# Patient Record
Sex: Male | Born: 1950
Health system: Southern US, Community
[De-identification: ages and names within clinical notes are randomized; demographics above are authoritative.]

## PROBLEM LIST (undated history)

## (undated) DIAGNOSIS — N200 Calculus of kidney: Secondary | ICD-10-CM

## (undated) DIAGNOSIS — K5790 Diverticulosis of intestine, part unspecified, without perforation or abscess without bleeding: Secondary | ICD-10-CM

## (undated) DIAGNOSIS — I499 Cardiac arrhythmia, unspecified: Secondary | ICD-10-CM

## (undated) DIAGNOSIS — Z87442 Personal history of urinary calculi: Secondary | ICD-10-CM

## (undated) DIAGNOSIS — E785 Hyperlipidemia, unspecified: Secondary | ICD-10-CM

## (undated) DIAGNOSIS — I10 Essential (primary) hypertension: Secondary | ICD-10-CM

## (undated) DIAGNOSIS — Z9889 Other specified postprocedural states: Secondary | ICD-10-CM

## (undated) DIAGNOSIS — I509 Heart failure, unspecified: Secondary | ICD-10-CM

## (undated) HISTORY — DX: Diverticulosis of intestine, part unspecified, without perforation or abscess without bleeding: K57.90

## (undated) HISTORY — PX: CARDIAC DEFIBRILLATOR PLACEMENT: SHX171

## (undated) HISTORY — PX: LITHOTRIPSY: SUR834

## (undated) HISTORY — DX: Heart failure, unspecified: I50.9

## (undated) HISTORY — DX: Hyperlipidemia, unspecified: E78.5

## (undated) HISTORY — DX: Calculus of kidney: N20.0

## (undated) HISTORY — PX: OTHER SURGICAL HISTORY: SHX169

## (undated) HISTORY — DX: Essential (primary) hypertension: I10

---

## 1997-11-14 ENCOUNTER — Emergency Department (HOSPITAL_COMMUNITY): Admission: EM | Admit: 1997-11-14 | Discharge: 1997-11-14 | Payer: Self-pay | Admitting: Emergency Medicine

## 2004-09-05 ENCOUNTER — Ambulatory Visit: Payer: Self-pay | Admitting: Internal Medicine

## 2004-10-12 ENCOUNTER — Ambulatory Visit: Payer: Self-pay | Admitting: Internal Medicine

## 2009-05-01 ENCOUNTER — Encounter: Admission: RE | Admit: 2009-05-01 | Discharge: 2009-05-01 | Payer: Self-pay | Admitting: Otolaryngology

## 2011-09-27 ENCOUNTER — Encounter: Payer: Self-pay | Admitting: Internal Medicine

## 2012-07-09 DIAGNOSIS — Z720 Tobacco use: Secondary | ICD-10-CM | POA: Insufficient documentation

## 2012-09-09 ENCOUNTER — Encounter: Payer: Self-pay | Admitting: Internal Medicine

## 2012-12-14 ENCOUNTER — Other Ambulatory Visit: Payer: Self-pay | Admitting: Internal Medicine

## 2012-12-14 ENCOUNTER — Other Ambulatory Visit: Payer: Self-pay | Admitting: Family Medicine

## 2012-12-14 DIAGNOSIS — N1330 Unspecified hydronephrosis: Secondary | ICD-10-CM

## 2012-12-14 DIAGNOSIS — I714 Abdominal aortic aneurysm, without rupture: Secondary | ICD-10-CM

## 2012-12-28 ENCOUNTER — Other Ambulatory Visit: Payer: Self-pay

## 2012-12-29 ENCOUNTER — Other Ambulatory Visit: Payer: Self-pay

## 2016-03-24 ENCOUNTER — Encounter (HOSPITAL_COMMUNITY): Payer: Self-pay

## 2016-03-24 ENCOUNTER — Emergency Department (HOSPITAL_COMMUNITY): Payer: BLUE CROSS/BLUE SHIELD

## 2016-03-24 ENCOUNTER — Emergency Department (HOSPITAL_COMMUNITY)
Admission: EM | Admit: 2016-03-24 | Discharge: 2016-03-24 | Disposition: A | Discharge status: Home / Self Care | Payer: BLUE CROSS/BLUE SHIELD | Attending: Emergency Medicine | Admitting: Emergency Medicine

## 2016-03-24 DIAGNOSIS — I714 Abdominal aortic aneurysm, without rupture, unspecified: Secondary | ICD-10-CM

## 2016-03-24 DIAGNOSIS — N200 Calculus of kidney: Secondary | ICD-10-CM

## 2016-03-24 DIAGNOSIS — K8020 Calculus of gallbladder without cholecystitis without obstruction: Secondary | ICD-10-CM | POA: Diagnosis not present

## 2016-03-24 DIAGNOSIS — F17210 Nicotine dependence, cigarettes, uncomplicated: Secondary | ICD-10-CM | POA: Insufficient documentation

## 2016-03-24 DIAGNOSIS — R1032 Left lower quadrant pain: Secondary | ICD-10-CM

## 2016-03-24 DIAGNOSIS — Z7982 Long term (current) use of aspirin: Secondary | ICD-10-CM | POA: Diagnosis not present

## 2016-03-24 LAB — COMPREHENSIVE METABOLIC PANEL
ALK PHOS: 70 U/L (ref 38–126)
ALT: 14 U/L — ABNORMAL LOW (ref 17–63)
ANION GAP: 9 (ref 5–15)
AST: 13 U/L — ABNORMAL LOW (ref 15–41)
Albumin: 3.5 g/dL (ref 3.5–5.0)
BILIRUBIN TOTAL: 0.4 mg/dL (ref 0.3–1.2)
BUN: 21 mg/dL — ABNORMAL HIGH (ref 6–20)
CALCIUM: 8.5 mg/dL — AB (ref 8.9–10.3)
CO2: 22 mmol/L (ref 22–32)
Chloride: 108 mmol/L (ref 101–111)
Creatinine, Ser: 1.28 mg/dL — ABNORMAL HIGH (ref 0.61–1.24)
GFR, EST NON AFRICAN AMERICAN: 58 mL/min — AB (ref >60)
Glucose, Bld: 100 mg/dL — ABNORMAL HIGH (ref 65–99)
POTASSIUM: 3.7 mmol/L (ref 3.5–5.1)
Sodium: 139 mmol/L (ref 135–145)
TOTAL PROTEIN: 6.9 g/dL (ref 6.5–8.1)

## 2016-03-24 LAB — URINALYSIS, ROUTINE W REFLEX MICROSCOPIC
BILIRUBIN URINE: NEGATIVE
Glucose, UA: NEGATIVE mg/dL
Ketones, ur: NEGATIVE mg/dL
NITRITE: NEGATIVE
Protein, ur: 30 mg/dL — AB
SPECIFIC GRAVITY, URINE: 1.022 (ref 1.005–1.030)
pH: 6.5 (ref 5.0–8.0)

## 2016-03-24 LAB — CBC
HCT: 46.2 % (ref 39.0–52.0)
HEMOGLOBIN: 15.9 g/dL (ref 13.0–17.0)
MCH: 30.6 pg (ref 26.0–34.0)
MCHC: 34.4 g/dL (ref 30.0–36.0)
MCV: 89 fL (ref 78.0–100.0)
Platelets: 295 10*3/uL (ref 150–400)
RBC: 5.19 MIL/uL (ref 4.22–5.81)
RDW: 13.9 % (ref 11.5–15.5)
WBC: 9.3 10*3/uL (ref 4.0–10.5)

## 2016-03-24 LAB — URINE MICROSCOPIC-ADD ON

## 2016-03-24 LAB — LIPASE, BLOOD: Lipase: 37 U/L (ref 11–51)

## 2016-03-24 MED ORDER — HYDROCODONE-ACETAMINOPHEN 5-325 MG PO TABS: 1.0000 | ORAL_TABLET | ORAL | 0 refills | Status: DC | PRN

## 2016-03-24 NOTE — ED Notes (Signed)
Pt is eating in lobby

## 2016-03-24 NOTE — ED Triage Notes (Signed)
Patient complains of left sided abdominal pain since Saturday, nausea with same. States that he has kidney stones and thinks the pain is related to same. Also has diverticulosis so unsure, no dysuria. Alert and oriented.

## 2016-03-24 NOTE — Discharge Instructions (Signed)
Get plenty of rest and drink a lot of fluids.  To avoid constipation; take Colace twice a day while you are using narcotic pain reliever.

## 2016-03-25 ENCOUNTER — Encounter: Payer: Self-pay | Admitting: Surgery

## 2016-03-25 ENCOUNTER — Ambulatory Visit (INDEPENDENT_AMBULATORY_CARE_PROVIDER_SITE_OTHER): Payer: BLUE CROSS/BLUE SHIELD | Admitting: Surgery

## 2016-03-25 ENCOUNTER — Ambulatory Visit (HOSPITAL_COMMUNITY)
Admission: RE | Admit: 2016-03-25 | Discharge: 2016-03-25 | Disposition: A | Discharge status: Home / Self Care | Payer: BLUE CROSS/BLUE SHIELD | Source: Ambulatory Visit | Attending: Surgery | Admitting: Surgery

## 2016-03-25 ENCOUNTER — Telehealth: Payer: Self-pay

## 2016-03-25 VITALS — BP 153/109 | HR 74 | Temp 99.1°F | Resp 16 | Ht 74.0 in | Wt 227.0 lb

## 2016-03-25 DIAGNOSIS — I714 Abdominal aortic aneurysm, without rupture, unspecified: Secondary | ICD-10-CM

## 2016-03-25 DIAGNOSIS — K5730 Diverticulosis of large intestine without perforation or abscess without bleeding: Secondary | ICD-10-CM

## 2016-03-25 DIAGNOSIS — N200 Calculus of kidney: Secondary | ICD-10-CM

## 2016-03-25 DIAGNOSIS — R1032 Left lower quadrant pain: Secondary | ICD-10-CM

## 2016-03-25 DIAGNOSIS — I700 Atherosclerosis of aorta: Secondary | ICD-10-CM | POA: Insufficient documentation

## 2016-03-25 MED ORDER — IOPAMIDOL (ISOVUE-370) INJECTION 76%
100.0000 mL | Freq: Once | INTRAVENOUS | Status: AC | PRN
  Administered 2016-03-25: 100 mL via INTRAVENOUS

## 2016-03-25 MED ORDER — ATORVASTATIN CALCIUM 10 MG PO TABS: 10.0000 mg | ORAL_TABLET | Freq: Every day | ORAL | 5 refills | Status: DC

## 2016-03-25 NOTE — Telephone Encounter (Signed)
rec'd call from pt.  Reported he went to the ER yesterday for abdominal pain and was diagnosed with an enlarged AAA.  Per the pt., the AAA was 3.2 cm, and is now 5.3 cm.  Stated he has had left lower quadrant abdominal pain for 3-4 weeks, and has been taking Tylenol and sleeping on a heating pad.  Stated "I'm tired of being in pain.  Discussed with Dr. Trula Slade.  Recommended to schedule a CTA chest/ abd./ pelvis today and work in to the office schedule.  Pt. Advised of recommendations.  Advised not to eat any solid foods at this time, until after the scan.  Stated he last ate at 7:30 AM  Verb. Understanding.

## 2016-03-25 NOTE — Telephone Encounter (Signed)
Sched CTAs at [employer] at 2:30. Sched [facility] at 4:00. Spoke to pt to inform him of appts.

## 2016-03-25 NOTE — Progress Notes (Signed)
[facility] [place]  Patient name: Anthony Pierce MRN: [id number] DOB: 1950-11-15 Sex: male  REFERRING PHYSICIAN: Dr. Eulis Foster  REASON FOR CONSULT: AAA  HPI: Anthony Pierce is a 65 y.o. male, who is referred today for evaluation of an abdominal aortic aneurysm.  The patient states that he has been having abdominal pain for proximal 7-8 months.  It has gotten worse.  He recently underwent a CT scan to evaluate for a kidney stone and it was found that his aneurysm has increased significantly in size.  Approximately 3 years ago it measured 3.2 cm.  He was seen by vascular surgery and once the [facility] but did not seek follow-up.  Most recent CT scan shows an increase in size up to 5.3 cm.  Patient denies any nausea or vomiting.  The patient has a history of diverticulosis as well as kidney stones.  He is a current smoker but would like to quit.  He denies any symptoms of chest pain or exertional dyspnea.  He denies claudication symptoms.  He has never had a stroke or neurologic symptoms.  Past Medical History:  Diagnosis Date  . Diverticulosis   . Hemorrhoids   . Kidney stones     No family history on file.  SOCIAL HISTORY: Social History   Social History  . Marital status: Single    Spouse name: N/A  . Number of children: N/A  . Years of education: N/A   Occupational History  . Not on file.   Social History Main Topics  . Smoking status: Current Every Day Smoker    Types: Cigarettes  . Smokeless tobacco: Never Used     Comment: 1 pk per day  . Alcohol use Not on file  . Drug use: Unknown  . Sexual activity: Not on file   Other Topics Concern  . Not on file   Social History Narrative  . No narrative on file    Allergies  Allergen Reactions  . Penicillins Rash    Has patient had a PCN reaction causing immediate rash, facial/tongue/throat swelling, SOB or lightheadedness with hypotension: Yes Has patient had a PCN  reaction causing severe rash involving mucus membranes or skin necrosis: No Has patient had a PCN reaction that required hospitalization No Has patient had a PCN reaction occurring within the last 10 years: No If all of the above answers are "NO", then may proceed with Cephalosporin use.     Current Outpatient Prescriptions  Medication Sig Dispense Refill  . acetaminophen (TYLENOL) 500 MG tablet Take 500-1,000 mg by mouth every 6 (six) hours as needed (for pain).    Marland Kitchen aspirin EC 81 MG tablet Take 81 mg by mouth daily.    Marland Kitchen HYDROcodone-acetaminophen (NORCO) 5-325 MG tablet Take 1 tablet by mouth every 4 (four) hours as needed. 20 tablet 0   No current facility-administered medications for this visit.     REVIEW OF SYSTEMS:  [X]  denotes positive finding, [ ]  denotes negative finding Cardiac  Comments:  Chest pain or chest pressure:    Shortness of breath upon exertion:    Short of breath when lying flat:    Irregular heart rhythm:        Vascular    Pain in calf, thigh, or hip brought on by ambulation:    Pain in feet at night that wakes you up from your sleep:     Blood clot in your veins:    Leg swelling:  Pulmonary    Oxygen at home:    Productive cough:     Wheezing:         Neurologic    Sudden weakness in arms or legs:     Sudden numbness in arms or legs:     Sudden onset of difficulty speaking or slurred speech:    Temporary loss of vision in one eye:     Problems with dizziness:         Gastrointestinal    Blood in stool:     Vomited blood:         Genitourinary    Burning when urinating:     Blood in urine:        Psychiatric    Major depression:         Hematologic    Bleeding problems:    Problems with blood clotting too easily:        Skin    Rashes or ulcers:        Constitutional    Fever or chills:      PHYSICAL EXAM: Vitals:   03/25/16 1615 03/25/16 1619  BP: (!) 160/113 (!) 153/109  Pulse: 74   Resp: 16   Temp: 99.1 F (37.3  C)   TempSrc: Oral   SpO2: 97%   Weight: 227 lb (103 kg)   Height: 6\' 2"  (1.88 m)     GENERAL: The patient is a well-nourished male, in no acute distress. The vital signs are documented above. CARDIAC: There is a regular rate and rhythm.  VASCULAR: Pulsatile abdominal mass which is minimally tender.  The tenderness is more inferior and lateral to the prominent pulse of the aneurysm.  He has palpable pedal pulses and no carotid bruits. PULMONARY: There is good air exchange bilaterally without wheezing or rales. ABDOMEN: Soft and non-tender with normal pitched bowel sounds.  MUSCULOSKELETAL: There are no major deformities or cyanosis. NEUROLOGIC: No focal weakness or paresthesias are detected. SKIN: There are no ulcers or rashes noted. PSYCHIATRIC: The patient has a normal affect.  DATA:  I have reviewed his CT scan with the following findings: 1. Ectatic abdominal aorta with fusiform aneurysm of the abdominal aorta just above the bifurcation measuring 5 cm x 5 cm. 2. Iliac arteries are non aneurysmal. 3. Minimal atherosclerotic calcification. Soft intimal plaque and thrombus throughout the abdominal aorta. 4. 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 5. LEFT nephrolithiasis 6. Extensive LEFT colon diverticulosis without diverticulitis.  ASSESSMENT AND PLAN: AAA:  I discussed with the patient that I do not feel his aneurysm is the source of his abdominal pain.  However, because of the significant increase in size over 2 years as well as the patient's anxiety regarding his aneurysm, we have elected to proceed with endovascular repair.  Prior to doing so he will need to have formal cardiac clearance.  I also want him to see urology regarding his renal stone.  He will get preoperative carotid duplex, lower extremity duplex, and ABIs.  I discussed the risks and benefits of the operation which include but are not limited to death, cardiac, and  complications, intestinal ischemia, renal insufficiency, distal lower extremity ischemia.  All of his questions were answered.  We have placed on the schedule for Wednesday, November 8  Because his vascular disease, I'm starting the patient on a statin today.  I will call in a prescription for Lipitor 10 mg.  He'll also be encouraged  to take a baby aspirin daily and discontinue smoking.   Annamarie Major, MD [facility] [phone] Pager [phone]

## 2016-03-26 ENCOUNTER — Emergency Department (HOSPITAL_COMMUNITY): Payer: BLUE CROSS/BLUE SHIELD

## 2016-03-26 ENCOUNTER — Other Ambulatory Visit: Payer: Self-pay | Admitting: *Deleted

## 2016-03-26 ENCOUNTER — Telehealth: Payer: Self-pay

## 2016-03-26 ENCOUNTER — Inpatient Hospital Stay (HOSPITAL_COMMUNITY)
Admission: EM | Admit: 2016-03-26 | Discharge: 2016-03-28 | DRG: 269 | Disposition: A | Discharge status: Home / Self Care | Payer: BLUE CROSS/BLUE SHIELD | Attending: Surgery | Admitting: Surgery

## 2016-03-26 ENCOUNTER — Encounter (HOSPITAL_COMMUNITY): Payer: Self-pay | Admitting: *Deleted

## 2016-03-26 ENCOUNTER — Other Ambulatory Visit: Payer: Self-pay

## 2016-03-26 DIAGNOSIS — N23 Unspecified renal colic: Secondary | ICD-10-CM

## 2016-03-26 DIAGNOSIS — I714 Abdominal aortic aneurysm, without rupture, unspecified: Secondary | ICD-10-CM | POA: Diagnosis present

## 2016-03-26 DIAGNOSIS — Z8679 Personal history of other diseases of the circulatory system: Secondary | ICD-10-CM

## 2016-03-26 DIAGNOSIS — N390 Urinary tract infection, site not specified: Secondary | ICD-10-CM | POA: Diagnosis present

## 2016-03-26 DIAGNOSIS — K5792 Diverticulitis of intestine, part unspecified, without perforation or abscess without bleeding: Secondary | ICD-10-CM | POA: Diagnosis present

## 2016-03-26 DIAGNOSIS — R109 Unspecified abdominal pain: Secondary | ICD-10-CM | POA: Diagnosis not present

## 2016-03-26 DIAGNOSIS — Z9889 Other specified postprocedural states: Secondary | ICD-10-CM

## 2016-03-26 DIAGNOSIS — T508X5A Adverse effect of diagnostic agents, initial encounter: Secondary | ICD-10-CM | POA: Diagnosis not present

## 2016-03-26 DIAGNOSIS — N179 Acute kidney failure, unspecified: Secondary | ICD-10-CM | POA: Diagnosis not present

## 2016-03-26 DIAGNOSIS — N200 Calculus of kidney: Secondary | ICD-10-CM | POA: Diagnosis present

## 2016-03-26 DIAGNOSIS — F17200 Nicotine dependence, unspecified, uncomplicated: Secondary | ICD-10-CM | POA: Diagnosis present

## 2016-03-26 LAB — COMPREHENSIVE METABOLIC PANEL
ALBUMIN: 3.7 g/dL (ref 3.5–5.0)
ALT: 12 U/L — ABNORMAL LOW (ref 17–63)
ANION GAP: 8 (ref 5–15)
AST: 16 U/L (ref 15–41)
Alkaline Phosphatase: 65 U/L (ref 38–126)
BUN: 17 mg/dL (ref 6–20)
CALCIUM: 8.8 mg/dL — AB (ref 8.9–10.3)
CO2: 22 mmol/L (ref 22–32)
Chloride: 107 mmol/L (ref 101–111)
Creatinine, Ser: 1.14 mg/dL (ref 0.61–1.24)
GFR calc non Af Amer: >60 mL/min (ref >60)
GLUCOSE: 152 mg/dL — AB (ref 65–99)
POTASSIUM: 4.1 mmol/L (ref 3.5–5.1)
SODIUM: 137 mmol/L (ref 135–145)
TOTAL PROTEIN: 6.8 g/dL (ref 6.5–8.1)
Total Bilirubin: 0.5 mg/dL (ref 0.3–1.2)

## 2016-03-26 LAB — CBC WITH DIFFERENTIAL/PLATELET
BASOS ABS: 0 10*3/uL (ref 0.0–0.1)
BASOS PCT: 0 %
Eosinophils Absolute: 0 10*3/uL (ref 0.0–0.7)
Eosinophils Relative: 0 %
HEMATOCRIT: 45.7 % (ref 39.0–52.0)
HEMOGLOBIN: 15.8 g/dL (ref 13.0–17.0)
LYMPHS PCT: 14 %
Lymphs Abs: 1.8 10*3/uL (ref 0.7–4.0)
MCH: 30.5 pg (ref 26.0–34.0)
MCHC: 34.6 g/dL (ref 30.0–36.0)
MCV: 88.2 fL (ref 78.0–100.0)
MONO ABS: 0.5 10*3/uL (ref 0.1–1.0)
MONOS PCT: 4 %
NEUTROS ABS: 10.1 10*3/uL — AB (ref 1.7–7.7)
NEUTROS PCT: 82 %
Platelets: 253 10*3/uL (ref 150–400)
RBC: 5.18 MIL/uL (ref 4.22–5.81)
RDW: 13.8 % (ref 11.5–15.5)
WBC: 12.4 10*3/uL — ABNORMAL HIGH (ref 4.0–10.5)

## 2016-03-26 LAB — LIPASE, BLOOD: Lipase: 70 U/L — ABNORMAL HIGH (ref 11–51)

## 2016-03-26 LAB — URINALYSIS, ROUTINE W REFLEX MICROSCOPIC
Bilirubin Urine: NEGATIVE
GLUCOSE, UA: NEGATIVE mg/dL
Ketones, ur: NEGATIVE mg/dL
Nitrite: NEGATIVE
Protein, ur: NEGATIVE mg/dL
SPECIFIC GRAVITY, URINE: 1.008 (ref 1.005–1.030)
pH: 6 (ref 5.0–8.0)

## 2016-03-26 LAB — URINE MICROSCOPIC-ADD ON

## 2016-03-26 MED ORDER — MORPHINE SULFATE (PF) 4 MG/ML IV SOLN
4.0000 mg | Freq: Once | INTRAVENOUS | Status: AC
Start: 2016-03-26 — End: 2016-03-26
  Administered 2016-03-26: 4 mg via INTRAVENOUS
  Filled 2016-03-26: qty 1

## 2016-03-26 NOTE — ED Notes (Signed)
Patient ambulated to BR with even, steady gait for urine sample.

## 2016-03-26 NOTE — Consult Note (Signed)
Consult Note  Patient name: Anthony Pierce MRN: [id number] DOB: 10/23/1950 Sex: male  Consulting Physician:  ER  Reason for Consult:  Chief Complaint  Patient presents with  . Abdominal Pain    HISTORY OF PRESENT ILLNESS: 65 year old male who I saw in the office yesterday for a AAA.Marland Kitchen  He called the office today with worsening abdominal pain and was told to goto the ER.  He took a pain pill and the pain improved somewhat. He saw urology today who doesn't think his symptoms are related to his renal stone.  He does have a history of diverticulitis.  He recently underwent a CT scan to evaluate for a kidney stone and it was found that his aneurysm has increased significantly in size.  Approximately 3 years ago it measured 3.2 cm.  He was seen by vascular surgery and once the [facility] but did not seek follow-up.  Most recent CT scan shows an increase in size up to 5.3 cm.  I started him on a statin yesterday.  He continues to smoke  Past Medical History:  Diagnosis Date  . Diverticulosis   . Hemorrhoids   . Kidney stones     History reviewed. No pertinent surgical history.  Social History   Social History  . Marital status: Single    Spouse name: N/A  . Number of children: N/A  . Years of education: N/A   Occupational History  . Not on file.   Social History Main Topics  . Smoking status: Current Every Day Smoker    Types: Cigarettes  . Smokeless tobacco: Never Used     Comment: 1 pk per day  . Alcohol use Not on file  . Drug use: Unknown  . Sexual activity: Not on file   Other Topics Concern  . Not on file   Social History Narrative  . No narrative on file    History reviewed. No pertinent family history.  Allergies as of 03/26/2016 - Review Complete 03/26/2016  Allergen Reaction Noted  . Penicillins Rash 03/24/2016    No current facility-administered medications on file prior to encounter.    Current Outpatient Prescriptions on File Prior to Encounter    Medication Sig Dispense Refill  . acetaminophen (TYLENOL) 500 MG tablet Take 500-1,000 mg by mouth every 6 (six) hours as needed (for pain).    Marland Kitchen aspirin EC 81 MG tablet Take 81 mg by mouth daily.    Marland Kitchen atorvastatin (LIPITOR) 10 MG tablet Take 1 tablet (10 mg total) by mouth daily. 30 tablet 5  . HYDROcodone-acetaminophen (NORCO) 5-325 MG tablet Take 1 tablet by mouth every 4 (four) hours as needed. 20 tablet 0     REVIEW OF SYSTEMS: Cardiovascular: No chest pain, chest pressure, palpitations, orthopnea, or dyspnea on exertion. No claudication or rest pain,  No history of DVT or phlebitis. Pulmonary: No productive cough, asthma or wheezing. Neurologic: No weakness, paresthesias, aphasia, or amaurosis. No dizziness. Hematologic: No bleeding problems or clotting disorders. Musculoskeletal: No joint pain or joint swelling. Gastrointestinal: No blood in stool or hematemesis.  History of diverticulitis Genitourinary: history of renal stones since age 65 Psychiatric:: No history of major depression. Integumentary: No rashes or ulcers. Constitutional: No fever or chills.  PHYSICAL EXAMINATION: General: The patient appears their stated age.  Vital signs are BP (!) 161/107   Pulse 81   Temp 98.1 F (36.7 C) (Oral)   Resp 15   SpO2 97%  Pulmonary: Respirations are non-labored HEENT:  No gross abnormalities Abdomen: tender in left lower quadrant.  Palpation of most of his aorta does is not painful, except in the left lower quadrant Musculoskeletal: There are no major deformities.   Neurologic: No focal weakness or paresthesias are detected, Skin: There are no ulcer or rashes noted. Psychiatric: The patient has normal affect. Cardiovascular: There is a regular rate and rhythm without significant murmur appreciated.  Palpable pedal pulses  Diagnostic Studies: A non-contrast CT scan was done which I discussed with radiology.  There has been no change in his aoric size, and there is no  peri-aortic stranding.  He does have a large proximal renal stone with mild ureteral dialtion    Assessment:  AAA Plan: I do not believe his abdominal pain is related to his AAA as he is not tender over his aneurysm except in his left lower quadrant.  His CT scan does not show diverticulitis.  I am concerned that his pain may be related to his stone, but urology earlier today did not feel that was the case.  He will probably need to have his aneurysm repaired  During this hospital stay if nothing else because of the size, and to take it out of the equation for the cause of his abdominal pain.  He will need to have a UA to make sure he does not have any evidence of infection.  I discussed the details of the repair with him yesterday in the office.  I would recommend admission by medicine for abx and monitoring.  Keep him NPO after midnight in case repair of his AAA is scheduled for tomorrow     V. Leia Alf, M.D. [facility] [place] Office: [phone] Pager:  [phone]

## 2016-03-26 NOTE — Telephone Encounter (Signed)
Phone call from pt.  Reported onset of left sided abdominal pain from stomach region down to genital region.  Reported pain at 10/10.  C/o nausea and breaking out in cold sweat.  Denied back pain.  Reported he took a pain pill about 30 minutes ago, and pain level has decreased to 8-9/10.   Pt. Stated he spoke with a Urologist and was told he did not think the pain was related to kidney stone.  Advised pt. With known AAA, he should go to the ER.  Advised that a family member should drive him.  Pt. Reported he will call his daughter to drive him.  Advised that this nurse will make Dr. Trula Slade aware of pt. Going to the ER.

## 2016-03-26 NOTE — ED Triage Notes (Signed)
Pt c/o sudden sharp left side abdominal pain this afternoon starting around 1600. Pt was recent dx with kidney stones and AAA. Pt took a Norco about 30 min prior to ED arrival. Pt did have some relief. Pt denies dysuria or painful urination.

## 2016-03-26 NOTE — ED Provider Notes (Signed)
[facility] DEPT Provider Note   CSN: [id number] Arrival date & time: 03/26/16  1748     History   Chief Complaint Chief Complaint  Patient presents with  . Abdominal Pain    HPI Anthony Pierce is a 65 y.o. male.Complains of left-sided abdominal pain radiating to left testicle onset 7 or 8 months ago, becoming worse 2 days ago. Pain is worse with changing positions improved with remaining still.Marland Kitchen He was evaluated in the emergency department at [facility] 2 days ago, determined to have bilateral nephrolithiasis and 5.3 cm infrarenal aortic aneurysm  As,, as well as cholelithiasis. Discharged with prescription for Norco. He took one earlier today with partial relief. No other associated symptoms. No nausea or vomiting no fever no other associated symptoms at seen by Dr. Trula Slade yesterday and again today. Concern for abdominal aortic aneurysm. Dr. Angelica Pou request repeat CT scan and lab work area and HPI  Past Medical History:  Diagnosis Date  . Diverticulosis   . Hemorrhoids   . Kidney stones   Aortic aneurysm   There are no active problems to display for this patient.   History reviewed. No pertinent surgical history.     Home Medications    Prior to Admission medications   Medication Sig Start Date End Date Taking? Authorizing Provider  acetaminophen (TYLENOL) 500 MG tablet Take 500-1,000 mg by mouth every 6 (six) hours as needed (for pain).    Historical Provider, MD  aspirin EC 81 MG tablet Take 81 mg by mouth daily.    Historical Provider, MD  atorvastatin (LIPITOR) 10 MG tablet Take 1 tablet (10 mg total) by mouth daily. 03/25/16   Serafina Mitchell, MD  HYDROcodone-acetaminophen (NORCO) 5-325 MG tablet Take 1 tablet by mouth every 4 (four) hours as needed. 03/24/16   Daleen Bo, MD    Family History History reviewed. No pertinent family history.  Social History Social History  Substance Use Topics  . Smoking status: Current Every Day Smoker   Types: Cigarettes  . Smokeless tobacco: Never Used     Comment: 1 pk per day  . Alcohol use Not on file     Allergies   Penicillins   Review of Systems Review of Systems  Constitutional: Negative.   HENT: Negative.   Respiratory: Negative.   Gastrointestinal: Positive for abdominal pain.  Genitourinary: Positive for testicular pain.  Musculoskeletal: Negative.   Skin: Negative.   Allergic/Immunologic: Negative.   Neurological: Negative.   Psychiatric/Behavioral: Negative.   All other systems reviewed and are negative.    Physical Exam Updated Vital Signs BP (!) 161/107   Pulse 81   Temp 98.1 F (36.7 C) (Oral)   Resp 15   SpO2 97%   Physical Exam  Constitutional: He appears well-developed and well-nourished.  HENT:  Head: Normocephalic and atraumatic.  Eyes: Conjunctivae are normal. Pupils are equal, round, and reactive to light.  Neck: Neck supple. No tracheal deviation present. No thyromegaly present.  Cardiovascular: Normal rate and regular rhythm.   No murmur heard. Pulmonary/Chest: Effort normal and breath sounds normal.  Abdominal: Soft. Bowel sounds are normal. He exhibits no distension and no mass. There is tenderness. There is no rebound. No hernia.  Mildly tender left lower quadrant  Genitourinary: Penis normal.  Genitourinary Comments: Scrotum normal  Musculoskeletal: Normal range of motion. He exhibits no edema or tenderness.  Neurological: He is alert. Coordination normal.  Skin: Skin is warm and dry. No rash noted.  Psychiatric: He has a  normal mood and affect.  Nursing note and vitals reviewed.    ED Treatments / Results  Labs (all labs ordered are listed, but only abnormal results are displayed) Labs Reviewed  COMPREHENSIVE METABOLIC PANEL  CBC WITH DIFFERENTIAL/PLATELET  LIPASE, BLOOD    EKG  EKG Interpretation  Date/Time:  Tuesday March 26 2016 23:37:48 EDT Ventricular Rate:  66 PR Interval:    QRS Duration: 95 QT  Interval:  393 QTC Calculation: 412 R Axis:   52 Text Interpretation:  Sinus rhythm Repol abnrm suggests ischemia, anterolateral No old tracing to compare Confirmed by Winfred Leeds  MD, Brieanne Mignone [phone]) on 03/26/2016 11:41:33 PM      Results for orders placed or performed during the hospital encounter of 03/26/16  Comprehensive metabolic panel  Result Value Ref Range   Sodium 137 135 - 145 mmol/L   Potassium 4.1 3.5 - 5.1 mmol/L   Chloride 107 101 - 111 mmol/L   CO2 22 22 - 32 mmol/L   Glucose, Bld 152 (H) 65 - 99 mg/dL   BUN 17 6 - 20 mg/dL   Creatinine, Ser 1.14 0.61 - 1.24 mg/dL   Calcium 8.8 (L) 8.9 - 10.3 mg/dL   Total Protein 6.8 6.5 - 8.1 g/dL   Albumin 3.7 3.5 - 5.0 g/dL   AST 16 15 - 41 U/L   ALT 12 (L) 17 - 63 U/L   Alkaline Phosphatase 65 38 - 126 U/L   Total Bilirubin 0.5 0.3 - 1.2 mg/dL   GFR calc non Af Amer >60 >60 mL/min   GFR calc Af Amer >60 >60 mL/min   Anion gap 8 5 - 15  CBC with Differential/Platelet  Result Value Ref Range   WBC 12.4 (H) 4.0 - 10.5 K/uL   RBC 5.18 4.22 - 5.81 MIL/uL   Hemoglobin 15.8 13.0 - 17.0 g/dL   HCT 45.7 39.0 - 52.0 %   MCV 88.2 78.0 - 100.0 fL   MCH 30.5 26.0 - 34.0 pg   MCHC 34.6 30.0 - 36.0 g/dL   RDW 13.8 11.5 - 15.5 %   Platelets 253 150 - 400 K/uL   Neutrophils Relative % 82 %   Neutro Abs 10.1 (H) 1.7 - 7.7 K/uL   Lymphocytes Relative 14 %   Lymphs Abs 1.8 0.7 - 4.0 K/uL   Monocytes Relative 4 %   Monocytes Absolute 0.5 0.1 - 1.0 K/uL   Eosinophils Relative 0 %   Eosinophils Absolute 0.0 0.0 - 0.7 K/uL   Basophils Relative 0 %   Basophils Absolute 0.0 0.0 - 0.1 K/uL  Lipase, blood  Result Value Ref Range   Lipase 70 (H) 11 - 51 U/L  Urinalysis, Routine w reflex microscopic (not at [facility])  Result Value Ref Range   Color, Urine YELLOW YELLOW   APPearance CLEAR CLEAR   Specific Gravity, Urine 1.008 1.005 - 1.030   pH 6.0 5.0 - 8.0   Glucose, UA NEGATIVE NEGATIVE mg/dL   Hgb urine dipstick MODERATE (A) NEGATIVE    Bilirubin Urine NEGATIVE NEGATIVE   Ketones, ur NEGATIVE NEGATIVE mg/dL   Protein, ur NEGATIVE NEGATIVE mg/dL   Nitrite NEGATIVE NEGATIVE   Leukocytes, UA SMALL (A) NEGATIVE  Urine microscopic-add on  Result Value Ref Range   Squamous Epithelial / LPF 0-5 (A) NONE SEEN   WBC, UA 6-30 0 - 5 WBC/hpf   RBC / HPF 6-30 0 - 5 RBC/hpf   Bacteria, UA RARE (A) NONE SEEN   Ct Abdomen Pelvis Wo  Contrast  Result Date: 03/26/2016 CLINICAL DATA:  65 year old male with left-sided abdominal and pelvic pain. EXAM: CT ABDOMEN AND PELVIS WITHOUT CONTRAST TECHNIQUE: Multidetector CT imaging of the abdomen and pelvis was performed following the standard protocol without IV contrast. COMPARISON:  CT dated 03/25/2016 and 03/24/2016 FINDINGS: Evaluation of this exam is limited in the absence of intravenous contrast. Lower chest: The visualized lung bases are clear. No intra-abdominal free air or free fluid. Hepatobiliary: The liver is unremarkable. The gallbladder is filled with stones. There is no pericholecystic fluid. No biliary ductal dilatation. No calcified stone noted within the CBD. Ultrasound may provide better evaluation of the gallbladder clinically indicated. Pancreas: Unremarkable. No pancreatic ductal dilatation or surrounding inflammatory changes. Spleen: Normal in size without focal abnormality. Adrenals/Urinary Tract: The adrenal glands appear unremarkable. There is a 12 x 14 mm partially obstructing stone in the left renal pelvis with mild left hydronephrosis. There is mild haziness of the left renal hilar fat. Correlation with urinalysis recommended to evaluate for superimposed UTI. Multiple other nonobstructing left renal stones measuring up to 10 mm in the interpolar aspect of the left kidney noted. There are two nonobstructing stones in the right kidney measuring up to 9 mm. There is no hydronephrosis on the right. Two adjacent hypodense lesions in the inferior pole of the right kidney are not well  characterized on this noncontrast study but appears similar to prior CT and most likely represent cysts. The visualized ureters appear unremarkable. The urinary bladder is predominantly collapsed. There is apparent diffuse thickening of the bladder wall which may be partly related to underdistention. Cystitis is not excluded. Correlation with urinalysis recommended. Stomach/Bowel: There is extensive sigmoid diverticulosis with muscular hypertrophy. Diffuse diverticulosis of the descending colon. No active inflammatory changes. There is no evidence of bowel obstruction for active inflammation. Normal appendix. Vascular/Lymphatic: The aorta is tortuous. Sign type of There is a fusiform infrarenal abdominal aortic aneurysm measuring 5.3 cm in greatest axial diameter. This aneurysm measures similar in size to the study of 03/25/2016 and 03/24/2016 by my measurement. The aneurysm measures approximately 7-8 cm in craniocaudal length and extends to the level of the bifurcation. There is mural thrombus/hematoma within the wall of the aneurysm. There is no periaortic stranding or inflammatory changes. Evaluation of the aorta and vasculature is however limited on this noncontrast study. No portal venous gas identified. There is no adenopathy. Reproductive: The prostate and seminal vesicles are grossly unremarkable. Other: Small fat containing umbilical hernia. Musculoskeletal: There bilateral L5 pars defects with grade I L5-S1 anterolisthesis. L5-S1 disc desiccation with vacuum phenomena. No acute fracture. IMPRESSION: Obstructing calculus in the left renal pelvis with mild left hydronephrosis. Correlation with urinalysis recommended to exclude superimposed UTI. Multiple no other nonobstructing bilateral renal calculi noted. There is no hydronephrosis on the right. Fusiform infrarenal abdominal aortic aneurysm measuring 5.3 cm in greatest transverse axial dimension similar to the prior CT. There is mural thrombus or hematoma  along the wall of the aneurysm. No periaortic stranding or evidence of inflammatory changes identified by CT. Evaluation however is limited in the absence of intravenous contrast. Cholelithiasis. Extensive distal colonic diverticulosis without active inflammatory changes. No evidence of bowel obstruction. Normal appendix. These results were called by telephone at the time of interpretation on 03/26/2016 at 10:06 pm to Dr. Trula Slade, who verbally acknowledged these results. Electronically Signed   By: Anner Crete M.D.   On: 03/26/2016 22:35   Ct Angio Chest Aorta W &/or Wo Contrast  Result Date: 03/25/2016  CLINICAL DATA:  Abdominal aortic aneurysm. LEFT lower quadrant pain. EXAM: CT ANGIOGRAPHY CHEST, ABDOMEN AND PELVIS TECHNIQUE: Multidetector CT imaging through the chest, abdomen and pelvis was performed using the standard protocol during bolus administration of intravenous contrast. Multiplanar reconstructed images and MIPs were obtained and reviewed to evaluate the vascular anatomy. CONTRAST:  100 mL Isovue 370 COMPARISON:  CT 03/24/2016 FINDINGS: CTA CHEST FINDINGS Cardiovascular: Noncontrast imaging was demonstrate no intramural hematoma. Contrast-enhanced atrial phase imaging demonstrates mild dilatation ascending aorta to 42 mm. Great vessels are normal. Minimal atherosclerotic calcification aortic arch no atherosclerotic calcification aortic arch. No pulmonary embolism. No pericardial fluid. Mediastinum/Nodes: No axillary supraclavicular adenopathy. No mediastinal hilar adenopathy Lungs/Pleura: No suspicious pulmonary nodularity. No pulmonary edema or pleural fluid. Musculoskeletal: No aggressive osseous lesion Review of the MIP images confirms the above findings. CTA ABDOMEN AND PELVIS FINDINGS VASCULAR Aorta: The abdominal aorta is ectatic. There is fusiform dilatation of the infrarenal abdominal aorta just above the bifurcation measuring 51 x 51 mm in axial dimension and 51 mm in sagittal plane.  No evidence of aortic dissection. The majority of the aneurysm sac is thrombosed. More superiorly, there is soft intimal plaque formation along the aorta. The celiac trunk and SMA are widely patent. Inferior mesenteric artery is patent Celiac: Normal SMA: Normal Renals: There are 2 renal arteries on the RIGHT there is ostial narrowing of the more superior dominant renal artery (image 83, series 9). Two renal arteries on the LEFT. There is likewise mild fossa narrowing of ostia the LEFT renal artery. This ostial renal artery narrowing is favored related to plaque in the aorta. Bilateral renal cortical thinning. Kidneys enhance symmetrically IMA: Patent Inflow: The iliac arteries are non aneurysmal. The common iliac arteries measure 1 cm in diameter each. Veins: Normal Review of the MIP images confirms the above findings. NON-VASCULAR Hepatobiliary: No focal hepatic lesion.  Multiple gallstones Pancreas: No inflammation or duct dilatation Spleen: Normal spleen Adrenals/Urinary Tract: Adrenal glands are normal. There is bilateral renal cortical thinning. There is a coarse calcification in the LEFT renal pelvis measuring 1.4 cm. Nonenhancing cyst of the RIGHT kidney Stomach/Bowel: Stomach, small bowel, appendix, and cecum are normal. Several diverticula of the descending colon and multiple diverticula sigmoid rectum normal Lymphatic: Retroperitoneal pelvic lymphadenopathy Reproductive: Prostate normal Other: No inguinal hernia.  No ventral hernia. Musculoskeletal: No aggressive osseous lesion. Review of the MIP images confirms the above findings. IMPRESSION: Chest Impression: 1. No evidence of acute aneurysm or dissection of thoracic aorta. 2. Mild dilatation of the ascending aorta. Abdomen / Pelvis Impression: 1. Ectatic abdominal aorta with fusiform aneurysm of the abdominal aorta just above the bifurcation measuring 5 cm x 5 cm. 2. Iliac arteries are non aneurysmal. 3. Minimal atherosclerotic calcification. Soft  intimal plaque and thrombus throughout the abdominal aorta. 4. 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 5. LEFT nephrolithiasis 6. Extensive LEFT colon diverticulosis without diverticulitis. Electronically Signed   By: Suzy Bouchard M.D.   On: 03/25/2016 15:50   Ct Renal Stone Study  Result Date: 03/24/2016 CLINICAL DATA:  Left lower quadrant pain. History of kidney stones. Painful urination. EXAM: CT ABDOMEN AND PELVIS WITHOUT CONTRAST TECHNIQUE: Multidetector CT imaging of the abdomen and pelvis was performed following the standard protocol without IV contrast. COMPARISON:  None. FINDINGS: Lower chest: No acute abnormality. Hepatobiliary: No focal liver abnormality is seen. Multiple large stones fill the gallbladder without evidence of gallbladder wall thickening or pericholecystic inflammatory change. No  biliary dilatation is seen. Pancreas: Unremarkable. Spleen: Unremarkable. Adrenals/Urinary Tract: Unremarkable adrenal glands. Stones in the interpolar region and lower pole of the right kidney measure up to 8 mm in size. There are a greater number of stones in the left kidney, including a 1.5 cm stone in the renal pelvis. There is mild fat stranding about the left renal pelvis and proximal ureter without significant hydronephrosis. No ureteral calculi or ureteral dilatation is seen. The bladder is largely decompressed. Low-density lesions in the right interpolar kidney and left upper pole measure 2.0 cm and 1.3 cm, respectively, most likely cysts. Stomach/Bowel: The stomach is within normal limits. There is no evidence of bowel obstruction. Left-sided colonic diverticulosis is noted without evidence of diverticulitis. The appendix is unremarkable. Vascular/Lymphatic: Infrarenal abdominal aortic aneurysm measures 5.3 x 5.2 cm. There is mild aortoiliac atherosclerosis. No enlarged lymph nodes are identified. Reproductive: Slight prostatic enlargement. Other: No  intraperitoneal free fluid. No abdominal wall mass or hernia. Musculoskeletal: Bilateral L5 pars defects with grade 1 anterolisthesis measuring 6 mm. Trace retrolisthesis of L4 on L5. Severe disc space narrowing at L5-S1 with vacuum disc. IMPRESSION: 1. Bilateral nephrolithiasis including a 1.5 cm stone in the left renal pelvis. No significant hydronephrosis. 2. 5.3 cm infrarenal abdominal aortic aneurysm. Recommend followup by abdomen and pelvis CTA in 3-6 months, and vascular surgery referral/consultation if not already obtained. This recommendation follows ACR consensus guidelines: White Paper of the ACR Incidental Findings Committee II on Vascular Findings. J Am Coll Radiol 2013; 10:789-794. 3. Cholelithiasis. Electronically Signed   By: Logan Bores M.D.   On: 03/24/2016 21:57   Ct Angio Abdomen Pelvis  W &/or Wo Contrast  Result Date: 03/25/2016 CLINICAL DATA:  Abdominal aortic aneurysm. LEFT lower quadrant pain. EXAM: CT ANGIOGRAPHY CHEST, ABDOMEN AND PELVIS TECHNIQUE: Multidetector CT imaging through the chest, abdomen and pelvis was performed using the standard protocol during bolus administration of intravenous contrast. Multiplanar reconstructed images and MIPs were obtained and reviewed to evaluate the vascular anatomy. CONTRAST:  100 mL Isovue 370 COMPARISON:  CT 03/24/2016 FINDINGS: CTA CHEST FINDINGS Cardiovascular: Noncontrast imaging was demonstrate no intramural hematoma. Contrast-enhanced atrial phase imaging demonstrates mild dilatation ascending aorta to 42 mm. Great vessels are normal. Minimal atherosclerotic calcification aortic arch no atherosclerotic calcification aortic arch. No pulmonary embolism. No pericardial fluid. Mediastinum/Nodes: No axillary supraclavicular adenopathy. No mediastinal hilar adenopathy Lungs/Pleura: No suspicious pulmonary nodularity. No pulmonary edema or pleural fluid. Musculoskeletal: No aggressive osseous lesion Review of the MIP images confirms the above  findings. CTA ABDOMEN AND PELVIS FINDINGS VASCULAR Aorta: The abdominal aorta is ectatic. There is fusiform dilatation of the infrarenal abdominal aorta just above the bifurcation measuring 51 x 51 mm in axial dimension and 51 mm in sagittal plane. No evidence of aortic dissection. The majority of the aneurysm sac is thrombosed. More superiorly, there is soft intimal plaque formation along the aorta. The celiac trunk and SMA are widely patent. Inferior mesenteric artery is patent Celiac: Normal SMA: Normal Renals: There are 2 renal arteries on the RIGHT there is ostial narrowing of the more superior dominant renal artery (image 83, series 9). Two renal arteries on the LEFT. There is likewise mild fossa narrowing of ostia the LEFT renal artery. This ostial renal artery narrowing is favored related to plaque in the aorta. Bilateral renal cortical thinning. Kidneys enhance symmetrically IMA: Patent Inflow: The iliac arteries are non aneurysmal. The common iliac arteries measure 1 cm in diameter each. Veins: Normal Review of the MIP images  confirms the above findings. NON-VASCULAR Hepatobiliary: No focal hepatic lesion.  Multiple gallstones Pancreas: No inflammation or duct dilatation Spleen: Normal spleen Adrenals/Urinary Tract: Adrenal glands are normal. There is bilateral renal cortical thinning. There is a coarse calcification in the LEFT renal pelvis measuring 1.4 cm. Nonenhancing cyst of the RIGHT kidney Stomach/Bowel: Stomach, small bowel, appendix, and cecum are normal. Several diverticula of the descending colon and multiple diverticula sigmoid rectum normal Lymphatic: Retroperitoneal pelvic lymphadenopathy Reproductive: Prostate normal Other: No inguinal hernia.  No ventral hernia. Musculoskeletal: No aggressive osseous lesion. Review of the MIP images confirms the above findings. IMPRESSION: Chest Impression: 1. No evidence of acute aneurysm or dissection of thoracic aorta. 2. Mild dilatation of the ascending  aorta. Abdomen / Pelvis Impression: 1. Ectatic abdominal aorta with fusiform aneurysm of the abdominal aorta just above the bifurcation measuring 5 cm x 5 cm. 2. Iliac arteries are non aneurysmal. 3. Minimal atherosclerotic calcification. Soft intimal plaque and thrombus throughout the abdominal aorta. 4. 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 5. LEFT nephrolithiasis 6. Extensive LEFT colon diverticulosis without diverticulitis. Electronically Signed   By: Suzy Bouchard M.D.   On: 03/25/2016 15:50    Radiology Ct Angio Chest Aorta W &/or Wo Contrast  Result Date: 03/25/2016 CLINICAL DATA:  Abdominal aortic aneurysm. LEFT lower quadrant pain. EXAM: CT ANGIOGRAPHY CHEST, ABDOMEN AND PELVIS TECHNIQUE: Multidetector CT imaging through the chest, abdomen and pelvis was performed using the standard protocol during bolus administration of intravenous contrast. Multiplanar reconstructed images and MIPs were obtained and reviewed to evaluate the vascular anatomy. CONTRAST:  100 mL Isovue 370 COMPARISON:  CT 03/24/2016 FINDINGS: CTA CHEST FINDINGS Cardiovascular: Noncontrast imaging was demonstrate no intramural hematoma. Contrast-enhanced atrial phase imaging demonstrates mild dilatation ascending aorta to 42 mm. Great vessels are normal. Minimal atherosclerotic calcification aortic arch no atherosclerotic calcification aortic arch. No pulmonary embolism. No pericardial fluid. Mediastinum/Nodes: No axillary supraclavicular adenopathy. No mediastinal hilar adenopathy Lungs/Pleura: No suspicious pulmonary nodularity. No pulmonary edema or pleural fluid. Musculoskeletal: No aggressive osseous lesion Review of the MIP images confirms the above findings. CTA ABDOMEN AND PELVIS FINDINGS VASCULAR Aorta: The abdominal aorta is ectatic. There is fusiform dilatation of the infrarenal abdominal aorta just above the bifurcation measuring 51 x 51 mm in axial dimension and 51 mm in  sagittal plane. No evidence of aortic dissection. The majority of the aneurysm sac is thrombosed. More superiorly, there is soft intimal plaque formation along the aorta. The celiac trunk and SMA are widely patent. Inferior mesenteric artery is patent Celiac: Normal SMA: Normal Renals: There are 2 renal arteries on the RIGHT there is ostial narrowing of the more superior dominant renal artery (image 83, series 9). Two renal arteries on the LEFT. There is likewise mild fossa narrowing of ostia the LEFT renal artery. This ostial renal artery narrowing is favored related to plaque in the aorta. Bilateral renal cortical thinning. Kidneys enhance symmetrically IMA: Patent Inflow: The iliac arteries are non aneurysmal. The common iliac arteries measure 1 cm in diameter each. Veins: Normal Review of the MIP images confirms the above findings. NON-VASCULAR Hepatobiliary: No focal hepatic lesion.  Multiple gallstones Pancreas: No inflammation or duct dilatation Spleen: Normal spleen Adrenals/Urinary Tract: Adrenal glands are normal. There is bilateral renal cortical thinning. There is a coarse calcification in the LEFT renal pelvis measuring 1.4 cm. Nonenhancing cyst of the RIGHT kidney Stomach/Bowel: Stomach, small bowel, appendix, and cecum are normal. Several diverticula of  the descending colon and multiple diverticula sigmoid rectum normal Lymphatic: Retroperitoneal pelvic lymphadenopathy Reproductive: Prostate normal Other: No inguinal hernia.  No ventral hernia. Musculoskeletal: No aggressive osseous lesion. Review of the MIP images confirms the above findings. IMPRESSION: Chest Impression: 1. No evidence of acute aneurysm or dissection of thoracic aorta. 2. Mild dilatation of the ascending aorta. Abdomen / Pelvis Impression: 1. Ectatic abdominal aorta with fusiform aneurysm of the abdominal aorta just above the bifurcation measuring 5 cm x 5 cm. 2. Iliac arteries are non aneurysmal. 3. Minimal atherosclerotic  calcification. Soft intimal plaque and thrombus throughout the abdominal aorta. 4. 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 5. LEFT nephrolithiasis 6. Extensive LEFT colon diverticulosis without diverticulitis. Electronically Signed   By: Suzy Bouchard M.D.   On: 03/25/2016 15:50   Ct Renal Stone Study  Result Date: 03/24/2016 CLINICAL DATA:  Left lower quadrant pain. History of kidney stones. Painful urination. EXAM: CT ABDOMEN AND PELVIS WITHOUT CONTRAST TECHNIQUE: Multidetector CT imaging of the abdomen and pelvis was performed following the standard protocol without IV contrast. COMPARISON:  None. FINDINGS: Lower chest: No acute abnormality. Hepatobiliary: No focal liver abnormality is seen. Multiple large stones fill the gallbladder without evidence of gallbladder wall thickening or pericholecystic inflammatory change. No biliary dilatation is seen. Pancreas: Unremarkable. Spleen: Unremarkable. Adrenals/Urinary Tract: Unremarkable adrenal glands. Stones in the interpolar region and lower pole of the right kidney measure up to 8 mm in size. There are a greater number of stones in the left kidney, including a 1.5 cm stone in the renal pelvis. There is mild fat stranding about the left renal pelvis and proximal ureter without significant hydronephrosis. No ureteral calculi or ureteral dilatation is seen. The bladder is largely decompressed. Low-density lesions in the right interpolar kidney and left upper pole measure 2.0 cm and 1.3 cm, respectively, most likely cysts. Stomach/Bowel: The stomach is within normal limits. There is no evidence of bowel obstruction. Left-sided colonic diverticulosis is noted without evidence of diverticulitis. The appendix is unremarkable. Vascular/Lymphatic: Infrarenal abdominal aortic aneurysm measures 5.3 x 5.2 cm. There is mild aortoiliac atherosclerosis. No enlarged lymph nodes are identified. Reproductive: Slight prostatic  enlargement. Other: No intraperitoneal free fluid. No abdominal wall mass or hernia. Musculoskeletal: Bilateral L5 pars defects with grade 1 anterolisthesis measuring 6 mm. Trace retrolisthesis of L4 on L5. Severe disc space narrowing at L5-S1 with vacuum disc. IMPRESSION: 1. Bilateral nephrolithiasis including a 1.5 cm stone in the left renal pelvis. No significant hydronephrosis. 2. 5.3 cm infrarenal abdominal aortic aneurysm. Recommend followup by abdomen and pelvis CTA in 3-6 months, and vascular surgery referral/consultation if not already obtained. This recommendation follows ACR consensus guidelines: White Paper of the ACR Incidental Findings Committee II on Vascular Findings. J Am Coll Radiol 2013; 10:789-794. 3. Cholelithiasis. Electronically Signed   By: Logan Bores M.D.   On: 03/24/2016 21:57   Ct Angio Abdomen Pelvis  W &/or Wo Contrast  Result Date: 03/25/2016 CLINICAL DATA:  Abdominal aortic aneurysm. LEFT lower quadrant pain. EXAM: CT ANGIOGRAPHY CHEST, ABDOMEN AND PELVIS TECHNIQUE: Multidetector CT imaging through the chest, abdomen and pelvis was performed using the standard protocol during bolus administration of intravenous contrast. Multiplanar reconstructed images and MIPs were obtained and reviewed to evaluate the vascular anatomy. CONTRAST:  100 mL Isovue 370 COMPARISON:  CT 03/24/2016 FINDINGS: CTA CHEST FINDINGS Cardiovascular: Noncontrast imaging was demonstrate no intramural hematoma. Contrast-enhanced atrial phase imaging demonstrates mild dilatation ascending aorta to  42 mm. Great vessels are normal. Minimal atherosclerotic calcification aortic arch no atherosclerotic calcification aortic arch. No pulmonary embolism. No pericardial fluid. Mediastinum/Nodes: No axillary supraclavicular adenopathy. No mediastinal hilar adenopathy Lungs/Pleura: No suspicious pulmonary nodularity. No pulmonary edema or pleural fluid. Musculoskeletal: No aggressive osseous lesion Review of the MIP  images confirms the above findings. CTA ABDOMEN AND PELVIS FINDINGS VASCULAR Aorta: The abdominal aorta is ectatic. There is fusiform dilatation of the infrarenal abdominal aorta just above the bifurcation measuring 51 x 51 mm in axial dimension and 51 mm in sagittal plane. No evidence of aortic dissection. The majority of the aneurysm sac is thrombosed. More superiorly, there is soft intimal plaque formation along the aorta. The celiac trunk and SMA are widely patent. Inferior mesenteric artery is patent Celiac: Normal SMA: Normal Renals: There are 2 renal arteries on the RIGHT there is ostial narrowing of the more superior dominant renal artery (image 83, series 9). Two renal arteries on the LEFT. There is likewise mild fossa narrowing of ostia the LEFT renal artery. This ostial renal artery narrowing is favored related to plaque in the aorta. Bilateral renal cortical thinning. Kidneys enhance symmetrically IMA: Patent Inflow: The iliac arteries are non aneurysmal. The common iliac arteries measure 1 cm in diameter each. Veins: Normal Review of the MIP images confirms the above findings. NON-VASCULAR Hepatobiliary: No focal hepatic lesion.  Multiple gallstones Pancreas: No inflammation or duct dilatation Spleen: Normal spleen Adrenals/Urinary Tract: Adrenal glands are normal. There is bilateral renal cortical thinning. There is a coarse calcification in the LEFT renal pelvis measuring 1.4 cm. Nonenhancing cyst of the RIGHT kidney Stomach/Bowel: Stomach, small bowel, appendix, and cecum are normal. Several diverticula of the descending colon and multiple diverticula sigmoid rectum normal Lymphatic: Retroperitoneal pelvic lymphadenopathy Reproductive: Prostate normal Other: No inguinal hernia.  No ventral hernia. Musculoskeletal: No aggressive osseous lesion. Review of the MIP images confirms the above findings. IMPRESSION: Chest Impression: 1. No evidence of acute aneurysm or dissection of thoracic aorta. 2. Mild  dilatation of the ascending aorta. Abdomen / Pelvis Impression: 1. Ectatic abdominal aorta with fusiform aneurysm of the abdominal aorta just above the bifurcation measuring 5 cm x 5 cm. 2. Iliac arteries are non aneurysmal. 3. Minimal atherosclerotic calcification. Soft intimal plaque and thrombus throughout the abdominal aorta. 4. 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 5. LEFT nephrolithiasis 6. Extensive LEFT colon diverticulosis without diverticulitis. Electronically Signed   By: Suzy Bouchard M.D.   On: 03/25/2016 15:50    Procedures Procedures (including critical care time)  Medications Ordered in ED Medications  morphine 4 MG/ML injection 4 mg (not administered)     Initial Impression / Assessment and Plan / ED Course  I have reviewed the triage vital signs and the nursing notes.  Pertinent labs & imaging results that were available during my care of the patient were reviewed by me and considered in my medical decision making (see chart for details).  Clinical Course    11:35 PM pain much improved and patient resting comfortably after treatment with intravenous morphine. Dr Trula Slade consulted via telephone and will make arranged for admission. He plans on surgery for tomorrow for repair of aortic aneurysm, though pain felt secondary to 2 hydronephrosis and ureteral colic Chest x-ray and further diagnostic testing to be checked by Dr.Brabham Final Clinical Impressions(s) / ED Diagnoses   #1 ureteral colic #2 abdominal aortic aneurysm #3 hyperglycemia Final diagnoses:  None  New Prescriptions New Prescriptions   No medications on file     Orlie Dakin, MD 03/26/16 2344

## 2016-03-27 ENCOUNTER — Ambulatory Visit: Payer: BLUE CROSS/BLUE SHIELD | Admitting: Cardiology

## 2016-03-27 ENCOUNTER — Encounter (HOSPITAL_COMMUNITY)
Admission: EM | Disposition: A | Discharge status: Home / Self Care | Payer: Self-pay | Source: Home / Self Care | Attending: Surgery

## 2016-03-27 ENCOUNTER — Inpatient Hospital Stay (HOSPITAL_COMMUNITY): Payer: BLUE CROSS/BLUE SHIELD

## 2016-03-27 ENCOUNTER — Inpatient Hospital Stay (HOSPITAL_COMMUNITY): Payer: BLUE CROSS/BLUE SHIELD | Admitting: Certified Registered Nurse Anesthetist

## 2016-03-27 ENCOUNTER — Encounter (HOSPITAL_COMMUNITY): Payer: BLUE CROSS/BLUE SHIELD

## 2016-03-27 ENCOUNTER — Other Ambulatory Visit: Payer: Self-pay | Admitting: *Deleted

## 2016-03-27 ENCOUNTER — Encounter (HOSPITAL_COMMUNITY): Payer: Self-pay

## 2016-03-27 DIAGNOSIS — Z01810 Encounter for preprocedural cardiovascular examination: Secondary | ICD-10-CM

## 2016-03-27 DIAGNOSIS — T508X5A Adverse effect of diagnostic agents, initial encounter: Secondary | ICD-10-CM | POA: Diagnosis not present

## 2016-03-27 DIAGNOSIS — I714 Abdominal aortic aneurysm, without rupture, unspecified: Secondary | ICD-10-CM | POA: Diagnosis present

## 2016-03-27 DIAGNOSIS — F17200 Nicotine dependence, unspecified, uncomplicated: Secondary | ICD-10-CM | POA: Diagnosis present

## 2016-03-27 DIAGNOSIS — N390 Urinary tract infection, site not specified: Secondary | ICD-10-CM | POA: Diagnosis present

## 2016-03-27 DIAGNOSIS — K5792 Diverticulitis of intestine, part unspecified, without perforation or abscess without bleeding: Secondary | ICD-10-CM | POA: Diagnosis present

## 2016-03-27 DIAGNOSIS — N179 Acute kidney failure, unspecified: Secondary | ICD-10-CM | POA: Diagnosis not present

## 2016-03-27 DIAGNOSIS — N200 Calculus of kidney: Secondary | ICD-10-CM | POA: Diagnosis present

## 2016-03-27 DIAGNOSIS — R109 Unspecified abdominal pain: Secondary | ICD-10-CM | POA: Diagnosis present

## 2016-03-27 HISTORY — PX: ABDOMINAL AORTIC ENDOVASCULAR STENT GRAFT: SHX5707

## 2016-03-27 LAB — APTT
APTT: 33 s (ref 24–36)
aPTT: 33 seconds (ref 24–36)

## 2016-03-27 LAB — COMPREHENSIVE METABOLIC PANEL
ALBUMIN: 3.5 g/dL (ref 3.5–5.0)
ALK PHOS: 62 U/L (ref 38–126)
ALT: 12 U/L — AB (ref 17–63)
ALT: 12 U/L — AB (ref 17–63)
AST: 16 U/L (ref 15–41)
AST: 15 U/L (ref 15–41)
Albumin: 3.7 g/dL (ref 3.5–5.0)
Alkaline Phosphatase: 59 U/L (ref 38–126)
Anion gap: 7 (ref 5–15)
Anion gap: 7 (ref 5–15)
BUN: 17 mg/dL (ref 6–20)
BUN: 18 mg/dL (ref 6–20)
CALCIUM: 8.8 mg/dL — AB (ref 8.9–10.3)
CHLORIDE: 108 mmol/L (ref 101–111)
CHLORIDE: 109 mmol/L (ref 101–111)
CO2: 26 mmol/L (ref 22–32)
CO2: 24 mmol/L (ref 22–32)
CREATININE: 1.11 mg/dL (ref 0.61–1.24)
CREATININE: 1.08 mg/dL (ref 0.61–1.24)
Calcium: 8.6 mg/dL — ABNORMAL LOW (ref 8.9–10.3)
GFR calc Af Amer: >60 mL/min (ref >60)
GFR calc Af Amer: >60 mL/min (ref >60)
GLUCOSE: 93 mg/dL (ref 65–99)
Glucose, Bld: 153 mg/dL — ABNORMAL HIGH (ref 65–99)
POTASSIUM: 4.2 mmol/L (ref 3.5–5.1)
Potassium: 3.3 mmol/L — ABNORMAL LOW (ref 3.5–5.1)
SODIUM: 141 mmol/L (ref 135–145)
SODIUM: 140 mmol/L (ref 135–145)
Total Bilirubin: 0.5 mg/dL (ref 0.3–1.2)
Total Bilirubin: 0.4 mg/dL (ref 0.3–1.2)
Total Protein: 6.8 g/dL (ref 6.5–8.1)
Total Protein: 6.5 g/dL (ref 6.5–8.1)

## 2016-03-27 LAB — BASIC METABOLIC PANEL
ANION GAP: 7 (ref 5–15)
BUN: 17 mg/dL (ref 6–20)
CO2: 22 mmol/L (ref 22–32)
Calcium: 8.3 mg/dL — ABNORMAL LOW (ref 8.9–10.3)
Chloride: 108 mmol/L (ref 101–111)
Creatinine, Ser: 1.36 mg/dL — ABNORMAL HIGH (ref 0.61–1.24)
GFR calc Af Amer: >60 mL/min (ref >60)
GFR, EST NON AFRICAN AMERICAN: 54 mL/min — AB (ref >60)
GLUCOSE: 124 mg/dL — AB (ref 65–99)
POTASSIUM: 4 mmol/L (ref 3.5–5.1)
Sodium: 137 mmol/L (ref 135–145)

## 2016-03-27 LAB — URINALYSIS, ROUTINE W REFLEX MICROSCOPIC
Bilirubin Urine: NEGATIVE
Glucose, UA: NEGATIVE mg/dL
Ketones, ur: NEGATIVE mg/dL
Nitrite: NEGATIVE
PH: 6 (ref 5.0–8.0)
Protein, ur: 30 mg/dL — AB
Specific Gravity, Urine: 1.022 (ref 1.005–1.030)

## 2016-03-27 LAB — CBC
HCT: 46.3 % (ref 39.0–52.0)
HCT: 44.9 % (ref 39.0–52.0)
HEMATOCRIT: 42.2 % (ref 39.0–52.0)
HEMOGLOBIN: 16.2 g/dL (ref 13.0–17.0)
Hemoglobin: 15.3 g/dL (ref 13.0–17.0)
Hemoglobin: 14.5 g/dL (ref 13.0–17.0)
MCH: 31 pg (ref 26.0–34.0)
MCH: 30.3 pg (ref 26.0–34.0)
MCH: 30.6 pg (ref 26.0–34.0)
MCHC: 35 g/dL (ref 30.0–36.0)
MCHC: 34.1 g/dL (ref 30.0–36.0)
MCHC: 34.4 g/dL (ref 30.0–36.0)
MCV: 88.5 fL (ref 78.0–100.0)
MCV: 88.9 fL (ref 78.0–100.0)
MCV: 89 fL (ref 78.0–100.0)
PLATELETS: 267 10*3/uL (ref 150–400)
PLATELETS: 255 10*3/uL (ref 150–400)
PLATELETS: 240 10*3/uL (ref 150–400)
RBC: 5.23 MIL/uL (ref 4.22–5.81)
RBC: 5.05 MIL/uL (ref 4.22–5.81)
RBC: 4.74 MIL/uL (ref 4.22–5.81)
RDW: 14 % (ref 11.5–15.5)
RDW: 14.2 % (ref 11.5–15.5)
RDW: 14.3 % (ref 11.5–15.5)
WBC: 10.6 10*3/uL — AB (ref 4.0–10.5)
WBC: 9.2 10*3/uL (ref 4.0–10.5)
WBC: 13.9 10*3/uL — AB (ref 4.0–10.5)

## 2016-03-27 LAB — PROTIME-INR
INR: 1.03
INR: 1.02
INR: 1.11
PROTHROMBIN TIME: 13.4 s (ref 11.4–15.2)
Prothrombin Time: 13.6 seconds (ref 11.4–15.2)
Prothrombin Time: 14.3 seconds (ref 11.4–15.2)

## 2016-03-27 LAB — BLOOD GAS, ARTERIAL
ACID-BASE EXCESS: 2 mmol/L (ref 0.0–2.0)
Bicarbonate: 25.7 mmol/L (ref 20.0–28.0)
DRAWN BY: 406621
O2 CONTENT: 21 L/min
O2 SAT: 94.3 %
PCO2 ART: 37.9 mmHg (ref 32.0–48.0)
Patient temperature: 98.6
pH, Arterial: 7.447 (ref 7.350–7.450)
pO2, Arterial: 69.1 mmHg — ABNORMAL LOW (ref 83.0–108.0)

## 2016-03-27 LAB — URINE MICROSCOPIC-ADD ON

## 2016-03-27 LAB — TYPE AND SCREEN
ABO/RH(D): A POS
Antibody Screen: NEGATIVE

## 2016-03-27 LAB — ABO/RH: ABO/RH(D): A POS

## 2016-03-27 LAB — SURGICAL PCR SCREEN
MRSA, PCR: NEGATIVE
STAPHYLOCOCCUS AUREUS: NEGATIVE

## 2016-03-27 LAB — MAGNESIUM: MAGNESIUM: 1.8 mg/dL (ref 1.7–2.4)

## 2016-03-27 SURGERY — INSERTION, ENDOVASCULAR STENT GRAFT, AORTA, ABDOMINAL
Anesthesia: General | Site: Groin | Laterality: Bilateral

## 2016-03-27 MED ORDER — ENOXAPARIN SODIUM 40 MG/0.4ML ~~LOC~~ SOLN: 40.0000 mg | SUBCUTANEOUS | Status: DC

## 2016-03-27 MED ORDER — PANTOPRAZOLE SODIUM 40 MG PO TBEC
40.0000 mg | DELAYED_RELEASE_TABLET | Freq: Every day | ORAL | Status: DC
  Administered 2016-03-27 – 2016-03-28 (×2): 40 mg via ORAL
  Filled 2016-03-27 (×2): qty 1

## 2016-03-27 MED ORDER — CHLORHEXIDINE GLUCONATE CLOTH 2 % EX PADS: 6.0000 | MEDICATED_PAD | Freq: Once | CUTANEOUS | Status: DC

## 2016-03-27 MED ORDER — GUAIFENESIN-DM 100-10 MG/5ML PO SYRP: 15.0000 mL | ORAL_SOLUTION | ORAL | Status: DC | PRN

## 2016-03-27 MED ORDER — FENTANYL CITRATE (PF) 100 MCG/2ML IJ SOLN
INTRAMUSCULAR | Status: AC
  Filled 2016-03-27: qty 2

## 2016-03-27 MED ORDER — MAGNESIUM SULFATE 2 GM/50ML IV SOLN: 2.0000 g | Freq: Every day | INTRAVENOUS | Status: DC | PRN

## 2016-03-27 MED ORDER — MIDAZOLAM HCL 5 MG/5ML IJ SOLN
INTRAMUSCULAR | Status: DC | PRN
  Administered 2016-03-27: 2 mg via INTRAVENOUS

## 2016-03-27 MED ORDER — PROPOFOL 10 MG/ML IV BOLUS
INTRAVENOUS | Status: DC | PRN
  Administered 2016-03-27: 120 mg via INTRAVENOUS

## 2016-03-27 MED ORDER — ACETAMINOPHEN 500 MG PO TABS: 500.0000 mg | ORAL_TABLET | Freq: Four times a day (QID) | ORAL | Status: DC | PRN

## 2016-03-27 MED ORDER — ONDANSETRON HCL 4 MG/2ML IJ SOLN
INTRAMUSCULAR | Status: DC | PRN
  Administered 2016-03-27: 4 mg via INTRAVENOUS

## 2016-03-27 MED ORDER — LACTATED RINGERS IV SOLN
INTRAVENOUS | Status: DC | PRN
  Administered 2016-03-27: 10:00:00 via INTRAVENOUS

## 2016-03-27 MED ORDER — HEPARIN SODIUM (PORCINE) 1000 UNIT/ML IJ SOLN
INTRAMUSCULAR | Status: DC | PRN
  Administered 2016-03-27: 10000 [IU] via INTRAVENOUS

## 2016-03-27 MED ORDER — METOPROLOL TARTRATE 5 MG/5ML IV SOLN: 2.0000 mg | INTRAVENOUS | Status: DC | PRN

## 2016-03-27 MED ORDER — SODIUM CHLORIDE 0.9 % IV SOLN
500.0000 mL | Freq: Once | INTRAVENOUS | Status: DC | PRN
Start: 2016-03-27 — End: 2016-03-28

## 2016-03-27 MED ORDER — FENTANYL CITRATE (PF) 100 MCG/2ML IJ SOLN
INTRAMUSCULAR | Status: DC | PRN
  Administered 2016-03-27 (×5): 50 ug via INTRAVENOUS

## 2016-03-27 MED ORDER — LABETALOL HCL 5 MG/ML IV SOLN
INTRAVENOUS | Status: DC | PRN
  Administered 2016-03-27 (×2): 10 mg via INTRAVENOUS

## 2016-03-27 MED ORDER — VANCOMYCIN HCL IN DEXTROSE 1-5 GM/200ML-% IV SOLN
1000.0000 mg | INTRAVENOUS | Status: DC
  Filled 2016-03-27: qty 200

## 2016-03-27 MED ORDER — HYDRALAZINE HCL 20 MG/ML IJ SOLN: 5.0000 mg | Freq: Once | INTRAMUSCULAR | Status: DC

## 2016-03-27 MED ORDER — ONDANSETRON HCL 4 MG/2ML IJ SOLN
4.0000 mg | Freq: Four times a day (QID) | INTRAMUSCULAR | Status: DC | PRN
  Administered 2016-03-28: 4 mg via INTRAVENOUS
  Filled 2016-03-27: qty 2

## 2016-03-27 MED ORDER — SODIUM CHLORIDE 0.9 % IV SOLN: INTRAVENOUS | Status: DC

## 2016-03-27 MED ORDER — HYDROCODONE-ACETAMINOPHEN 7.5-325 MG PO TABS
1.0000 | ORAL_TABLET | Freq: Once | ORAL | Status: AC | PRN
  Administered 2016-03-27: 1 via ORAL

## 2016-03-27 MED ORDER — ONDANSETRON HCL 4 MG/2ML IJ SOLN: 4.0000 mg | Freq: Once | INTRAMUSCULAR | Status: DC | PRN

## 2016-03-27 MED ORDER — LABETALOL HCL 5 MG/ML IV SOLN
10.0000 mg | INTRAVENOUS | Status: DC | PRN
  Administered 2016-03-27: 10 mg via INTRAVENOUS
  Filled 2016-03-27: qty 4

## 2016-03-27 MED ORDER — HYDRALAZINE HCL 20 MG/ML IJ SOLN
5.0000 mg | INTRAMUSCULAR | Status: DC | PRN
  Administered 2016-03-27: 5 mg via INTRAVENOUS
  Filled 2016-03-27: qty 1

## 2016-03-27 MED ORDER — POTASSIUM CHLORIDE CRYS ER 20 MEQ PO TBCR
20.0000 meq | EXTENDED_RELEASE_TABLET | Freq: Once | ORAL | Status: AC
  Administered 2016-03-27: 40 meq via ORAL
  Filled 2016-03-27: qty 2

## 2016-03-27 MED ORDER — 0.9 % SODIUM CHLORIDE (POUR BTL) OPTIME
TOPICAL | Status: DC | PRN
  Administered 2016-03-27: 1000 mL

## 2016-03-27 MED ORDER — DOCUSATE SODIUM 100 MG PO CAPS
100.0000 mg | ORAL_CAPSULE | Freq: Every day | ORAL | Status: DC
  Administered 2016-03-28: 100 mg via ORAL
  Filled 2016-03-27: qty 1

## 2016-03-27 MED ORDER — PHENYLEPHRINE HCL 10 MG/ML IJ SOLN
INTRAMUSCULAR | Status: DC | PRN
  Administered 2016-03-27: 80 ug via INTRAVENOUS

## 2016-03-27 MED ORDER — ALUM & MAG HYDROXIDE-SIMETH 200-200-20 MG/5ML PO SUSP
15.0000 mL | ORAL | Status: DC | PRN
Start: 2016-03-27 — End: 2016-03-28

## 2016-03-27 MED ORDER — VANCOMYCIN HCL IN DEXTROSE 1-5 GM/200ML-% IV SOLN
1000.0000 mg | Freq: Two times a day (BID) | INTRAVENOUS | Status: AC
  Administered 2016-03-27 – 2016-03-28 (×2): 1000 mg via INTRAVENOUS
  Filled 2016-03-27 (×2): qty 200

## 2016-03-27 MED ORDER — CIPROFLOXACIN HCL 250 MG PO TABS
250.0000 mg | ORAL_TABLET | Freq: Two times a day (BID) | ORAL | Status: DC
  Administered 2016-03-27 – 2016-03-28 (×2): 250 mg via ORAL
  Filled 2016-03-27 (×2): qty 1

## 2016-03-27 MED ORDER — FENTANYL CITRATE (PF) 250 MCG/5ML IJ SOLN
INTRAMUSCULAR | Status: AC
  Filled 2016-03-27: qty 10

## 2016-03-27 MED ORDER — ACETAMINOPHEN 325 MG PO TABS: 325.0000 mg | ORAL_TABLET | ORAL | Status: DC | PRN

## 2016-03-27 MED ORDER — MORPHINE SULFATE (PF) 2 MG/ML IV SOLN
2.0000 mg | INTRAVENOUS | Status: DC | PRN
  Administered 2016-03-27: 4 mg via INTRAVENOUS
  Administered 2016-03-27: 2 mg via INTRAVENOUS
  Filled 2016-03-27: qty 1
  Filled 2016-03-27: qty 2

## 2016-03-27 MED ORDER — IODIXANOL 320 MG/ML IV SOLN
INTRAVENOUS | Status: DC | PRN
  Administered 2016-03-27: 65 mL via INTRAVENOUS

## 2016-03-27 MED ORDER — HYDRALAZINE HCL 20 MG/ML IJ SOLN
INTRAMUSCULAR | Status: AC
Start: 2016-03-27 — End: 2016-03-28
  Filled 2016-03-27: qty 1

## 2016-03-27 MED ORDER — ROCURONIUM BROMIDE 100 MG/10ML IV SOLN
INTRAVENOUS | Status: DC | PRN
  Administered 2016-03-27: 110 mg via INTRAVENOUS

## 2016-03-27 MED ORDER — MIDAZOLAM HCL 2 MG/2ML IJ SOLN
INTRAMUSCULAR | Status: AC
  Filled 2016-03-27: qty 2

## 2016-03-27 MED ORDER — ASPIRIN EC 81 MG PO TBEC
81.0000 mg | DELAYED_RELEASE_TABLET | Freq: Every day | ORAL | Status: DC
  Administered 2016-03-27 – 2016-03-28 (×2): 81 mg via ORAL
  Filled 2016-03-27 (×2): qty 1

## 2016-03-27 MED ORDER — ATORVASTATIN CALCIUM 10 MG PO TABS
10.0000 mg | ORAL_TABLET | Freq: Every day | ORAL | Status: DC
  Administered 2016-03-28: 10 mg via ORAL
  Filled 2016-03-27 (×2): qty 1

## 2016-03-27 MED ORDER — LACTATED RINGERS IV SOLN
Freq: Once | INTRAVENOUS | Status: AC
  Administered 2016-03-27: 10:00:00 via INTRAVENOUS

## 2016-03-27 MED ORDER — SODIUM CHLORIDE 0.9 % IV SOLN
INTRAVENOUS | Status: DC | PRN
  Administered 2016-03-27: 500 mL

## 2016-03-27 MED ORDER — PHENYLEPHRINE HCL 10 MG/ML IJ SOLN
INTRAMUSCULAR | Status: DC | PRN
  Administered 2016-03-27: 20 ug/min via INTRAVENOUS

## 2016-03-27 MED ORDER — HYDROCODONE-ACETAMINOPHEN 7.5-325 MG PO TABS
ORAL_TABLET | ORAL | Status: AC
  Filled 2016-03-27: qty 1

## 2016-03-27 MED ORDER — ACETAMINOPHEN 325 MG RE SUPP
325.0000 mg | RECTAL | Status: DC | PRN
  Filled 2016-03-27: qty 2

## 2016-03-27 MED ORDER — OXYCODONE HCL 5 MG PO TABS
5.0000 mg | ORAL_TABLET | ORAL | Status: DC | PRN
  Administered 2016-03-28 (×2): 10 mg via ORAL
  Filled 2016-03-27 (×2): qty 2

## 2016-03-27 MED ORDER — POTASSIUM CHLORIDE CRYS ER 20 MEQ PO TBCR: 20.0000 meq | EXTENDED_RELEASE_TABLET | Freq: Every day | ORAL | Status: DC | PRN

## 2016-03-27 MED ORDER — PROTAMINE SULFATE 10 MG/ML IV SOLN
INTRAVENOUS | Status: DC | PRN
  Administered 2016-03-27: 10 mg via INTRAVENOUS
  Administered 2016-03-27: 40 mg via INTRAVENOUS

## 2016-03-27 MED ORDER — VANCOMYCIN HCL IN DEXTROSE 1-5 GM/200ML-% IV SOLN
1000.0000 mg | INTRAVENOUS | Status: AC
  Administered 2016-03-27: 1000 mg via INTRAVENOUS
  Filled 2016-03-27 (×2): qty 200

## 2016-03-27 MED ORDER — ESMOLOL HCL 100 MG/10ML IV SOLN
INTRAVENOUS | Status: DC | PRN
  Administered 2016-03-27 (×2): 30 mg via INTRAVENOUS

## 2016-03-27 MED ORDER — PHENOL 1.4 % MT LIQD: 1.0000 | OROMUCOSAL | Status: DC | PRN

## 2016-03-27 MED ORDER — HYDROCODONE-ACETAMINOPHEN 5-325 MG PO TABS
ORAL_TABLET | ORAL | Status: DC
Start: 2016-03-27 — End: 2016-03-27
  Filled 2016-03-27: qty 1

## 2016-03-27 MED ORDER — LIDOCAINE HCL (CARDIAC) 20 MG/ML IV SOLN
INTRAVENOUS | Status: DC | PRN
  Administered 2016-03-27: 100 mg via INTRAVENOUS

## 2016-03-27 MED ORDER — FENTANYL CITRATE (PF) 100 MCG/2ML IJ SOLN
25.0000 ug | INTRAMUSCULAR | Status: DC | PRN
  Administered 2016-03-27 (×4): 25 ug via INTRAVENOUS

## 2016-03-27 SURGICAL SUPPLY — 59 items
BAG SNAP BAND KOVER 36X36 (MISCELLANEOUS) ×2 IMPLANT
BLADE SURG CLIPPER 3M 9600 (MISCELLANEOUS) ×2 IMPLANT
CANISTER SUCTION 2500CC (MISCELLANEOUS) ×2 IMPLANT
CATH ANGIO 5F BER2 65CM (CATHETERS) IMPLANT
CATH BEACON 5.038 65CM KMP-01 (CATHETERS) IMPLANT
CATH OMNI FLUSH .035X70CM (CATHETERS) IMPLANT
COVER PROBE W GEL 5X96 (DRAPES) ×2 IMPLANT
DERMABOND ADVANCED (GAUZE/BANDAGES/DRESSINGS) ×1
DERMABOND ADVANCED .7 DNX12 (GAUZE/BANDAGES/DRESSINGS) ×1 IMPLANT
DEVICE CLOSURE PERCLS PRGLD 6F (VASCULAR PRODUCTS) IMPLANT
DRAPE ZERO GRAVITY STERILE (DRAPES) ×2 IMPLANT
DRSG TEGADERM 2-3/8X2-3/4 SM (GAUZE/BANDAGES/DRESSINGS) ×4 IMPLANT
DRSG TEGADERM 4X4.75 (GAUZE/BANDAGES/DRESSINGS) ×2 IMPLANT
ELECT REM PT RETURN 9FT ADLT (ELECTROSURGICAL) ×4
ELECTRODE REM PT RTRN 9FT ADLT (ELECTROSURGICAL) ×2 IMPLANT
EXCLUDER TNK LEG 31MX14X13 (Endovascular Graft) ×1 IMPLANT
EXCLUDER TRUNK LEG 31MX14X13 (Endovascular Graft) ×2 IMPLANT
GAUZE SPONGE 2X2 8PLY STRL LF (GAUZE/BANDAGES/DRESSINGS) ×1 IMPLANT
GLOVE BIOGEL PI IND STRL 7.5 (GLOVE) ×1 IMPLANT
GLOVE BIOGEL PI INDICATOR 7.5 (GLOVE) ×1
GLOVE SURG SS PI 7.5 STRL IVOR (GLOVE) ×2 IMPLANT
GOWN STRL REUS W/ TWL LRG LVL3 (GOWN DISPOSABLE) ×2 IMPLANT
GOWN STRL REUS W/ TWL XL LVL3 (GOWN DISPOSABLE) ×1 IMPLANT
GOWN STRL REUS W/TWL LRG LVL3 (GOWN DISPOSABLE) ×2
GOWN STRL REUS W/TWL XL LVL3 (GOWN DISPOSABLE) ×1
GRAFT BALLN CATH 65CM (STENTS) IMPLANT
HEMOSTAT SNOW SURGICEL 2X4 (HEMOSTASIS) IMPLANT
KIT BASIN OR (CUSTOM PROCEDURE TRAY) ×2 IMPLANT
KIT ROOM TURNOVER OR (KITS) ×2 IMPLANT
LEG CONTRALATERAL 16X14.5X10 (Vascular Products) ×2 IMPLANT
LEG CONTRALATERAL 16X16X13.5 (Endovascular Graft) ×1 IMPLANT
LEG CONTRALATERAL 36X4.5 (Permanent Stent) ×2 IMPLANT
NEEDLE PERC 18GX7CM (NEEDLE) ×2 IMPLANT
NS IRRIG 1000ML POUR BTL (IV SOLUTION) ×2 IMPLANT
PACK ENDOVASCULAR (PACKS) ×2 IMPLANT
PAD ARMBOARD 7.5X6 YLW CONV (MISCELLANEOUS) ×4 IMPLANT
PENCIL BUTTON HOLSTER BLD 10FT (ELECTRODE) ×2 IMPLANT
PERCLOSE PROGLIDE 6F (VASCULAR PRODUCTS)
SHEATH AVANTI 11CM 8FR (MISCELLANEOUS) IMPLANT
SHEATH BRITE TIP 8FR 23CM (MISCELLANEOUS) IMPLANT
SHEATH INTRODUCER 18X30 (VASCULAR PRODUCTS) ×1
SHEATH PERFORMER 18FRX30 (VASCULAR PRODUCTS) ×1 IMPLANT
SHIELD RADPAD SCOOP 12X17 (MISCELLANEOUS) ×4 IMPLANT
SPONGE GAUZE 2X2 STER 10/PKG (GAUZE/BANDAGES/DRESSINGS) ×1
STENT GRAFT BALLN CATH 65CM (STENTS)
STENT GRAFT CONTRALAT 16X13.5 (Endovascular Graft) ×1 IMPLANT
STOPCOCK MORSE 400PSI 3WAY (MISCELLANEOUS) ×2 IMPLANT
SUT ETHILON 3 0 PS 1 (SUTURE) IMPLANT
SUT PROLENE 5 0 C 1 24 (SUTURE) IMPLANT
SUT VIC AB 2-0 CT1 27 (SUTURE)
SUT VIC AB 2-0 CT1 TAPERPNT 27 (SUTURE) IMPLANT
SUT VIC AB 3-0 SH 27 (SUTURE)
SUT VIC AB 3-0 SH 27X BRD (SUTURE) IMPLANT
SUT VICRYL 4-0 PS2 18IN ABS (SUTURE) ×4 IMPLANT
SYR 30ML LL (SYRINGE) ×2 IMPLANT
TRAY FOLEY W/METER SILVER 16FR (SET/KITS/TRAYS/PACK) ×2 IMPLANT
TUBING HIGH PRESSURE 120CM (CONNECTOR) ×2 IMPLANT
WIRE AMPLATZ SS-J .035X180CM (WIRE) IMPLANT
WIRE BENTSON .035X145CM (WIRE) ×2 IMPLANT

## 2016-03-27 NOTE — Anesthesia Preprocedure Evaluation (Signed)
Anesthesia Evaluation  Patient identified by MRN, date of birth, ID band Patient awake    Reviewed: Allergy & Precautions, H&P , NPO status , Patient's Chart, lab work & pertinent test results  History of Anesthesia Complications Negative for: history of anesthetic complications  Airway Mallampati: II  TM Distance: >3 FB Neck ROM: full    Dental no notable dental hx.    Pulmonary Current Smoker,    Pulmonary exam normal breath sounds clear to auscultation       Cardiovascular + Peripheral Vascular Disease  Normal cardiovascular exam Rhythm:regular Rate:Normal     Neuro/Psych negative neurological ROS     GI/Hepatic negative GI ROS, Neg liver ROS,   Endo/Other  negative endocrine ROS  Renal/GU Renal disease     Musculoskeletal   Abdominal   Peds  Hematology negative hematology ROS (+)   Anesthesia Other Findings   Reproductive/Obstetrics negative OB ROS                             Anesthesia Physical Anesthesia Plan  ASA: III  Anesthesia Plan: General   Post-op Pain Management:    Induction: Intravenous  Airway Management Planned: Oral ETT  Additional Equipment: Arterial line  Intra-op Plan:   Post-operative Plan: Extubation in OR  Informed Consent: I have reviewed the patients History and Physical, chart, labs and discussed the procedure including the risks, benefits and alternatives for the proposed anesthesia with the patient or authorized representative who has indicated his/her understanding and acceptance.   Dental Advisory Given  Plan Discussed with: Anesthesiologist, CRNA and Surgeon  Anesthesia Plan Comments:         Anesthesia Quick Evaluation

## 2016-03-27 NOTE — Progress Notes (Signed)
New Admission Note:   Arrival Method: From ED via stretcher Mental Orientation: AOX4 Telemetry: 2W08 Assessment: Completed Skin: Intact IV: R Forearm (Saline Locked) Pain: Pt complains of abdomen pain 6/10 Tubes: None Safety Measures: Safety Fall Prevention Plan has been discussed on Admission:  2 [place] Orientation: Patient has been orientated to the room, unit and staff.  Family: non-present   Orders to be reviewed and implemented. Will continue to monitor the patient. Call light has been placed within reach.    Isac Caddy, RN

## 2016-03-27 NOTE — Anesthesia Procedure Notes (Signed)
Procedure Name: Intubation Date/Time: 03/27/2016 10:57 AM Performed by: Salli Quarry Tabbitha Janvrin Pre-anesthesia Checklist: Patient identified, Emergency Drugs available, Suction available and Patient being monitored Patient Re-evaluated:Patient Re-evaluated prior to inductionOxygen Delivery Method: Circle System Utilized Preoxygenation: Pre-oxygenation with 100% oxygen Intubation Type: IV induction Ventilation: Mask ventilation without difficulty Laryngoscope Size: Mac and 4 Grade View: Grade I Tube type: Oral Tube size: 7.5 mm Number of attempts: 1 Airway Equipment and Method: Stylet Placement Confirmation: ETT inserted through vocal cords under direct vision,  positive ETCO2 and breath sounds checked- equal and bilateral Secured at: 24 cm Tube secured with: Tape Dental Injury: Teeth and Oropharynx as per pre-operative assessment

## 2016-03-27 NOTE — ED Notes (Signed)
Attempted report x1. 

## 2016-03-27 NOTE — Progress Notes (Signed)
  Day of Surgery Note    Subjective:  C/o nausea and the catheter is bothering hiim  Vitals:   03/27/16 1341 03/27/16 1356  BP: (!) 172/97 (!) 163/94  Pulse: 62 63  Resp: 13 14  Temp:      Incisions:   Bilateral groins are soft without hematoma Extremities:  Easily palpable DP/PT bilaterally Cardiac:  regular Lungs:  Non labored Abdomen:  Soft, NT/ND  Assessment/Plan:  This is a 65 y.o. male who is s/p EVAR  -pt doing well in recovery -BP high with cuff pressure 160's-170's and A-line 200's--cuff is essentially the same on both arms. -to 3 south when bed available    Leontine Locket, PA-C 03/27/2016 2:46 PM [phone]

## 2016-03-27 NOTE — ED Provider Notes (Signed)
[facility] DEPT Provider Note   CSN: [id number] Arrival date & time: 03/24/16  1617     History   Chief Complaint Chief Complaint  Patient presents with  . LLQ pain    HPI GIROLAMO VEKSLER is a 65 y.o. male.  He presents for evaluation of left lower quadrant, abdominal pain, which reminded him of a kidney stone which she has previously had. He states that he has a known large stone on that side. He denies fever, chills, nausea, vomiting, cough, shortness of breath or chest pain. He denies dysuria, urinary frequency, or hematuria. Symptoms started several days ago. The pain waxes and wanes. There are no other known modifying factors  HPI  Past Medical History:  Diagnosis Date  . Diverticulosis   . Hemorrhoids   . Kidney stones     Patient Active Problem List   Diagnosis Date Noted  . AAA (abdominal aortic aneurysm) ([place]) 03/27/2016    History reviewed. No pertinent surgical history.     Home Medications    Prior to Admission medications   Medication Sig Start Date End Date Taking? Authorizing Provider  acetaminophen (TYLENOL) 500 MG tablet Take 500-1,000 mg by mouth every 6 (six) hours as needed (for pain).   Yes Historical Provider, MD  aspirin EC 81 MG tablet Take 81 mg by mouth daily.   Yes Historical Provider, MD  atorvastatin (LIPITOR) 10 MG tablet Take 1 tablet (10 mg total) by mouth daily. 03/25/16   Serafina Mitchell, MD  HYDROcodone-acetaminophen (NORCO) 5-325 MG tablet Take 1 tablet by mouth every 4 (four) hours as needed. 03/24/16   Daleen Bo, MD    Family History No family history on file.  Social History Social History  Substance Use Topics  . Smoking status: Current Every Day Smoker    Types: Cigarettes  . Smokeless tobacco: Never Used     Comment: 1 pk per day  . Alcohol use Not on file     Allergies   Penicillins   Review of Systems Review of Systems  All other systems reviewed and are negative.    Physical Exam Updated  Vital Signs BP (!) 189/114 (BP Location: Right Arm)   Pulse 64   Temp 97.9 F (36.6 C) (Oral)   Resp 18   SpO2 97%   Physical Exam  Constitutional: He is oriented to person, place, and time. He appears well-developed and well-nourished.  HENT:  Head: Normocephalic and atraumatic.  Right Ear: External ear normal.  Left Ear: External ear normal.  Eyes: Conjunctivae and EOM are normal. Pupils are equal, round, and reactive to light.  Neck: Normal range of motion and phonation normal. Neck supple.  Cardiovascular: Normal rate, regular rhythm and normal heart sounds.   Pulmonary/Chest: Effort normal and breath sounds normal. He exhibits no bony tenderness.  Abdominal: Soft. He exhibits no distension and no mass. There is tenderness (Left lower quadrant, mild). There is no rebound and no guarding.  Musculoskeletal: Normal range of motion.  Neurological: He is alert and oriented to person, place, and time. No cranial nerve deficit or sensory deficit. He exhibits normal muscle tone. Coordination normal.  Skin: Skin is warm, dry and intact.  Psychiatric: He has a normal mood and affect. His behavior is normal. Judgment and thought content normal.  Nursing note and vitals reviewed.    ED Treatments / Results  Labs (all labs ordered are listed, but only abnormal results are displayed) Labs Reviewed  COMPREHENSIVE METABOLIC PANEL - Abnormal; Notable  for the following:       Result Value   Glucose, Bld 100 (*)    BUN 21 (*)    Creatinine, Ser 1.28 (*)    Calcium 8.5 (*)    AST 13 (*)    ALT 14 (*)    GFR calc non Af Amer 58 (*)    All other components within normal limits  URINALYSIS, ROUTINE W REFLEX MICROSCOPIC (NOT AT [facility]) - Abnormal; Notable for the following:    Hgb urine dipstick LARGE (*)    Protein, ur 30 (*)    Leukocytes, UA SMALL (*)    All other components within normal limits  URINE MICROSCOPIC-ADD ON - Abnormal; Notable for the following:    Squamous Epithelial / LPF  0-5 (*)    Bacteria, UA RARE (*)    All other components within normal limits  LIPASE, BLOOD  CBC    EKG  EKG Interpretation None       Radiology Ct Abdomen Pelvis Wo Contrast  Result Date: 03/26/2016 CLINICAL DATA:  65 year old male with left-sided abdominal and pelvic pain. EXAM: CT ABDOMEN AND PELVIS WITHOUT CONTRAST TECHNIQUE: Multidetector CT imaging of the abdomen and pelvis was performed following the standard protocol without IV contrast. COMPARISON:  CT dated 03/25/2016 and 03/24/2016 FINDINGS: Evaluation of this exam is limited in the absence of intravenous contrast. Lower chest: The visualized lung bases are clear. No intra-abdominal free air or free fluid. Hepatobiliary: The liver is unremarkable. The gallbladder is filled with stones. There is no pericholecystic fluid. No biliary ductal dilatation. No calcified stone noted within the CBD. Ultrasound may provide better evaluation of the gallbladder clinically indicated. Pancreas: Unremarkable. No pancreatic ductal dilatation or surrounding inflammatory changes. Spleen: Normal in size without focal abnormality. Adrenals/Urinary Tract: The adrenal glands appear unremarkable. There is a 12 x 14 mm partially obstructing stone in the left renal pelvis with mild left hydronephrosis. There is mild haziness of the left renal hilar fat. Correlation with urinalysis recommended to evaluate for superimposed UTI. Multiple other nonobstructing left renal stones measuring up to 10 mm in the interpolar aspect of the left kidney noted. There are two nonobstructing stones in the right kidney measuring up to 9 mm. There is no hydronephrosis on the right. Two adjacent hypodense lesions in the inferior pole of the right kidney are not well characterized on this noncontrast study but appears similar to prior CT and most likely represent cysts. The visualized ureters appear unremarkable. The urinary bladder is predominantly collapsed. There is apparent  diffuse thickening of the bladder wall which may be partly related to underdistention. Cystitis is not excluded. Correlation with urinalysis recommended. Stomach/Bowel: There is extensive sigmoid diverticulosis with muscular hypertrophy. Diffuse diverticulosis of the descending colon. No active inflammatory changes. There is no evidence of bowel obstruction for active inflammation. Normal appendix. Vascular/Lymphatic: The aorta is tortuous. Sign type of There is a fusiform infrarenal abdominal aortic aneurysm measuring 5.3 cm in greatest axial diameter. This aneurysm measures similar in size to the study of 03/25/2016 and 03/24/2016 by my measurement. The aneurysm measures approximately 7-8 cm in craniocaudal length and extends to the level of the bifurcation. There is mural thrombus/hematoma within the wall of the aneurysm. There is no periaortic stranding or inflammatory changes. Evaluation of the aorta and vasculature is however limited on this noncontrast study. No portal venous gas identified. There is no adenopathy. Reproductive: The prostate and seminal vesicles are grossly unremarkable. Other: Small fat containing umbilical hernia. Musculoskeletal: There  bilateral L5 pars defects with grade I L5-S1 anterolisthesis. L5-S1 disc desiccation with vacuum phenomena. No acute fracture. IMPRESSION: Obstructing calculus in the left renal pelvis with mild left hydronephrosis. Correlation with urinalysis recommended to exclude superimposed UTI. Multiple no other nonobstructing bilateral renal calculi noted. There is no hydronephrosis on the right. Fusiform infrarenal abdominal aortic aneurysm measuring 5.3 cm in greatest transverse axial dimension similar to the prior CT. There is mural thrombus or hematoma along the wall of the aneurysm. No periaortic stranding or evidence of inflammatory changes identified by CT. Evaluation however is limited in the absence of intravenous contrast. Cholelithiasis. Extensive distal  colonic diverticulosis without active inflammatory changes. No evidence of bowel obstruction. Normal appendix. These results were called by telephone at the time of interpretation on 03/26/2016 at 10:06 pm to Dr. Trula Slade, who verbally acknowledged these results. Electronically Signed   By: Anner Crete M.D.   On: 03/26/2016 22:35   Dg Chest 2 View  Result Date: 03/27/2016 CLINICAL DATA:  Preop, triple a surgery in a.m. Left lower anterior abdominal pain EXAM: CHEST  2 VIEW COMPARISON:  CT 03/25/2016 FINDINGS: Mild linear atelectasis at the left base. No acute infiltrate or effusion. Heart size upper limits of normal. Tortuous aorta. No pneumothorax. IMPRESSION: 1. Minimal left basilar atelectasis. 2. No radiographic evidence for acute cardiopulmonary abnormality. Electronically Signed   By: Donavan Foil M.D.   On: 03/27/2016 00:19   Ct Angio Chest Aorta W &/or Wo Contrast  Result Date: 03/25/2016 CLINICAL DATA:  Abdominal aortic aneurysm. LEFT lower quadrant pain. EXAM: CT ANGIOGRAPHY CHEST, ABDOMEN AND PELVIS TECHNIQUE: Multidetector CT imaging through the chest, abdomen and pelvis was performed using the standard protocol during bolus administration of intravenous contrast. Multiplanar reconstructed images and MIPs were obtained and reviewed to evaluate the vascular anatomy. CONTRAST:  100 mL Isovue 370 COMPARISON:  CT 03/24/2016 FINDINGS: CTA CHEST FINDINGS Cardiovascular: Noncontrast imaging was demonstrate no intramural hematoma. Contrast-enhanced atrial phase imaging demonstrates mild dilatation ascending aorta to 42 mm. Great vessels are normal. Minimal atherosclerotic calcification aortic arch no atherosclerotic calcification aortic arch. No pulmonary embolism. No pericardial fluid. Mediastinum/Nodes: No axillary supraclavicular adenopathy. No mediastinal hilar adenopathy Lungs/Pleura: No suspicious pulmonary nodularity. No pulmonary edema or pleural fluid. Musculoskeletal: No aggressive  osseous lesion Review of the MIP images confirms the above findings. CTA ABDOMEN AND PELVIS FINDINGS VASCULAR Aorta: The abdominal aorta is ectatic. There is fusiform dilatation of the infrarenal abdominal aorta just above the bifurcation measuring 51 x 51 mm in axial dimension and 51 mm in sagittal plane. No evidence of aortic dissection. The majority of the aneurysm sac is thrombosed. More superiorly, there is soft intimal plaque formation along the aorta. The celiac trunk and SMA are widely patent. Inferior mesenteric artery is patent Celiac: Normal SMA: Normal Renals: There are 2 renal arteries on the RIGHT there is ostial narrowing of the more superior dominant renal artery (image 83, series 9). Two renal arteries on the LEFT. There is likewise mild fossa narrowing of ostia the LEFT renal artery. This ostial renal artery narrowing is favored related to plaque in the aorta. Bilateral renal cortical thinning. Kidneys enhance symmetrically IMA: Patent Inflow: The iliac arteries are non aneurysmal. The common iliac arteries measure 1 cm in diameter each. Veins: Normal Review of the MIP images confirms the above findings. NON-VASCULAR Hepatobiliary: No focal hepatic lesion.  Multiple gallstones Pancreas: No inflammation or duct dilatation Spleen: Normal spleen Adrenals/Urinary Tract: Adrenal glands are normal. There is bilateral renal cortical  thinning. There is a coarse calcification in the LEFT renal pelvis measuring 1.4 cm. Nonenhancing cyst of the RIGHT kidney Stomach/Bowel: Stomach, small bowel, appendix, and cecum are normal. Several diverticula of the descending colon and multiple diverticula sigmoid rectum normal Lymphatic: Retroperitoneal pelvic lymphadenopathy Reproductive: Prostate normal Other: No inguinal hernia.  No ventral hernia. Musculoskeletal: No aggressive osseous lesion. Review of the MIP images confirms the above findings. IMPRESSION: Chest Impression: 1. No evidence of acute aneurysm or  dissection of thoracic aorta. 2. Mild dilatation of the ascending aorta. Abdomen / Pelvis Impression: 1. Ectatic abdominal aorta with fusiform aneurysm of the abdominal aorta just above the bifurcation measuring 5 cm x 5 cm. 2. Iliac arteries are non aneurysmal. 3. Minimal atherosclerotic calcification. Soft intimal plaque and thrombus throughout the abdominal aorta. 4. 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 5. LEFT nephrolithiasis 6. Extensive LEFT colon diverticulosis without diverticulitis. Electronically Signed   By: Suzy Bouchard M.D.   On: 03/25/2016 15:50   Ct Angio Abdomen Pelvis  W &/or Wo Contrast  Result Date: 03/25/2016 CLINICAL DATA:  Abdominal aortic aneurysm. LEFT lower quadrant pain. EXAM: CT ANGIOGRAPHY CHEST, ABDOMEN AND PELVIS TECHNIQUE: Multidetector CT imaging through the chest, abdomen and pelvis was performed using the standard protocol during bolus administration of intravenous contrast. Multiplanar reconstructed images and MIPs were obtained and reviewed to evaluate the vascular anatomy. CONTRAST:  100 mL Isovue 370 COMPARISON:  CT 03/24/2016 FINDINGS: CTA CHEST FINDINGS Cardiovascular: Noncontrast imaging was demonstrate no intramural hematoma. Contrast-enhanced atrial phase imaging demonstrates mild dilatation ascending aorta to 42 mm. Great vessels are normal. Minimal atherosclerotic calcification aortic arch no atherosclerotic calcification aortic arch. No pulmonary embolism. No pericardial fluid. Mediastinum/Nodes: No axillary supraclavicular adenopathy. No mediastinal hilar adenopathy Lungs/Pleura: No suspicious pulmonary nodularity. No pulmonary edema or pleural fluid. Musculoskeletal: No aggressive osseous lesion Review of the MIP images confirms the above findings. CTA ABDOMEN AND PELVIS FINDINGS VASCULAR Aorta: The abdominal aorta is ectatic. There is fusiform dilatation of the infrarenal abdominal aorta just above the bifurcation  measuring 51 x 51 mm in axial dimension and 51 mm in sagittal plane. No evidence of aortic dissection. The majority of the aneurysm sac is thrombosed. More superiorly, there is soft intimal plaque formation along the aorta. The celiac trunk and SMA are widely patent. Inferior mesenteric artery is patent Celiac: Normal SMA: Normal Renals: There are 2 renal arteries on the RIGHT there is ostial narrowing of the more superior dominant renal artery (image 83, series 9). Two renal arteries on the LEFT. There is likewise mild fossa narrowing of ostia the LEFT renal artery. This ostial renal artery narrowing is favored related to plaque in the aorta. Bilateral renal cortical thinning. Kidneys enhance symmetrically IMA: Patent Inflow: The iliac arteries are non aneurysmal. The common iliac arteries measure 1 cm in diameter each. Veins: Normal Review of the MIP images confirms the above findings. NON-VASCULAR Hepatobiliary: No focal hepatic lesion.  Multiple gallstones Pancreas: No inflammation or duct dilatation Spleen: Normal spleen Adrenals/Urinary Tract: Adrenal glands are normal. There is bilateral renal cortical thinning. There is a coarse calcification in the LEFT renal pelvis measuring 1.4 cm. Nonenhancing cyst of the RIGHT kidney Stomach/Bowel: Stomach, small bowel, appendix, and cecum are normal. Several diverticula of the descending colon and multiple diverticula sigmoid rectum normal Lymphatic: Retroperitoneal pelvic lymphadenopathy Reproductive: Prostate normal Other: No inguinal hernia.  No ventral hernia. Musculoskeletal: No aggressive osseous lesion. Review of the MIP images  confirms the above findings. IMPRESSION: Chest Impression: 1. No evidence of acute aneurysm or dissection of thoracic aorta. 2. Mild dilatation of the ascending aorta. Abdomen / Pelvis Impression: 1. Ectatic abdominal aorta with fusiform aneurysm of the abdominal aorta just above the bifurcation measuring 5 cm x 5 cm. 2. Iliac arteries  are non aneurysmal. 3. Minimal atherosclerotic calcification. Soft intimal plaque and thrombus throughout the abdominal aorta. 4. 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 5. LEFT nephrolithiasis 6. Extensive LEFT colon diverticulosis without diverticulitis. Electronically Signed   By: Suzy Bouchard M.D.   On: 03/25/2016 15:50    Procedures Procedures (including critical care time)  Medications Ordered in ED Medications - No data to display   Initial Impression / Assessment and Plan / ED Course  I have reviewed the triage vital signs and the nursing notes.  Pertinent labs & imaging results that were available during my care of the patient were reviewed by me and considered in my medical decision making (see chart for details).  Clinical Course    Medications - No data to display  No data found.   At D/C Reevaluation with update and discussion. After initial assessment and treatment, an updated evaluation reveals he remains comfortable. No additional complaints. Findings discussed with patient and all questions answered. Terence Bart L    Final Clinical Impressions(s) / ED Diagnoses   Final diagnoses:  Left lower quadrant pain  Abdominal aortic aneurysm (AAA) without rupture ([facility])  Renal stone   Nonspecific left lower quadrant abdominal pain. He has a stone in his left kidney, without obstruction. No evidence for UTI, serious bacterial infection or metabolic instability. Apparent incidental abdominal aortic aneurysm, not currently bleeding or with indication of rapid enlargement. This appears to be truly incidental and can be evaluated as an outpatient.  Nursing Notes Reviewed/ Care Coordinated Applicable Imaging Reviewed Interpretation of Laboratory Data incorporated into ED treatment  The patient appears reasonably screened and/or stabilized for discharge and I doubt any other medical condition or other [facility] requiring further screening,  evaluation, or treatment in the ED at this time prior to discharge.  Plan: Home Medications- continue; Home Treatments- rest; return here if the recommended treatment, does not improve the symptoms; Recommended follow up- Urology and Vascular Surgery f/u in 1-2 weeks   New Prescriptions Discharge Medication List as of 03/24/2016 10:35 PM    START taking these medications   Details  HYDROcodone-acetaminophen (NORCO) 5-325 MG tablet Take 1 tablet by mouth every 4 (four) hours as needed., Starting Sun 03/24/2016, Print         Daleen Bo, MD 03/27/16 [phone]

## 2016-03-27 NOTE — Transfer of Care (Signed)
Immediate Anesthesia Transfer of Care Note  Patient: Anthony Pierce  Procedure(s) Performed: Procedure(s): ABDOMINAL AORTIC ENDOVASCULAR STENT GRAFT (Bilateral)  Patient Location: PACU  Anesthesia Type:General  Level of Consciousness: awake, alert , oriented and patient cooperative  Airway & Oxygen Therapy: Patient Spontanous Breathing and Patient connected to nasal cannula oxygen  Post-op Assessment: Report given to RN and Post -op Vital signs reviewed and stable  Post vital signs: Reviewed and stable  Last Vitals:  Vitals:   03/27/16 0226 03/27/16 0633  BP: (!) 149/94 (!) 142/89  Pulse: 76 66  Resp:    Temp: 36.7 C 36.5 C    Last Pain:  Vitals:   03/27/16 0633  TempSrc: Oral  PainSc:          Complications: No apparent anesthesia complications

## 2016-03-27 NOTE — Anesthesia Postprocedure Evaluation (Signed)
Anesthesia Post Note  Patient: Anthony Pierce  Procedure(s) Performed: Procedure(s) (LRB): ABDOMINAL AORTIC ENDOVASCULAR STENT GRAFT (Bilateral)  Patient location during evaluation: PACU Anesthesia Type: General Level of consciousness: awake and alert Pain management: pain level controlled Vital Signs Assessment: post-procedure vital signs reviewed and stable Respiratory status: spontaneous breathing, nonlabored ventilation, respiratory function stable and patient connected to nasal cannula oxygen Cardiovascular status: blood pressure returned to baseline and stable Postop Assessment: no signs of nausea or vomiting Anesthetic complications: no    Last Vitals:  Vitals:   03/27/16 1341 03/27/16 1356  BP: (!) 172/97 (!) 163/94  Pulse: 62 63  Resp: 13 14  Temp:      Last Pain:  Vitals:   03/27/16 1530  TempSrc:   PainSc: 7                  Zenaida Deed

## 2016-03-27 NOTE — Op Note (Signed)
Patient name: Anthony Pierce MRN: [id number] DOB: 1951-03-17 Sex: male  03/26/2016 - 03/27/2016 Pre-operative Diagnosis: AAA Post-operative diagnosis:  Same Surgeon:  Annamarie Major Assistants:  Silva Bandy Procedure:   #1: Endovascular repair of abdominal aortic aneurysm   #2: Proximal extension 1   #3: Bilateral ultrasound-guided common femoral artery access   #4: Catheter in aorta 2   #5: Abdominal aortogram Anesthesia:  Gen. Blood Loss:  See anesthesia record Specimens:  None  Findings:  Angulation at the infrarenal neck require placement of a proximal cuff. Devices used: Main body was a Gore left 31 x 14 x 13.  Ipsilateral extension was a Gore 14 x 10.  Contralateral extension was a right 16 x 13.5.  Proximal cuff was a Gore 36 x 4  Indications:  The patient was seen yesterday in the office by myself and found to have a 5.3 cm infrarenal aneurysm.  This was discovered when he had a CT scan in the ER over the weekend for abdominal pain.  He is also found to have a large renal stone.  He states he's been having pain for approximately 8 months.  Yesterday he developed worsening pain and came to the ER again.  A repeat CT scan showed no interval change.  Because of the patient's persistent abdominal pain and large aneurysm, we have decided to proceed with aneurysm repair  Procedure:  The patient was identified in the holding area and taken to [facility]  The patient was then placed supine on the table. general anesthesia was administered.  The patient was prepped and draped in the usual sterile fashion.  A time out was called and antibiotics were administered.  Ultrasound was used to evaluate bilateral common femoral arteries which are widely patent without significant calcification.  A #11 blade was used to make a skin nick.  Bilateral common femoral arteries were then cannulated under ultrasound guidance.  An 035 wires were advanced without resistance.  Provide devices were deployed at  the 11:00 and 1:00 position for pre-closure.  8 French sheaths were placed bilaterally.  The patient was fully heparinized.  Amplatz superstiff wires were placed on both sides.  An 67 French sheath was advanced up the left side.  I did have difficulty getting wire access into the suprarenal aorta.  Once this was achieved a Omni flush cath was advanced up from the right.  The main body device was prepared on the back table.  This was a Gore 31 x 14 x 13 device.  A abdominal aortogram was performed locating the renal arteries.  The main body device was then deployed down the contralateral gate.  I did constrain the device to get into a better position.  However, because of the angulation of the renal neck I left the device on the low side with plans to come back in place a proximal cuff.  Next a buddy wire technique was used to cannulate the contralateral gate.  Over the Amplatz superstiff wire a 12 French sheath was advanced into the aorta.  A second cannulation site from the sheath was obtained within the gate was cannulated with a Berenstein 2 catheter and a Careers adviser.  The Berenstein catheter was exchanged out for Omni Flush catheter which was able to freely rotated within the main body, confirming successful cannulation.  Amplatz superstiff wire was in place.  The image detector was rotated to a left anterior oblique ejection and a retrograde injection was performed through the  right groin sheath locating the right hypogastric artery.  The 12 French sheath was then advanced into the contralateral gate.  The contralateral extension was prepared on the back table.  This was a Gore 16 x 13.5 device.  It was advanced through the sheath and then deployed landing just proximal to the right hypogastric artery.  Next the remaining portion of the ipsilateral limb was deployed.  A retrograde injection was performed through the sheath in the left groin and a right anterior oblique projection locating the left hypogastric  artery.  The left extension was then inserted this was a Gore 14 x 10 device.  It was landed at the level of the left hypogastric artery.  Next a Q-50 balloon was used to mold the proximal and distal attachment sites as well as device overlap.  I then through the right groin and inserted a Omni flush catheter.  I selected a 36 x 4 proximal cuff and placed this to the left groin.  An additional abdominal aortogram was performed.  The cuff was positioned at the level of the left renal artery and then deployed.  The Q-50 balloon was then used to mold this area.  A completion arteriogram was then performed.  This showed continued patency of bilateral renal arteries, bilateral hypogastric and external iliac arteries.  There is no evidence of endoleak and there was successful exclusion of the aneurysm.  At this point the decision made to terminate the procedure.  Catheters were removed.  The pro-glide devices were secured over Bentson wires for closure of the arteriotomy.  Hemostasis was satisfactory.  50 mg of protamine was given.  Cautery was used to reapproximate the skin edges and Dermabond was applied.  The patient had palpable pedal pulses years he is successfully extubated and taken to the recovery room in stable condition.   Disposition:  To PACU in stable condition.   Theotis Burrow, M.D. [facility] Office: [phone] Pager:  [phone]

## 2016-03-28 ENCOUNTER — Encounter (HOSPITAL_COMMUNITY): Payer: BLUE CROSS/BLUE SHIELD

## 2016-03-28 ENCOUNTER — Encounter (HOSPITAL_COMMUNITY): Payer: Self-pay | Admitting: Surgery

## 2016-03-28 ENCOUNTER — Other Ambulatory Visit: Payer: Self-pay | Admitting: *Deleted

## 2016-03-28 ENCOUNTER — Telehealth: Payer: Self-pay | Admitting: Surgery

## 2016-03-28 ENCOUNTER — Inpatient Hospital Stay (HOSPITAL_COMMUNITY): Admission: RE | Admit: 2016-03-28 | Payer: BLUE CROSS/BLUE SHIELD | Source: Ambulatory Visit

## 2016-03-28 DIAGNOSIS — I714 Abdominal aortic aneurysm, without rupture, unspecified: Secondary | ICD-10-CM

## 2016-03-28 DIAGNOSIS — Z95828 Presence of other vascular implants and grafts: Secondary | ICD-10-CM

## 2016-03-28 LAB — BASIC METABOLIC PANEL
ANION GAP: 5 (ref 5–15)
BUN: 17 mg/dL (ref 6–20)
CALCIUM: 8.2 mg/dL — AB (ref 8.9–10.3)
CO2: 24 mmol/L (ref 22–32)
Chloride: 108 mmol/L (ref 101–111)
Creatinine, Ser: 1.4 mg/dL — ABNORMAL HIGH (ref 0.61–1.24)
GFR calc Af Amer: >60 mL/min (ref >60)
GFR calc non Af Amer: 52 mL/min — ABNORMAL LOW (ref >60)
GLUCOSE: 121 mg/dL — AB (ref 65–99)
POTASSIUM: 4.3 mmol/L (ref 3.5–5.1)
Sodium: 137 mmol/L (ref 135–145)

## 2016-03-28 LAB — CBC
HEMATOCRIT: 44.6 % (ref 39.0–52.0)
Hemoglobin: 14.7 g/dL (ref 13.0–17.0)
MCH: 29.7 pg (ref 26.0–34.0)
MCHC: 33 g/dL (ref 30.0–36.0)
MCV: 90.1 fL (ref 78.0–100.0)
Platelets: 243 10*3/uL (ref 150–400)
RBC: 4.95 MIL/uL (ref 4.22–5.81)
RDW: 14.3 % (ref 11.5–15.5)
WBC: 14.1 10*3/uL — AB (ref 4.0–10.5)

## 2016-03-28 LAB — URINE CULTURE: Culture: NO GROWTH

## 2016-03-28 MED ORDER — SODIUM CHLORIDE 0.9 % IV SOLN
INTRAVENOUS | Status: DC
  Administered 2016-03-28: 08:00:00 via INTRAVENOUS

## 2016-03-28 MED ORDER — CIPROFLOXACIN HCL 250 MG PO TABS: 250.0000 mg | ORAL_TABLET | Freq: Two times a day (BID) | ORAL | 0 refills | Status: DC

## 2016-03-28 MED ORDER — HYDROCODONE-ACETAMINOPHEN 5-325 MG PO TABS: 1.0000 | ORAL_TABLET | ORAL | 0 refills | Status: DC | PRN

## 2016-03-28 MED ORDER — ATORVASTATIN CALCIUM 10 MG PO TABS: 10.0000 mg | ORAL_TABLET | Freq: Every day | ORAL | 5 refills | Status: DC

## 2016-03-28 NOTE — Progress Notes (Signed)
  [facility] Progress Note  Subjective  - POD #1  Had some left sided abdominal pain radiating down to scrotum last night. Same as pre-op pain. Also had nausea. Feeling better this morning.   Objective Vitals:   03/28/16 0700 03/28/16 0753  BP: 133/70 130/84  Pulse: 68 77  Resp: 18 14  Temp:  99.1 F (37.3 C)    Intake/Output Summary (Last 24 hours) at 03/28/16 0815 Last data filed at 03/28/16 0400  Gross per 24 hour  Intake             1640 ml  Output              615 ml  Net             1025 ml   Abdomen soft and non tender. Groins without hematoma. Palpable DP pulses bilaterally  Assessment/Planning: 65 y.o. male is s/p: endovascular stent graft repair AAA 1 Day Post-Op   Still with some left sided abdominal pain radiating down to genitals this am. Pain is likely from kidney stone. Creatinine elevated at 1.4 this am. Baseline 1.0-1.1. Likely from contrast intraop. Will gently hydrate this am. On cipro for possible UTI. Culture pending.  Ambulate and get OOB this am.  Need to see if he can tolerate breakfast.  Will evaluate again this afternoon for potential d/c versus transfer to floor.   Alvia Grove 03/28/2016 8:15 AM --  Laboratory CBC    Component Value Date/Time   WBC 13.9 (H) 03/27/2016 2110   HGB 14.5 03/27/2016 2110   HCT 42.2 03/27/2016 2110   PLT 240 03/27/2016 2110    BMET    Component Value Date/Time   NA 137 03/28/2016 0545   K 4.3 03/28/2016 0545   CL 108 03/28/2016 0545   CO2 24 03/28/2016 0545   GLUCOSE 121 (H) 03/28/2016 0545   BUN 17 03/28/2016 0545   CREATININE 1.40 (H) 03/28/2016 0545   CALCIUM 8.2 (L) 03/28/2016 0545   GFRNONAA 52 (L) 03/28/2016 0545   GFRAA >60 03/28/2016 0545    COAG Lab Results  Component Value Date   INR 1.11 03/27/2016   INR 1.02 03/27/2016   INR 1.03 03/27/2016   No results found for: PTT  Antibiotics Anti-infectives    Start     Dose/Rate Route Frequency Ordered Stop     03/27/16 2030  vancomycin (VANCOCIN) IVPB 1000 mg/200 mL premix     1,000 mg 200 mL/hr over 60 Minutes Intravenous Every 12 hours 03/27/16 2026 03/28/16 2029   03/27/16 2030  ciprofloxacin (CIPRO) tablet 250 mg     250 mg Oral 2 times daily 03/27/16 2025     03/27/16 1300  vancomycin (VANCOCIN) IVPB 1000 mg/200 mL premix     1,000 mg 200 mL/hr over 60 Minutes Intravenous To ShortStay Surgical 03/27/16 0221 03/27/16 1150   03/27/16 1230  vancomycin (VANCOCIN) IVPB 1000 mg/200 mL premix  Status:  Discontinued     1,000 mg 200 mL/hr over 60 Minutes Intravenous To ShortStay Surgical 03/27/16 0740 03/27/16 0750       Virgina Jock, PA-C [facility] Office: [phone] Pager: [phone] 03/28/2016 8:15 AM    Agree with the above Ok for discharge   [employer]

## 2016-03-28 NOTE — Telephone Encounter (Signed)
-----   Message from Mena Goes, RN sent at 03/28/2016 10:51 AM EDT ----- Regarding: 4 weeks w/ CTA   ----- Message ----- From: Alvia Grove, PA-C Sent: 03/28/2016  10:33 AM To: Vvs Charge Pool  S/p [facility] 03/27/16  F/u with Dr. Trula Slade in 4 weeks with CTA  Thanks Maudie Mercury

## 2016-03-28 NOTE — Telephone Encounter (Signed)
Sched CTA 04/29/16 at 11:30 at [place]. Sched MD 04/29/16 at 1:00. Lm on hm# to inform pt of appts.

## 2016-03-29 ENCOUNTER — Encounter (HOSPITAL_COMMUNITY): Payer: BLUE CROSS/BLUE SHIELD

## 2016-04-02 ENCOUNTER — Ambulatory Visit: Payer: BLUE CROSS/BLUE SHIELD | Admitting: Cardiology

## 2016-04-02 NOTE — Progress Notes (Deleted)
Electrophysiology Office Note   Date:  04/02/2016   ID:  Anthony Pierce, DOB 1951/04/17, MRN [id number]  PCP:  Velna Hatchet, MD  Cardiologist:  *** Primary Electrophysiologist: *** Braylin Xu Meredith Leeds, MD    No chief complaint on file.    History of Present Illness: Anthony Pierce is a 65 y.o. male who presents today for electrophysiology evaluation.   History of AAA s/p repair 10/25/17maintenance Coumadin.    Today, he denies*** symptoms of palpitations, chest pain, shortness of breath, orthopnea, PND, lower extremity edema, claudication, dizziness, presyncope, syncope, bleeding, or neurologic sequela. The patient is tolerating medications without difficulties and is otherwise without complaint today.    Past Medical History:  Diagnosis Date  . Diverticulosis   . Hemorrhoids   . Kidney stones    Past Surgical History:  Procedure Laterality Date  . ABDOMINAL AORTIC ENDOVASCULAR STENT GRAFT Bilateral 03/27/2016   Procedure: ABDOMINAL AORTIC ENDOVASCULAR STENT GRAFT;  Surgeon: Serafina Mitchell, MD;  Location: [facility];  Service: Vascular;  Laterality: Bilateral;     Current Outpatient Prescriptions  Medication Sig Dispense Refill  . acetaminophen (TYLENOL) 500 MG tablet Take 500-1,000 mg by mouth every 6 (six) hours as needed (for pain).    Marland Kitchen aspirin EC 81 MG tablet Take 81 mg by mouth daily.    Marland Kitchen atorvastatin (LIPITOR) 10 MG tablet Take 1 tablet (10 mg total) by mouth daily. 30 tablet 5  . ciprofloxacin (CIPRO) 250 MG tablet Take 1 tablet (250 mg total) by mouth 2 (two) times daily. 14 tablet 0  . HYDROcodone-acetaminophen (NORCO) 5-325 MG tablet Take 1 tablet by mouth every 4 (four) hours as needed. 30 tablet 0   No current facility-administered medications for this visit.     Allergies:   Penicillins   Social History:  The patient  reports that he has been smoking Cigarettes.  He has never used smokeless tobacco.   Family History:  The patient's ***family history is  not on file.    ROS:  Please see the history of present illness.   Otherwise, review of systems is positive for ***.   All other systems are reviewed and negative.    PHYSICAL EXAM: VS:  There were no vitals taken for this visit. , BMI There is no height or weight on file to calculate BMI. GEN: Well nourished, well developed, in no acute distress  HEENT: normal  Neck: no JVD, carotid bruits, or masses Cardiac: ***RRR; no murmurs, rubs, or gallops,no edema  Respiratory:  clear to auscultation bilaterally, normal work of breathing GI: soft, nontender, nondistended, + BS MS: no deformity or atrophy  Skin: warm and dry Neuro:  Strength and sensation are intact Psych: euthymic mood, full affect  EKG:  EKG {ACTION; IS/IS [id number] ordered today. Personal review of the ekg ordered shows ***  Recent Labs: 03/27/2016: ALT 12; Magnesium 1.8 03/28/2016: BUN 17; Creatinine, Ser 1.40; Hemoglobin 14.7; Platelets 243; Potassium 4.3; Sodium 137    Lipid Panel  No results found for: CHOL, TRIG, HDL, CHOLHDL, VLDL, LDLCALC, LDLDIRECT   Wt Readings from Last 3 Encounters:  03/27/16 220 lb 12.8 oz (100.2 kg)  03/25/16 227 lb (103 kg)      Other studies Reviewed: Additional studies/ records that were reviewed today include: ***  Review of the above records today demonstrates: ***   ASSESSMENT AND PLAN:  1.  ***    Current medicines are reviewed at length with the patient today.   The patient {ACTIONS;  HAS/DOES NOT HAVE:19233} concerns regarding his medicines.  The following changes were made today:  {NONE DEFAULTED:18576::"none"}  Labs/ tests ordered today include: *** No orders of the defined types were placed in this encounter.    Disposition:   FU with *** {gen number [id number] {Days to years:10300}  Signed, Autry Prust Meredith Leeds, MD  04/02/2016 1:34 PM     [address] [place] [address] [phone]  (office) [phone] (fax)

## 2016-04-04 ENCOUNTER — Encounter (HOSPITAL_COMMUNITY): Payer: Self-pay | Admitting: Surgery

## 2016-04-04 NOTE — Discharge Summary (Signed)
[facility] Discharge Summary  Anthony Pierce 11/10/1950 65 y.o. male  [id number]  Admission Date: 03/26/2016  Discharge Date: 03/28/2016  Physician: Annamarie Major, MD  Admission Diagnosis: Ureteral colic 123XX123  HPI:   This is a 65 y.o. male who Dr. Trula Slade last saw in the office on 03/25/16 for a AAA.Marland Kitchen  He called the office the following day with worsening abdominal pain and was told to goto the ER.  He took a pain pill and the pain improved somewhat. He saw urology today who doesn't think his symptoms are related to his renal stone.  He does have a history of diverticulitis.  He recently underwent a CT scan to evaluate for a kidney stone and it was found that his aneurysm has increased significantly in size. Approximately 3 years ago it measured 3.2 cm. He was seen by vascular surgery and once the [facility] but did not seek follow-up. Most recent CT scan shows an increase in size up to 5.3 cm.  He was started on a statin.  He continues to smoke   Hospital Course:  The patient was admitted to the hospital and taken to the operating room on 03/27/2016 and underwent:    #1: Endovascular repair of abdominal aortic aneurysm #2: Proximal extension 1 #3: Bilateral ultrasound-guided common femoral artery access #4: Catheter in aorta 2 #5: Abdominal aortogram  The patient tolerated the procedure well and was transported to the PACU in stable condition.   Post-operatively, the patient continued to have left sided abdominal pain radiating down to his scrotum. This was the same pain as pre-op. He was also nauseous. On POD 1, the patient felt better but continued to have the same left sided abdominal pain. This was felt to be secondary to nephrolithiasis.  His abdomen was soft and non tender to palpation. His groins were without hematoma. He had palpable DP pulses bilaterally. He had acute kidney injury with mild increase in his creatinine from 1.1 to 1.4. This  was secondary to contrast nephropathy from CT scan dye and introp contrast. He was gently hydrated. He was not on any nephrotoxic meds. His was continued on ciprofloxacin for possible UTI. He was ambulating and tolerating breakfast without difficulty. He was discharged home later on POD 1 in good condition.     CBC    Component Value Date/Time   WBC 14.1 (H) 03/28/2016 0545   RBC 4.95 03/28/2016 0545   HGB 14.7 03/28/2016 0545   HCT 44.6 03/28/2016 0545   PLT 243 03/28/2016 0545   MCV 90.1 03/28/2016 0545   MCH 29.7 03/28/2016 0545   MCHC 33.0 03/28/2016 0545   RDW 14.3 03/28/2016 0545   LYMPHSABS 1.8 03/26/2016 2102   MONOABS 0.5 03/26/2016 2102   EOSABS 0.0 03/26/2016 2102   BASOSABS 0.0 03/26/2016 2102    BMET    Component Value Date/Time   NA 137 03/28/2016 0545   K 4.3 03/28/2016 0545   CL 108 03/28/2016 0545   CO2 24 03/28/2016 0545   GLUCOSE 121 (H) 03/28/2016 0545   BUN 17 03/28/2016 0545   CREATININE 1.40 (H) 03/28/2016 0545   CALCIUM 8.2 (L) 03/28/2016 0545   GFRNONAA 52 (L) 03/28/2016 0545   GFRAA >60 03/28/2016 0545     Discharge Instructions:   The patient is discharged to home with extensive instructions on wound care and progressive ambulation.  They are instructed not to drive or perform any heavy lifting until returning to see the physician in his  office.  Discharge Instructions    Call MD for:  redness, tenderness, or signs of infection (pain, swelling, bleeding, redness, odor or green/yellow discharge around incision site)    Complete by:  As directed    Call MD for:  severe or increased pain, loss or decreased feeling  in affected limb(s)    Complete by:  As directed    Call MD for:  temperature >100.5    Complete by:  As directed    Discharge wound care:    Complete by:  As directed    Take off groin dressings tomorrow. Shower daily and wash groin wounds with soap and water. You do not have to reapply a dressing.   Driving Restrictions     Complete by:  As directed    No driving for 1 week   Lifting restrictions    Complete by:  As directed    No lifting for 4 weeks   Resume previous diet    Complete by:  As directed       Discharge Diagnosis:  Ureteral colic 123XX123  Secondary Diagnosis: Patient Active Problem List   Diagnosis Date Noted  . AAA (abdominal aortic aneurysm) ([place]) 03/27/2016   Past Medical History:  Diagnosis Date  . Diverticulosis   . Hemorrhoids   . Kidney stones        Medication List    TAKE these medications   acetaminophen 500 MG tablet Commonly known as:  TYLENOL Take 500-1,000 mg by mouth every 6 (six) hours as needed (for pain).   aspirin EC 81 MG tablet Take 81 mg by mouth daily.   atorvastatin 10 MG tablet Commonly known as:  LIPITOR Take 1 tablet (10 mg total) by mouth daily.   ciprofloxacin 250 MG tablet Commonly known as:  CIPRO Take 1 tablet (250 mg total) by mouth 2 (two) times daily.   HYDROcodone-acetaminophen 5-325 MG tablet Commonly known as:  NORCO Take 1 tablet by mouth every 4 (four) hours as needed.       Percocet #30 No Refill  Disposition: Home  Patient's condition: is Good  Follow up: 1. Dr. Trula Slade in 4 weeks with CTA   Virgina Jock, PA-C [facility] [phone] 04/04/2016  10:20 AM   - For VQI Registry use --- Instructions: Press F2 to tab through selections.  Delete question if not applicable.   Post-op:  Time to Extubation: [x ] In OR, [ ]  < 12 hrs, [ ]  12-24 hrs, [ ]  >=24 hrs Vasopressors Req. Post-op: No MI: No., [ ]  Troponin only, [ ]  EKG or Clinical New Arrhythmia: No CHF: No ICU Stay: 1 days Transfusion: No    Complications: Resp failure: No., [ ]  Pneumonia, [ ]  Ventilator Chg in renal function: Yes.  , [ ]  Inc. Cr > 0.5, [ ]  Temp. Dialysis, [ ]  Permanent dialysis Leg ischemia: No., no Surgery needed, [ ]  Yes, Surgery needed, [ ]  Amputation Bowel ischemia: No., [ ]  Medical Rx, [ ]  Surgical Rx Wound  complication: No., [ ]  Superficial separation/infection, [ ]  Return to OR Return to OR: No  Return to OR for bleeding: No Stroke: No., [ ]  Minor, [ ]  Major  Discharge medications: Statin use:  Yes If No: [ ]  For Medical reasons, [ ]  Non-compliant, [ ]  Not-indicated ASA use:  Yes  If No: [ ]  For Medical reasons, [ ]  Non-compliant, [ ]  Not-indicated Plavix use:  No If No: [ ]  For Medical reasons, [ ]  Non-compliant, [  x ] Not-indicated Beta blocker use:  No If No: [ ]  For Medical reasons, [ ]  Non-compliant, [x ] Not-indicated

## 2016-04-05 ENCOUNTER — Encounter (HOSPITAL_COMMUNITY): Payer: Self-pay | Admitting: Surgery

## 2016-04-10 ENCOUNTER — Inpatient Hospital Stay: Admit: 2016-04-10 | Payer: BLUE CROSS/BLUE SHIELD | Admitting: Surgery

## 2016-04-10 SURGERY — INSERTION, ENDOVASCULAR STENT GRAFT, AORTA, ABDOMINAL
Anesthesia: General

## 2016-04-17 ENCOUNTER — Encounter: Payer: Self-pay | Admitting: Surgery

## 2016-04-19 ENCOUNTER — Telehealth: Payer: Self-pay | Admitting: *Deleted

## 2016-04-19 NOTE — Telephone Encounter (Signed)
Return ed call to patient.  Patient was requesting additional pain medication.  He states that Dr Trula Slade knows all about his kidney stones.  I explained to him that our office policy is such that he would have had been seen today to be evaluated.  He asked to speak to Dr Trula Slade and I explained that he was in the operating and also told him that his surgery was on 03/27/16 and this being too far out to prescribe pain meds.  Patient asked what he should do and I suggested ED or Urgent Care being he stated he does not have a PCP.

## 2016-04-23 ENCOUNTER — Emergency Department (HOSPITAL_COMMUNITY): Payer: BLUE CROSS/BLUE SHIELD

## 2016-04-23 ENCOUNTER — Telehealth: Payer: Self-pay

## 2016-04-23 ENCOUNTER — Encounter (HOSPITAL_COMMUNITY): Payer: Self-pay | Admitting: Emergency Medicine

## 2016-04-23 ENCOUNTER — Emergency Department (HOSPITAL_COMMUNITY)
Admission: EM | Admit: 2016-04-23 | Discharge: 2016-04-23 | Disposition: A | Discharge status: Home / Self Care | Payer: BLUE CROSS/BLUE SHIELD | Attending: Emergency Medicine | Admitting: Emergency Medicine

## 2016-04-23 DIAGNOSIS — N200 Calculus of kidney: Secondary | ICD-10-CM

## 2016-04-23 DIAGNOSIS — Z7982 Long term (current) use of aspirin: Secondary | ICD-10-CM | POA: Insufficient documentation

## 2016-04-23 DIAGNOSIS — K5731 Diverticulosis of large intestine without perforation or abscess with bleeding: Secondary | ICD-10-CM | POA: Insufficient documentation

## 2016-04-23 DIAGNOSIS — R1032 Left lower quadrant pain: Secondary | ICD-10-CM | POA: Diagnosis present

## 2016-04-23 DIAGNOSIS — K5791 Diverticulosis of intestine, part unspecified, without perforation or abscess with bleeding: Secondary | ICD-10-CM

## 2016-04-23 DIAGNOSIS — F17210 Nicotine dependence, cigarettes, uncomplicated: Secondary | ICD-10-CM | POA: Insufficient documentation

## 2016-04-23 DIAGNOSIS — K625 Hemorrhage of anus and rectum: Secondary | ICD-10-CM

## 2016-04-23 LAB — URINALYSIS, ROUTINE W REFLEX MICROSCOPIC
Bilirubin Urine: NEGATIVE
Glucose, UA: NEGATIVE mg/dL
Ketones, ur: NEGATIVE mg/dL
Nitrite: NEGATIVE
PH: 6 (ref 5.0–8.0)
Protein, ur: NEGATIVE mg/dL
SPECIFIC GRAVITY, URINE: 1.008 (ref 1.005–1.030)

## 2016-04-23 LAB — COMPREHENSIVE METABOLIC PANEL
ALK PHOS: 65 U/L (ref 38–126)
ALT: 15 U/L — AB (ref 17–63)
AST: 16 U/L (ref 15–41)
Albumin: 3.6 g/dL (ref 3.5–5.0)
Anion gap: 7 (ref 5–15)
BUN: 22 mg/dL — AB (ref 6–20)
CALCIUM: 8.6 mg/dL — AB (ref 8.9–10.3)
CO2: 24 mmol/L (ref 22–32)
CREATININE: 1.37 mg/dL — AB (ref 0.61–1.24)
Chloride: 108 mmol/L (ref 101–111)
GFR calc non Af Amer: 53 mL/min — ABNORMAL LOW (ref >60)
GLUCOSE: 105 mg/dL — AB (ref 65–99)
Potassium: 3.6 mmol/L (ref 3.5–5.1)
SODIUM: 139 mmol/L (ref 135–145)
Total Bilirubin: 0.4 mg/dL (ref 0.3–1.2)
Total Protein: 6.8 g/dL (ref 6.5–8.1)

## 2016-04-23 LAB — CBC
HCT: 39.9 % (ref 39.0–52.0)
Hemoglobin: 13.6 g/dL (ref 13.0–17.0)
MCH: 30.5 pg (ref 26.0–34.0)
MCHC: 34.1 g/dL (ref 30.0–36.0)
MCV: 89.5 fL (ref 78.0–100.0)
PLATELETS: 254 10*3/uL (ref 150–400)
RBC: 4.46 MIL/uL (ref 4.22–5.81)
RDW: 14.2 % (ref 11.5–15.5)
WBC: 7.8 10*3/uL (ref 4.0–10.5)

## 2016-04-23 LAB — TYPE AND SCREEN
ABO/RH(D): A POS
ANTIBODY SCREEN: NEGATIVE

## 2016-04-23 LAB — URINE MICROSCOPIC-ADD ON: SQUAMOUS EPITHELIAL / LPF: NONE SEEN

## 2016-04-23 LAB — LIPASE, BLOOD: Lipase: 35 U/L (ref 11–51)

## 2016-04-23 MED ORDER — HYDROCODONE-ACETAMINOPHEN 5-325 MG PO TABS: 1.0000 | ORAL_TABLET | ORAL | 0 refills | Status: DC | PRN

## 2016-04-23 MED ORDER — CIPROFLOXACIN HCL 500 MG PO TABS: 500.0000 mg | ORAL_TABLET | Freq: Two times a day (BID) | ORAL | 0 refills | Status: DC

## 2016-04-23 MED ORDER — MORPHINE SULFATE (PF) 4 MG/ML IV SOLN
4.0000 mg | Freq: Once | INTRAVENOUS | Status: AC
  Administered 2016-04-23: 4 mg via INTRAVENOUS
  Filled 2016-04-23: qty 1

## 2016-04-23 MED ORDER — METRONIDAZOLE 500 MG PO TABS: 500.0000 mg | ORAL_TABLET | Freq: Three times a day (TID) | ORAL | 0 refills | Status: DC

## 2016-04-23 MED ORDER — SODIUM CHLORIDE 0.9 % IV BOLUS (SEPSIS)
500.0000 mL | Freq: Once | INTRAVENOUS | Status: AC
  Administered 2016-04-23: 500 mL via INTRAVENOUS

## 2016-04-23 MED ORDER — IOPAMIDOL (ISOVUE-300) INJECTION 61%
INTRAVENOUS | Status: AC
  Administered 2016-04-23: 100 mL
  Filled 2016-04-23: qty 100

## 2016-04-23 NOTE — Discharge Instructions (Signed)
Follow up with your primary care doctor next week, follow up with urology as planned, return for worsening bleeding

## 2016-04-23 NOTE — ED Triage Notes (Signed)
Pt from home with c/o of worsening LLQ abdominal pain starting yesterday with rectal bleeding starting today.  Pt reports hx of diverticulosis.  NAD, A&O.

## 2016-04-23 NOTE — Telephone Encounter (Signed)
Rec'd phone call from pt.  Reported noticing BRB on toilet tissue this AM, following a BM.  Reported stool appears "dark".  Stated he has had left lower quadrant abd. pain since prior to his Stent graft rep. of AAA.  Reported the pain is constant, and at times becomes "very sharp".  Stated it increases with movement.  Denied temperature, but stated he has had some chills.  Discussed with Dr. Donnetta Hutching.  Recommended to have pt. contact his PCP or go to the ER.  Phone call returned to pt.  Unable to contact by phone; left voice message with recommendations of notifying his PCP or going to the ER.  Advised to call office if any questions.

## 2016-04-23 NOTE — ED Provider Notes (Signed)
[facility] DEPT Provider Note   CSN: [id number] Arrival date & time: 04/23/16  1342     History   Chief Complaint Chief Complaint  Patient presents with  . Abdominal Pain  . Rectal Bleeding    HPI Anthony Pierce is a 65 y.o. male.  HPI Pt had surgery last month for an AAA.  He has had persistent pain in his abdomen since the surgery.  Recently the pain has been increasing in the lower quadrant of the abdomen on the left.  The pain comes and goes.   Sometimes it is severe.  Nol fevers.   Some loose stools the last few episodes.  This morning around 9am he noticed bright red blood in the commode when he had a BM.  Since then no further episodes.  He called his surgeon and was told to come to the ED.   Past Medical History:  Diagnosis Date  . Diverticulosis   . Hemorrhoids   . Kidney stones     Patient Active Problem List   Diagnosis Date Noted  . AAA (abdominal aortic aneurysm) ([place]) 03/27/2016    Past Surgical History:  Procedure Laterality Date  . ABDOMINAL AORTIC ENDOVASCULAR STENT GRAFT Bilateral 03/27/2016   Procedure: ABDOMINAL AORTIC ENDOVASCULAR STENT GRAFT;  Surgeon: Serafina Mitchell, MD;  Location: [facility];  Service: Vascular;  Laterality: Bilateral;       Home Medications    Prior to Admission medications   Medication Sig Start Date End Date Taking? Authorizing Provider  acetaminophen (TYLENOL) 500 MG tablet Take 500-1,000 mg by mouth every 6 (six) hours as needed (for pain).   Yes Historical Provider, MD  aspirin EC 81 MG tablet Take 81 mg by mouth daily.   Yes Historical Provider, MD  atorvastatin (LIPITOR) 10 MG tablet Take 1 tablet (10 mg total) by mouth daily. 03/28/16  Yes Alvia Grove, PA-C  ciprofloxacin (CIPRO) 500 MG tablet Take 1 tablet (500 mg total) by mouth 2 (two) times daily. 04/23/16   Dorie Rank, MD  HYDROcodone-acetaminophen (NORCO/VICODIN) 5-325 MG tablet Take 1 tablet by mouth every 4 (four) hours as needed. 04/23/16   Dorie Rank,  MD  metroNIDAZOLE (FLAGYL) 500 MG tablet Take 1 tablet (500 mg total) by mouth 3 (three) times daily. 04/23/16   Dorie Rank, MD    Family History History reviewed. No pertinent family history.  Social History Social History  Substance Use Topics  . Smoking status: Current Every Day Smoker    Types: Cigarettes  . Smokeless tobacco: Never Used     Comment: 1 pk per day  . Alcohol use Not on file     Allergies   Penicillins   Review of Systems Review of Systems  Constitutional: Negative for fever.  Respiratory: Negative for shortness of breath.   Gastrointestinal: Positive for blood in stool. Negative for vomiting.  Genitourinary: Negative for dysuria.  All other systems reviewed and are negative.    Physical Exam Updated Vital Signs BP 160/97   Pulse 63   Temp 98.4 F (36.9 C) (Oral)   Resp 16   Wt 102.1 kg   SpO2 95%   BMI 27.39 kg/m   Physical Exam  Constitutional: He appears well-developed and well-nourished. No distress.  HENT:  Head: Normocephalic and atraumatic.  Right Ear: External ear normal.  Left Ear: External ear normal.  Eyes: Conjunctivae are normal. Right eye exhibits no discharge. Left eye exhibits no discharge. No scleral icterus.  Neck: Neck supple. No tracheal  deviation present.  Cardiovascular: Normal rate, regular rhythm and intact distal pulses.   Pulmonary/Chest: Effort normal and breath sounds normal. No stridor. No respiratory distress. He has no wheezes. He has no rales.  Abdominal: Soft. Bowel sounds are normal. He exhibits no distension. There is no tenderness. There is no rebound and no guarding.  Musculoskeletal: He exhibits no edema or tenderness.  Neurological: He is alert. He has normal strength. No cranial nerve deficit (no facial droop, extraocular movements intact, no slurred speech) or sensory deficit. He exhibits normal muscle tone. He displays no seizure activity. Coordination normal.  Skin: Skin is warm and dry. No rash  noted.  Psychiatric: He has a normal mood and affect.  Nursing note and vitals reviewed.    ED Treatments / Results  Labs (all labs ordered are listed, but only abnormal results are displayed) Labs Reviewed  COMPREHENSIVE METABOLIC PANEL - Abnormal; Notable for the following:       Result Value   Glucose, Bld 105 (*)    BUN 22 (*)    Creatinine, Ser 1.37 (*)    Calcium 8.6 (*)    ALT 15 (*)    GFR calc non Af Amer 53 (*)    All other components within normal limits  URINALYSIS, ROUTINE W REFLEX MICROSCOPIC (NOT AT [facility]) - Abnormal; Notable for the following:    Hgb urine dipstick MODERATE (*)    Leukocytes, UA SMALL (*)    All other components within normal limits  URINE MICROSCOPIC-ADD ON - Abnormal; Notable for the following:    Bacteria, UA RARE (*)    All other components within normal limits  LIPASE, BLOOD  CBC  POC OCCULT BLOOD, ED  TYPE AND SCREEN     Radiology Ct Abdomen Pelvis W Contrast  Result Date: 04/23/2016 CLINICAL DATA:  65 year old male with left lower quadrant pain and blood in stool. Treatment for abdominal aortic aneurysm in October. Initial encounter. EXAM: CT ABDOMEN AND PELVIS WITH CONTRAST TECHNIQUE: Multidetector CT imaging of the abdomen and pelvis was performed using the standard protocol following bolus administration of intravenous contrast. CONTRAST:  165mL ISOVUE-300 IOPAMIDOL (ISOVUE-300) INJECTION 61% COMPARISON:  CT Abdomen and Pelvis 03/26/2016 and earlier. FINDINGS: Lower chest: Mild dependent atelectasis in the lower lobes is new from the prior. Mild cardiomegaly. No pericardial or pleural effusion. No upper abdominal free air. Hepatobiliary: Extensive cholelithiasis. The gallbladder remains contracted. No pericholecystic inflammation. Negative liver. No biliary ductal enlargement. Pancreas: Negative. Spleen: Negative. Adrenals/Urinary Tract: Negative adrenal glands. Extensive Bilateral nephrolithiasis. 16 mm calculus remains in the left renal  pelvis, unchanged from 03/26/2016. Right renal lower pole simple cyst incidentally re- demonstrated. Bilateral renal enhancement and contrast excretion is symmetric and within normal limits, including into the proximal left ureter. Diminutive, unremarkable urinary bladder. Stomach/Bowel: Negative rectum. Severe sigmoid diverticulosis which continues into the descending colon nearly to the splenic flexure. These colonic segments appear stable since October, no active inflammation identified. Mild diverticulosis in the transverse colon. Negative right colon and appendix. Negative terminal ileum. No dilated small bowel. Decompressed stomach. Negative duodenum. Vascular/Lymphatic: Interval endovascular treatment of the 5.2 cm infrarenal abdominal aortic aneurysm with bifurcated endograft. The endograft is patent. The native aneurysm sac has not significantly changed. Comparison of portal venous and delayed phase density within the native sac suggests no enhancement/leak. Major pelvic arterial structures appear patent. Portal venous system is patent. No lymphadenopathy. Reproductive: Negative. Other: No pelvic free fluid. Musculoskeletal: Chronic L5 pars fractures and mild grade 1 anterolisthesis at  the lumbosacral junction again noted. Stable visualized osseous structures. IMPRESSION: 1. Interval treatment of infrarenal abdominal aortic aneurysm with bifurcated endograft. No adverse features. Stable native aneurysm sac size. Routine CTA follow-up as per Vascular Surgery. 2. Severe diverticulosis of the descending and sigmoid colon, stable in appearance since October. No diverticulitis or active inflammation identified. 3. Extensive nephrolithiasis and cholelithiasis. Large 16 mm calculus in the left renal pelvis is unchanged since 03/26/2016 and is not obstructing at the time of this scan. Electronically Signed   By: Genevie Ann M.D.   On: 04/23/2016 18:35    Procedures Procedures (including critical care  time)  Medications Ordered in ED Medications  sodium chloride 0.9 % bolus 500 mL (500 mLs Intravenous New Bag/Given 04/23/16 1643)  morphine 4 MG/ML injection 4 mg (4 mg Intravenous Given 04/23/16 1643)  iopamidol (ISOVUE-300) 61 % injection (100 mLs  Contrast Given 04/23/16 1808)     Initial Impression / Assessment and Plan / ED Course  I have reviewed the triage vital signs and the nursing notes.  Pertinent labs & imaging results that were available during my care of the patient were reviewed by me and considered in my medical decision making (see chart for details).  Clinical Course as of Apr 23 1941  Tue Apr 23, 2016  1610 Pt has plans to see urologist in December.   Plans were to follow along after his aorta surgery.  [JK]  1940 Urine culture from 10/25 showed no growth.  Previous urine samples last month showed same 6-30 wbc  [JK]  1941 Labs reviewed.  Persistent pyuria.  CT scan without acute findings.  May be related to mild diverticulitis not noted on CT scan or related to the urinary tract.  NO further rectal bleeding. Heme stable.  Dc home with abx.  Follow up with PCP and urology  [JK]    Clinical Course User Index [JK] Dorie Rank, MD    Final Clinical Impressions(s) / ED Diagnoses   Final diagnoses:  Rectal bleeding  Diverticulosis of intestine with bleeding, unspecified intestinal tract location  Renal stone    New Prescriptions New Prescriptions   CIPROFLOXACIN (CIPRO) 500 MG TABLET    Take 1 tablet (500 mg total) by mouth 2 (two) times daily.   HYDROCODONE-ACETAMINOPHEN (NORCO/VICODIN) 5-325 MG TABLET    Take 1 tablet by mouth every 4 (four) hours as needed.   METRONIDAZOLE (FLAGYL) 500 MG TABLET    Take 1 tablet (500 mg total) by mouth 3 (three) times daily.     Dorie Rank, MD 04/23/16 [id number]

## 2016-04-29 ENCOUNTER — Encounter: Payer: Self-pay | Admitting: Surgery

## 2016-04-29 ENCOUNTER — Ambulatory Visit
Admit: 2016-04-29 | Discharge: 2016-04-29 | Disposition: A | Discharge status: Home / Self Care | Payer: BLUE CROSS/BLUE SHIELD | Attending: Surgery | Admitting: Surgery

## 2016-04-29 ENCOUNTER — Ambulatory Visit (INDEPENDENT_AMBULATORY_CARE_PROVIDER_SITE_OTHER): Payer: Self-pay | Admitting: Surgery

## 2016-04-29 VITALS — BP 160/97 | HR 81 | Resp 20 | Ht 74.0 in | Wt 230.5 lb

## 2016-04-29 DIAGNOSIS — I714 Abdominal aortic aneurysm, without rupture, unspecified: Secondary | ICD-10-CM

## 2016-04-29 DIAGNOSIS — Z95828 Presence of other vascular implants and grafts: Secondary | ICD-10-CM

## 2016-04-29 MED ORDER — IOPAMIDOL (ISOVUE-300) INJECTION 61%
100.0000 mL | Freq: Once | INTRAVENOUS | Status: AC | PRN
  Administered 2016-04-29: 100 mL via INTRAVENOUS

## 2016-04-29 MED ORDER — HYDROCODONE-ACETAMINOPHEN 5-325 MG PO TABS: 1.0000 | ORAL_TABLET | ORAL | 0 refills | Status: DC | PRN

## 2016-04-29 NOTE — Addendum Note (Signed)
Addended by: Lianne Cure A on: 04/29/2016 02:53 PM   Modules accepted: Orders

## 2016-04-29 NOTE — Progress Notes (Signed)
Patient name: Anthony Pierce MRN: [id number] DOB: 01-Apr-1951 Sex: male  REASON FOR VISIT: post-op  HPI: Anthony Pierce is a 65 y.o. male who presents for postoperative follow-up status post endovascular repair of his infrarenal abdominal aortic aneurysm on 03/27/2016. Prior to his surgery, the patient was complaining of abdominal pain in his left lower quadrant that radiated to his scrotum. His CT scan did not reveal diverticulitis but did reveal a left kidney stone. He was seen by urology who felt that his abdominal pain was not related to his kidney stone. Stent graft repair of his aneurysm was done to rule out his aneurysm as being the source of his pain.  Postoperatively, he continued to have left sided and abdominal pain that radiated down to his scrotum. He felt this is the same as his preoperative pain. He was discharged home on postop day 1 with ciprofloxacin for a possible UTI.  Since his surgery, the patient states that his left lower quadrant pain that radiates to the scrotum has resolved. He now has intermittent left sided abdominal pain. He was seen in the emergency department on 04/23/2016 due to abdominal pain and bright red blood per rectum. He was diagnosed with diverticulosis with bleeding. He was given ciprofloxacin and metronidazole.  He states that his groin sites are well-healed. He denies any pain with his legs.   Current Outpatient Prescriptions  Medication Sig Dispense Refill  . acetaminophen (TYLENOL) 500 MG tablet Take 500-1,000 mg by mouth every 6 (six) hours as needed (for pain).    Marland Kitchen aspirin EC 81 MG tablet Take 81 mg by mouth daily.    Marland Kitchen atorvastatin (LIPITOR) 10 MG tablet Take 1 tablet (10 mg total) by mouth daily. 30 tablet 5  . ciprofloxacin (CIPRO) 500 MG tablet Take 1 tablet (500 mg total) by mouth 2 (two) times daily. 20 tablet 0  . HYDROcodone-acetaminophen (NORCO/VICODIN) 5-325 MG tablet Take 1 tablet by mouth every 4 (four) hours as needed. 15 tablet 0  .  metroNIDAZOLE (FLAGYL) 500 MG tablet Take 1 tablet (500 mg total) by mouth 3 (three) times daily. 21 tablet 0   No current facility-administered medications for this visit.     REVIEW OF SYSTEMS:  [X]  denotes positive finding, [ ]  denotes negative finding Cardiac  Comments:  Chest pain or chest pressure:    Shortness of breath upon exertion:    Short of breath when lying flat:    Irregular heart rhythm:    Constitutional    Fever or chills:      PHYSICAL EXAM: Vitals:   04/29/16 1320 04/29/16 1322  BP: (!) 159/99 (!) 160/97  Pulse: 81   Resp: 20   SpO2: 97%   Weight: 230 lb 8 oz (104.6 kg)   Height: 6\' 2"  (1.88 m)     GENERAL: The patient is a well-nourished male, in no acute distress. The vital signs are documented above. PULMONARY: Non-labored respiratory effort. ABDOMEN: Abdomen is soft and nontender.  DATA:   CTA abdomen pelvis 04/29/2016  Stent graft is in good position. Aneurysm sac is stable at 5.2 cm. There is no evidence of endoleak. There is bilateral nephrolithiasis.   MEDICAL ISSUES: Status post endovascular repair abdominal aortic aneurysm  The patient continues to have left sided abdominal pain post aneurysm repair. Feel that his pain is likely related related to his kidney stones. The patient has follow-up with urology next month. He was recently seen in the [facility] ER and prescribed antibiotics  for diverticulosis with intestinal bleeding and UTI. Advised the patient to continue on antibiotics given stent graft until seen by urology. He'll follow-up in 6 months with a duplex.   The patient was prescribed Norco 5/325 #15 no refills.   Virgina Jock, PA-C [facility]   I agree with the above.  The patient will follow up in 6 months with an abdominal ultrasound  Wells Josslyn Ciolek

## 2016-06-05 ENCOUNTER — Other Ambulatory Visit: Payer: Self-pay | Admitting: Urology

## 2016-07-05 NOTE — Patient Instructions (Addendum)
JANES STALLWORTH  07/05/2016   Your procedure is scheduled on: 07-11-16  Report to [facility]  Entrance take [facility]  elevators to 3rd floor to  [place] at 530AM.  Call this number if you have problems the morning of surgery [phone]   Remember: ONLY 1 PERSON MAY GO WITH YOU TO SHORT STAY TO GET  READY MORNING OF [place].  Do not eat food:After [place], CLEAR LIQUIDS ALL DAY Wednesday 07-10-16, NOTHING AFTER MIDNIGHT Wednesday NIGHT, FOLLOW ALL BOWEL PREP INSTRUCTIONS FROM DR Louis Meckel.     Take these medicines the morning of surgery with A SIP OF WATER:HYDROCONE IF NEEDED                               You may not have any metal on your body including hair pins and              piercings  Do not wear jewelry, make-up, lotions, powders or perfumes, deodorant             Do not wear nail polish.  Do not shave  48 hours prior to surgery.              Men may shave face and neck.   Do not bring valuables to the hospital. [place].  Contacts, dentures or bridgework may not be worn into surgery.  Leave suitcase in the car. After surgery it may be brought to your room.              Please read over the following fact sheets you were given: _____________________________________________________________________             [facility] - Preparing for Surgery Before surgery, you can play an important role.  Because skin is not sterile, your skin needs to be as free of germs as possible.  You can reduce the number of germs on your skin by washing with CHG (chlorahexidine gluconate) soap before surgery.  CHG is an antiseptic cleaner which kills germs and bonds with the skin to continue killing germs even after washing. Please DO NOT use if you have an allergy to CHG or antibacterial soaps.  If your skin becomes reddened/irritated stop using the CHG and inform your nurse when you arrive at Short  Stay. Do not shave (including legs and underarms) for at least 48 hours prior to the first CHG shower.  You may shave your face/neck. Please follow these instructions carefully:  1.  Shower with CHG Soap the night before surgery and the  morning of Surgery.  2.  If you choose to wash your hair, wash your hair first as usual with your  normal  shampoo.  3.  After you shampoo, rinse your hair and body thoroughly to remove the  shampoo.                           4.  Use CHG as you would any other liquid soap.  You can apply chg directly  to the skin and wash                       Gently with a  scrungie or clean washcloth.  5.  Apply the CHG Soap to your body ONLY FROM THE NECK DOWN.   Do not use on face/ open                           Wound or open sores. Avoid contact with eyes, ears mouth and genitals (private parts).                       Wash face,  Genitals (private parts) with your normal soap.             6.  Wash thoroughly, paying special attention to the area where your surgery  will be performed.  7.  Thoroughly rinse your body with warm water from the neck down.  8.  DO NOT shower/wash with your normal soap after using and rinsing off  the CHG Soap.                9.  Pat yourself dry with a clean towel.            10.  Wear clean pajamas.            11.  Place clean sheets on your bed the night of your first shower and do not  sleep with pets. Day of Surgery : Do not apply any lotions/deodorants the morning of surgery.  Please wear clean clothes to the hospital/surgery center.  FAILURE TO FOLLOW THESE INSTRUCTIONS MAY RESULT IN THE CANCELLATION OF YOUR SURGERY PATIENT SIGNATURE_________________________________  NURSE SIGNATURE__________________________________  ________________________________________________________________________

## 2016-07-05 NOTE — Progress Notes (Signed)
LOV vascular surgery Dr Trula Slade 04-29-16  epic CXR 03-26-16 epic EKG 03-27-16 epic

## 2016-07-08 ENCOUNTER — Encounter (HOSPITAL_COMMUNITY)
Admission: RE | Admit: 2016-07-08 | Discharge: 2016-07-08 | Disposition: A | Discharge status: Home / Self Care | Payer: BLUE CROSS/BLUE SHIELD | Source: Ambulatory Visit | Attending: Urology | Admitting: Urology

## 2016-07-08 ENCOUNTER — Other Ambulatory Visit: Payer: Self-pay | Admitting: Urology

## 2016-07-08 ENCOUNTER — Encounter (HOSPITAL_COMMUNITY): Payer: Self-pay

## 2016-07-08 DIAGNOSIS — R9431 Abnormal electrocardiogram [ECG] [EKG]: Secondary | ICD-10-CM

## 2016-07-08 DIAGNOSIS — Z01812 Encounter for preprocedural laboratory examination: Secondary | ICD-10-CM | POA: Insufficient documentation

## 2016-07-08 DIAGNOSIS — I739 Peripheral vascular disease, unspecified: Secondary | ICD-10-CM | POA: Diagnosis not present

## 2016-07-08 DIAGNOSIS — F17210 Nicotine dependence, cigarettes, uncomplicated: Secondary | ICD-10-CM | POA: Diagnosis not present

## 2016-07-08 DIAGNOSIS — N202 Calculus of kidney with calculus of ureter: Secondary | ICD-10-CM | POA: Diagnosis present

## 2016-07-08 DIAGNOSIS — Z7982 Long term (current) use of aspirin: Secondary | ICD-10-CM | POA: Diagnosis not present

## 2016-07-08 DIAGNOSIS — Z01810 Encounter for preprocedural cardiovascular examination: Secondary | ICD-10-CM

## 2016-07-08 DIAGNOSIS — E7800 Pure hypercholesterolemia, unspecified: Secondary | ICD-10-CM | POA: Diagnosis not present

## 2016-07-08 DIAGNOSIS — I714 Abdominal aortic aneurysm, without rupture: Secondary | ICD-10-CM | POA: Diagnosis not present

## 2016-07-08 DIAGNOSIS — Z880 Allergy status to penicillin: Secondary | ICD-10-CM | POA: Diagnosis not present

## 2016-07-08 HISTORY — DX: Personal history of urinary calculi: Z87.442

## 2016-07-08 HISTORY — DX: Other specified postprocedural states: Z98.890

## 2016-07-08 LAB — BASIC METABOLIC PANEL
ANION GAP: 6 (ref 5–15)
BUN: 23 mg/dL — ABNORMAL HIGH (ref 6–20)
CHLORIDE: 109 mmol/L (ref 101–111)
CO2: 25 mmol/L (ref 22–32)
Calcium: 8.6 mg/dL — ABNORMAL LOW (ref 8.9–10.3)
Creatinine, Ser: 1.24 mg/dL (ref 0.61–1.24)
Glucose, Bld: 84 mg/dL (ref 65–99)
POTASSIUM: 4 mmol/L (ref 3.5–5.1)
SODIUM: 140 mmol/L (ref 135–145)

## 2016-07-08 LAB — CBC
HEMATOCRIT: 41 % (ref 39.0–52.0)
HEMOGLOBIN: 14 g/dL (ref 13.0–17.0)
MCH: 29.9 pg (ref 26.0–34.0)
MCHC: 34.1 g/dL (ref 30.0–36.0)
MCV: 87.4 fL (ref 78.0–100.0)
Platelets: 274 10*3/uL (ref 150–400)
RBC: 4.69 MIL/uL (ref 4.22–5.81)
RDW: 14.1 % (ref 11.5–15.5)
WBC: 7 10*3/uL (ref 4.0–10.5)

## 2016-07-08 NOTE — Progress Notes (Signed)
EKG 03-27-16 ABNORMAL REPEATED WITH PRE OP 07-08-16

## 2016-07-09 LAB — ABO/RH: ABO/RH(D): A POS

## 2016-07-11 ENCOUNTER — Encounter (HOSPITAL_COMMUNITY): Payer: Self-pay | Admitting: *Deleted

## 2016-07-11 ENCOUNTER — Ambulatory Visit (HOSPITAL_COMMUNITY): Payer: BLUE CROSS/BLUE SHIELD | Admitting: Anesthesiology

## 2016-07-11 ENCOUNTER — Observation Stay (HOSPITAL_COMMUNITY)
Admission: RE | Admit: 2016-07-11 | Discharge: 2016-07-12 | Disposition: A | Discharge status: Home / Self Care | Payer: BLUE CROSS/BLUE SHIELD | Source: Ambulatory Visit | Attending: Urology | Admitting: Urology

## 2016-07-11 ENCOUNTER — Ambulatory Visit (HOSPITAL_COMMUNITY): Payer: BLUE CROSS/BLUE SHIELD

## 2016-07-11 ENCOUNTER — Encounter (HOSPITAL_COMMUNITY)
Admission: RE | Disposition: A | Discharge status: Home / Self Care | Payer: Self-pay | Source: Ambulatory Visit | Attending: Urology

## 2016-07-11 DIAGNOSIS — I739 Peripheral vascular disease, unspecified: Secondary | ICD-10-CM | POA: Insufficient documentation

## 2016-07-11 DIAGNOSIS — I714 Abdominal aortic aneurysm, without rupture: Secondary | ICD-10-CM | POA: Insufficient documentation

## 2016-07-11 DIAGNOSIS — E7800 Pure hypercholesterolemia, unspecified: Secondary | ICD-10-CM | POA: Insufficient documentation

## 2016-07-11 DIAGNOSIS — Z7982 Long term (current) use of aspirin: Secondary | ICD-10-CM | POA: Insufficient documentation

## 2016-07-11 DIAGNOSIS — N202 Calculus of kidney with calculus of ureter: Secondary | ICD-10-CM | POA: Diagnosis not present

## 2016-07-11 DIAGNOSIS — F17210 Nicotine dependence, cigarettes, uncomplicated: Secondary | ICD-10-CM | POA: Insufficient documentation

## 2016-07-11 DIAGNOSIS — Z880 Allergy status to penicillin: Secondary | ICD-10-CM | POA: Insufficient documentation

## 2016-07-11 DIAGNOSIS — N200 Calculus of kidney: Secondary | ICD-10-CM

## 2016-07-11 HISTORY — DX: Cardiac arrhythmia, unspecified: I49.9

## 2016-07-11 HISTORY — PX: NEPHROLITHOTOMY: SHX5134

## 2016-07-11 LAB — HEMOGLOBIN: HEMOGLOBIN: 13.3 g/dL (ref 13.0–17.0)

## 2016-07-11 LAB — TYPE AND SCREEN
ABO/RH(D): A POS
ANTIBODY SCREEN: NEGATIVE

## 2016-07-11 LAB — BASIC METABOLIC PANEL
Anion gap: 7 (ref 5–15)
BUN: 21 mg/dL — ABNORMAL HIGH (ref 6–20)
CO2: 25 mmol/L (ref 22–32)
CREATININE: 1.38 mg/dL — AB (ref 0.61–1.24)
Calcium: 8.1 mg/dL — ABNORMAL LOW (ref 8.9–10.3)
Chloride: 107 mmol/L (ref 101–111)
GFR calc non Af Amer: 53 mL/min — ABNORMAL LOW (ref >60)
Glucose, Bld: 136 mg/dL — ABNORMAL HIGH (ref 65–99)
Potassium: 3.8 mmol/L (ref 3.5–5.1)
Sodium: 139 mmol/L (ref 135–145)

## 2016-07-11 SURGERY — NEPHROLITHOTOMY PERCUTANEOUS
Anesthesia: General | Site: Flank | Laterality: Left

## 2016-07-11 MED ORDER — HYDRALAZINE HCL 20 MG/ML IJ SOLN
INTRAMUSCULAR | Status: DC | PRN
  Administered 2016-07-11: 5 mg via INTRAVENOUS

## 2016-07-11 MED ORDER — SODIUM CHLORIDE 0.45 % IV SOLN
INTRAVENOUS | Status: DC
  Administered 2016-07-11 – 2016-07-12 (×2): via INTRAVENOUS
  Filled 2016-07-11 (×4): qty 1000

## 2016-07-11 MED ORDER — HYDROMORPHONE HCL 1 MG/ML IJ SOLN
0.5000 mg | INTRAMUSCULAR | Status: DC | PRN
  Administered 2016-07-11 (×4): 0.5 mg via INTRAVENOUS
  Filled 2016-07-11 (×4): qty 0.5

## 2016-07-11 MED ORDER — POTASSIUM CHLORIDE IN NACL 20-0.45 MEQ/L-% IV SOLN
INTRAVENOUS | Status: DC
  Administered 2016-07-11: 14:00:00 via INTRAVENOUS
  Filled 2016-07-11 (×2): qty 1000

## 2016-07-11 MED ORDER — HYDROMORPHONE HCL 1 MG/ML IJ SOLN
INTRAMUSCULAR | Status: AC
  Administered 2016-07-11: 0.5 mg via INTRAVENOUS
  Filled 2016-07-11: qty 1

## 2016-07-11 MED ORDER — FENTANYL CITRATE (PF) 100 MCG/2ML IJ SOLN
INTRAMUSCULAR | Status: AC
  Administered 2016-07-11: 50 ug via INTRAVENOUS
  Filled 2016-07-11: qty 2

## 2016-07-11 MED ORDER — PHENYLEPHRINE 40 MCG/ML (10ML) SYRINGE FOR IV PUSH (FOR BLOOD PRESSURE SUPPORT)
PREFILLED_SYRINGE | INTRAVENOUS | Status: AC
  Filled 2016-07-11: qty 10

## 2016-07-11 MED ORDER — LIDOCAINE-EPINEPHRINE (PF) 1 %-1:200000 IJ SOLN
INTRAMUSCULAR | Status: AC
  Filled 2016-07-11: qty 30

## 2016-07-11 MED ORDER — SUGAMMADEX SODIUM 200 MG/2ML IV SOLN
INTRAVENOUS | Status: AC
  Filled 2016-07-11: qty 2

## 2016-07-11 MED ORDER — ROCURONIUM BROMIDE 50 MG/5ML IV SOSY
PREFILLED_SYRINGE | INTRAVENOUS | Status: AC
  Filled 2016-07-11: qty 5

## 2016-07-11 MED ORDER — IOHEXOL 300 MG/ML  SOLN
INTRAMUSCULAR | Status: DC | PRN
  Administered 2016-07-11: 100 mL

## 2016-07-11 MED ORDER — PROPOFOL 10 MG/ML IV BOLUS
INTRAVENOUS | Status: AC
  Filled 2016-07-11: qty 20

## 2016-07-11 MED ORDER — ACETAMINOPHEN 10 MG/ML IV SOLN
1000.0000 mg | Freq: Four times a day (QID) | INTRAVENOUS | Status: AC
  Administered 2016-07-11 – 2016-07-12 (×4): 1000 mg via INTRAVENOUS
  Filled 2016-07-11 (×3): qty 100

## 2016-07-11 MED ORDER — HYDRALAZINE HCL 20 MG/ML IJ SOLN
INTRAMUSCULAR | Status: AC
  Filled 2016-07-11: qty 1

## 2016-07-11 MED ORDER — MIDAZOLAM HCL 2 MG/2ML IJ SOLN
INTRAMUSCULAR | Status: AC
  Filled 2016-07-11: qty 2

## 2016-07-11 MED ORDER — BUPIVACAINE HCL (PF) 0.5 % IJ SOLN
INTRAMUSCULAR | Status: AC
  Filled 2016-07-11: qty 30

## 2016-07-11 MED ORDER — MIDAZOLAM HCL 5 MG/5ML IJ SOLN
INTRAMUSCULAR | Status: DC | PRN
  Administered 2016-07-11: 2 mg via INTRAVENOUS

## 2016-07-11 MED ORDER — FENTANYL CITRATE (PF) 250 MCG/5ML IJ SOLN
INTRAMUSCULAR | Status: AC
  Filled 2016-07-11: qty 5

## 2016-07-11 MED ORDER — ROCURONIUM BROMIDE 100 MG/10ML IV SOLN
INTRAVENOUS | Status: DC | PRN
  Administered 2016-07-11: 10 mg via INTRAVENOUS
  Administered 2016-07-11: 50 mg via INTRAVENOUS
  Administered 2016-07-11 (×2): 10 mg via INTRAVENOUS

## 2016-07-11 MED ORDER — SENNOSIDES-DOCUSATE SODIUM 8.6-50 MG PO TABS: 1.0000 | ORAL_TABLET | Freq: Every evening | ORAL | Status: DC | PRN

## 2016-07-11 MED ORDER — LIDOCAINE 2% (20 MG/ML) 5 ML SYRINGE
INTRAMUSCULAR | Status: AC
  Filled 2016-07-11: qty 5

## 2016-07-11 MED ORDER — SUGAMMADEX SODIUM 200 MG/2ML IV SOLN
INTRAVENOUS | Status: DC | PRN
  Administered 2016-07-11: 200 mg via INTRAVENOUS

## 2016-07-11 MED ORDER — OXYCODONE HCL 5 MG PO TABS: 5.0000 mg | ORAL_TABLET | Freq: Once | ORAL | Status: DC | PRN

## 2016-07-11 MED ORDER — CIPROFLOXACIN IN D5W 400 MG/200ML IV SOLN
INTRAVENOUS | Status: AC
  Filled 2016-07-11: qty 200

## 2016-07-11 MED ORDER — ACETAMINOPHEN 10 MG/ML IV SOLN
INTRAVENOUS | Status: AC
  Filled 2016-07-11: qty 100

## 2016-07-11 MED ORDER — PHENYLEPHRINE HCL 10 MG/ML IJ SOLN
INTRAMUSCULAR | Status: DC | PRN
  Administered 2016-07-11 (×3): 40 ug via INTRAVENOUS

## 2016-07-11 MED ORDER — TRAMADOL HCL 50 MG PO TABS
50.0000 mg | ORAL_TABLET | Freq: Two times a day (BID) | ORAL | Status: DC | PRN
  Administered 2016-07-11: 50 mg via ORAL
  Filled 2016-07-11: qty 1

## 2016-07-11 MED ORDER — CLINDAMYCIN PHOSPHATE 900 MG/50ML IV SOLN
INTRAVENOUS | Status: AC
  Filled 2016-07-11: qty 50

## 2016-07-11 MED ORDER — OXYCODONE HCL 5 MG/5ML PO SOLN
5.0000 mg | Freq: Once | ORAL | Status: DC | PRN
  Filled 2016-07-11: qty 5

## 2016-07-11 MED ORDER — 0.9 % SODIUM CHLORIDE (POUR BTL) OPTIME
TOPICAL | Status: DC | PRN
  Administered 2016-07-11: 1000 mL

## 2016-07-11 MED ORDER — FENTANYL CITRATE (PF) 100 MCG/2ML IJ SOLN
INTRAMUSCULAR | Status: DC | PRN
  Administered 2016-07-11: 100 ug via INTRAVENOUS
  Administered 2016-07-11 (×3): 50 ug via INTRAVENOUS

## 2016-07-11 MED ORDER — ONDANSETRON HCL 4 MG/2ML IJ SOLN
INTRAMUSCULAR | Status: AC
  Filled 2016-07-11: qty 2

## 2016-07-11 MED ORDER — LIDOCAINE-EPINEPHRINE (PF) 1 %-1:200000 IJ SOLN
INTRAMUSCULAR | Status: DC | PRN
  Administered 2016-07-11: 20 mL

## 2016-07-11 MED ORDER — FENTANYL CITRATE (PF) 100 MCG/2ML IJ SOLN
25.0000 ug | INTRAMUSCULAR | Status: DC | PRN
  Administered 2016-07-11 (×3): 50 ug via INTRAVENOUS

## 2016-07-11 MED ORDER — HYDROMORPHONE HCL 1 MG/ML IJ SOLN
0.2500 mg | INTRAMUSCULAR | Status: DC | PRN
  Administered 2016-07-11 (×4): 0.5 mg via INTRAVENOUS

## 2016-07-11 MED ORDER — LIDOCAINE HCL (CARDIAC) 20 MG/ML IV SOLN
INTRAVENOUS | Status: DC | PRN
  Administered 2016-07-11: 100 mg via INTRAVENOUS

## 2016-07-11 MED ORDER — PROPOFOL 10 MG/ML IV BOLUS
INTRAVENOUS | Status: DC | PRN
  Administered 2016-07-11: 150 mg via INTRAVENOUS

## 2016-07-11 MED ORDER — CIPROFLOXACIN HCL 500 MG PO TABS
500.0000 mg | ORAL_TABLET | Freq: Two times a day (BID) | ORAL | Status: DC
  Administered 2016-07-11 – 2016-07-12 (×2): 500 mg via ORAL
  Filled 2016-07-11 (×3): qty 1

## 2016-07-11 MED ORDER — CIPROFLOXACIN IN D5W 400 MG/200ML IV SOLN
400.0000 mg | INTRAVENOUS | Status: AC
  Administered 2016-07-11: 400 mg via INTRAVENOUS

## 2016-07-11 MED ORDER — SODIUM CHLORIDE 0.9 % IR SOLN
Status: DC | PRN
  Administered 2016-07-11: 18000 mL

## 2016-07-11 MED ORDER — ONDANSETRON HCL 4 MG/2ML IJ SOLN
INTRAMUSCULAR | Status: DC | PRN
  Administered 2016-07-11: 4 mg via INTRAVENOUS

## 2016-07-11 MED ORDER — DEXAMETHASONE SODIUM PHOSPHATE 10 MG/ML IJ SOLN
INTRAMUSCULAR | Status: AC
  Filled 2016-07-11: qty 1

## 2016-07-11 MED ORDER — CLINDAMYCIN PHOSPHATE 900 MG/50ML IV SOLN
900.0000 mg | INTRAVENOUS | Status: AC
  Administered 2016-07-11: 900 mg via INTRAVENOUS

## 2016-07-11 MED ORDER — LACTATED RINGERS IV SOLN
INTRAVENOUS | Status: DC | PRN
  Administered 2016-07-11 (×2): via INTRAVENOUS

## 2016-07-11 SURGICAL SUPPLY — 51 items
BAG URINE DRAINAGE (UROLOGICAL SUPPLIES) ×4 IMPLANT
BASKET STNLS GEMINI 4WIRE 3FR (BASKET) IMPLANT
BASKET STONE NCOMPASS (UROLOGICAL SUPPLIES) IMPLANT
BASKET ZERO TIP NITINOL 2.4FR (BASKET) ×2 IMPLANT
BENZOIN TINCTURE PRP APPL 2/3 (GAUZE/BANDAGES/DRESSINGS) ×2 IMPLANT
BLADE SURG 15 STRL LF DISP TIS (BLADE) ×1 IMPLANT
BLADE SURG 15 STRL SS (BLADE) ×1
CATH AINSWORTH 30CC 24FR (CATHETERS) ×2 IMPLANT
CATH FOLEY 2WAY SLVR  5CC 16FR (CATHETERS) ×1
CATH FOLEY 2WAY SLVR 5CC 16FR (CATHETERS) ×1 IMPLANT
CATH IMAGER II 65CM (CATHETERS) ×2 IMPLANT
CATH URET 5FR 28IN OPEN ENDED (CATHETERS) ×2 IMPLANT
CATH URET DUAL LUMEN 6-10FR 50 (CATHETERS) ×2 IMPLANT
CATH X-FORCE N30 NEPHROSTOMY (TUBING) ×2 IMPLANT
CHLORAPREP W/TINT 26ML (MISCELLANEOUS) ×2 IMPLANT
COVER SURGICAL LIGHT HANDLE (MISCELLANEOUS) ×2 IMPLANT
DRAPE C-ARM 42X120 X-RAY (DRAPES) ×2 IMPLANT
DRAPE LINGEMAN PERC (DRAPES) ×2 IMPLANT
DRSG PAD ABDOMINAL 8X10 ST (GAUZE/BANDAGES/DRESSINGS) ×4 IMPLANT
DRSG TEGADERM 4X4.75 (GAUZE/BANDAGES/DRESSINGS) ×2 IMPLANT
DRSG TEGADERM 8X12 (GAUZE/BANDAGES/DRESSINGS) ×4 IMPLANT
FIBER LASER FLEXIVA 1000 (UROLOGICAL SUPPLIES) IMPLANT
FIBER LASER FLEXIVA 365 (UROLOGICAL SUPPLIES) IMPLANT
FIBER LASER FLEXIVA 550 (UROLOGICAL SUPPLIES) IMPLANT
FIBER LASER TRAC TIP (UROLOGICAL SUPPLIES) IMPLANT
GAUZE SPONGE 4X4 12PLY STRL (GAUZE/BANDAGES/DRESSINGS) ×2 IMPLANT
GLOVE BIOGEL M STRL SZ7.5 (GLOVE) ×2 IMPLANT
GOWN STRL REUS W/TWL XL LVL3 (GOWN DISPOSABLE) ×2 IMPLANT
GUIDEWIRE AMPLAZ .035X145 (WIRE) ×4 IMPLANT
GUIDEWIRE ANG ZIPWIRE 038X150 (WIRE) ×2 IMPLANT
GUIDEWIRE STR DUAL SENSOR (WIRE) ×2 IMPLANT
INSERT SILICONE FOR G14464 (MISCELLANEOUS) ×2 IMPLANT
IV SET EXTENSION CATH 6 NF (IV SETS) ×2 IMPLANT
KIT BASIN OR (CUSTOM PROCEDURE TRAY) ×2 IMPLANT
MANIFOLD NEPTUNE II (INSTRUMENTS) ×2 IMPLANT
NEEDLE TROCAR 18X15 ECHO (NEEDLE) ×2 IMPLANT
NEEDLE TROCAR 18X20 (NEEDLE) ×2 IMPLANT
NS IRRIG 1000ML POUR BTL (IV SOLUTION) ×2 IMPLANT
PACK CYSTO (CUSTOM PROCEDURE TRAY) ×2 IMPLANT
PROBE LITHOCLAST ULTRA 3.8X403 (UROLOGICAL SUPPLIES) ×2 IMPLANT
PROBE PNEUMATIC 1.0MMX570MM (UROLOGICAL SUPPLIES) ×2 IMPLANT
SHEATH PEELAWAY SET 9 (SHEATH) ×2 IMPLANT
SPONGE LAP 4X18 X RAY DECT (DISPOSABLE) ×2 IMPLANT
STENT CONTOUR 6FRX28X.038 (STENTS) ×2 IMPLANT
STONE CATCHER W/TUBE ADAPTER (UROLOGICAL SUPPLIES) ×2 IMPLANT
SUT ETHILON 2 0 PS N (SUTURE) ×2 IMPLANT
SYR 10ML LL (SYRINGE) ×2 IMPLANT
SYR 20CC LL (SYRINGE) ×4 IMPLANT
SYR 50ML LL SCALE MARK (SYRINGE) ×2 IMPLANT
TOWEL OR 17X26 10 PK STRL BLUE (TOWEL DISPOSABLE) ×2 IMPLANT
TUBING CONNECTING 10 (TUBING) ×4 IMPLANT

## 2016-07-11 NOTE — Anesthesia Procedure Notes (Signed)
Procedure Name: Intubation Date/Time: 07/11/2016 7:48 AM Performed by: Glory Buff Pre-anesthesia Checklist: Patient identified, Emergency Drugs available, Suction available and Patient being monitored Patient Re-evaluated:Patient Re-evaluated prior to inductionOxygen Delivery Method: Circle system utilized Preoxygenation: Pre-oxygenation with 100% oxygen Intubation Type: IV induction Ventilation: Mask ventilation without difficulty Laryngoscope Size: Miller and 3 Grade View: Grade I Tube type: Oral Tube size: 7.5 mm Number of attempts: 1 Airway Equipment and Method: Stylet and Oral airway Placement Confirmation: ETT inserted through vocal cords under direct vision,  positive ETCO2 and breath sounds checked- equal and bilateral Secured at: 21 cm Tube secured with: Tape Dental Injury: Teeth and Oropharynx as per pre-operative assessment

## 2016-07-11 NOTE — Discharge Instructions (Signed)
Discharge instructions following PCNL  Call your doctor for: Fevers greater than 100.5 Severe nausea or vomiting Increasing pain not controlled by pain medication Increasing redness or drainage from incisions Decreased urine output or a catheter is no longer draining  The number for questions is [phone].  Activity: Gradually increase activity with short frequent walks, 3-4 times a day.  Avoid strenuous activities, like sports, lawn-mowing, or heavy lifting (more than 10-15 pounds).  Wear loose, comfortable clothing that pull or kink the tube or tubes.  Do not drive while taking pain medication, or until your doctor permitts it.  Bathing and dressing changes: You should not shower for 48 hours after surgery.  Do not soak your back in a bathtub.  Diet: It is extremely important to drink plenty of fluids after surgery, especially water.  You may resume your regular diet, unless otherwise instructed.  Medications: May take Tylenol (acetaminophen) or ibuprofen (Advil, Motrin) as directed over-the-counter. Take any prescriptions as directed.  Follow-up appointments: Follow-up appointment will be scheduled with Dr. Trinaty Bundrick in 10-14 days for hospital check and stent removal.  

## 2016-07-11 NOTE — Anesthesia Postprocedure Evaluation (Addendum)
Anesthesia Post Note  Patient: Anthony Pierce  Procedure(s) Performed: Procedure(s) (LRB): NEPHROLITHOTOMY PERCUTANEOUS WITH SURGEON ACESS (Left)  Patient location during evaluation: PACU Anesthesia Type: General Level of consciousness: awake and alert Pain management: pain level controlled Vital Signs Assessment: post-procedure vital signs reviewed and stable Respiratory status: spontaneous breathing, nonlabored ventilation, respiratory function stable and patient connected to nasal cannula oxygen Cardiovascular status: blood pressure returned to baseline and stable Postop Assessment: no signs of nausea or vomiting Anesthetic complications: no       Last Vitals:  Vitals:   07/11/16 1308 07/11/16 1317  BP: (!) 173/90 (!) 161/90  Pulse: 71   Resp: 16   Temp: 36.3 C     Last Pain:  Vitals:   07/11/16 1324  TempSrc:   PainSc: 8                  Sebasthian Stailey

## 2016-07-11 NOTE — H&P (Signed)
Non-obstructing stone follow-up  HPI: Anthony Pierce is a 66 year-old male established patient who is here today for interval evaluation of non-obstructing kidney stones.  The patient was last seen 05/15/16.   The patient has not passed any stones since they were last seen. He has had any flank pain since in the interval. The patient states the pain occurs constantly. The patient had 60mm L UPJ, 11mm Left mid-pole, 50mm left lower pole, several smaller non-obstructing stones in the right kidney.   The patient has developed intermittency. He denies dysuria. He does have hematuria. He has not had fever and chills.   He has not had kidney stone surgery.   The patient has not completed a 24 hour urine collection in the past.   The patient underwent No imaging prior to today's appointment.     ALLERGIES: Penicillin - unknown    MEDICATIONS: Percocet 5 mg-325 mg tablet 1 tablet PO Q 4 H PRN  Aspirin Ec 81 mg tablet, delayed release  Atorvastatin Calcium  Hydrocodone-Acetaminophen 5 mg-325 mg tablet 1 tablet PO Q 4 H PRN  Meloxicam 15 mg tablet 1 tablet PO Daily     GU PSH: None     PSH Notes: arotic repair (belly) 04/2016   NON-GU PSH: Heart Surgery (Unspecified)    GU PMH: Left lower quadrant pain (Acute) - 05/15/2016 Kidney Stone - 03/26/2016 Nocturia - 03/26/2016 Urinary Frequency - 03/26/2016    NON-GU PMH: Hypercholesterolemia Sleep Apnea    FAMILY HISTORY: 1 Daughter - Other 1 son - Other mother deceased at age 74 - Other    Notes: Mother died of a stroke   SOCIAL HISTORY: Marital Status: Unknown Current Smoking Status: Patient smokes. Has smoked since 03/03/1996. Smokes 1 pack per day.  Has never drank.  Drinks 2 caffeinated drinks per day.    REVIEW OF SYSTEMS:    GU Review Male:   Patient reports get up at night to urinate and stream starts and stops. Patient denies frequent urination, hard to postpone urination, burning/ pain with urination, leakage of urine,  trouble starting your stream, have to strain to urinate , erection problems, and penile pain.  Gastrointestinal (Upper):   Patient denies nausea, vomiting, and indigestion/ heartburn.  Gastrointestinal (Lower):   Patient reports constipation. Patient denies diarrhea.  Constitutional:   Patient denies fever, night sweats, weight loss, and fatigue.  Skin:   Patient denies skin rash/ lesion and itching.  Eyes:   Patient denies blurred vision and double vision.  Ears/ Nose/ Throat:   Patient denies sore throat and sinus problems.  Hematologic/Lymphatic:   Patient reports easy bruising. Patient denies swollen glands.  Cardiovascular:   Patient denies leg swelling and chest pains.  Respiratory:   Patient reports shortness of breath. Patient denies cough.  Endocrine:   Patient denies excessive thirst.  Musculoskeletal:   Patient reports back pain. Patient denies joint pain.  Neurological:   Patient denies headaches and dizziness.  Psychologic:   Patient denies depression and anxiety.   VITAL SIGNS:      06/04/2016 08:33 AM  Weight 230 lb / 104.33 kg  Height 74 in / 187.96 cm  BP 175/108 mmHg  Pulse 74 /min  BMI 29.5 kg/m   PAST DATA REVIEWED:  Source Of History:  Patient   PROCEDURES:          Urinalysis w/Scope Dipstick Dipstick Cont'd Micro  Color: Amber Bilirubin: Neg WBC/hpf: 10 - 20/hpf  Appearance: Clear Ketones: Trace RBC/hpf: >60/hpf  Specific  Gravity: 1.025 Blood: 3+ Bacteria: Rare (0-9/hpf)  pH: 6.0 Protein: 2+ Cystals: Amorph Urates  Glucose: Neg Urobilinogen: 1.0 Casts: Hyaline    Nitrites: Neg Trichomonas: Not Present    Leukocyte Esterase: Neg Mucous: Present      Epithelial Cells: 0 - 5/hpf      Yeast: NS (Not Seen)      Sperm: Not Present    ASSESSMENT:      ICD-10 Details  1 GU:   Kidney Stone - N20.0 Left, The patient has a large stone burden in the left kidney, the largest stone being a 15 mm UPJ stone. We discussed treatment options for the patient including  shockwave lithotripsy, ureteroscopy, andAs it relates to the surgery itself I discussed the fact that the patient would be prone, and explained the renal access component. We then discussed the stone removal process as well as a postprocedure stent placement. I discussed the risks of this operation which predominantly include bleeding and damage to the surrounding structures. I also described for him the possibility of difficulty obtaining access which may require an additional surgery. After going through surgery, potential complications, and expected outcome, the patient has agreed to proceed with the operation.Marland Kitchen Ultimately after discussion the patient opted for PCNL.     PLAN:            Medications New Meds: Oxycodone Hcl 5 mg tablet 1-2 tablet PO Q 6 H   #30  0 Refill(s)            Orders Labs Urine Culture and Sensitivity          Schedule Return Visit/Planned Activity: ASAP - Schedule Surgery          Document Letter(s):  Created for Patient: Clinical Summary    After going over the treatment options for their large stone burden I recommended that we proceed with percutaneous nephrolithotomy (PNCL). I then went over this surgery in significant detail. I discussed the fact that the patient would be prone, and explained the renal access component. We then discussed the stone removal process as well as a postprocedure stent placement. I discussed the risks of this operation which predominantly include bleeding and damage to the surrounding structures. I also described for him the possibility of difficulty obtaining access which may require an additional surgery. I explained expected complications such as pain, bleeding and need for additional procedures. We also discussed the potential to require a blood transfusion. After going through surgery, potential complications, and expected outcome, the patient has agreed to proceed with the operation.

## 2016-07-11 NOTE — Op Note (Signed)
Pre-operative diagnosis: left renal pelvis stones, >2.0cm Post-operative diagnosis: as above   Procedure performed: cystoscopy, left retrograde pyelogram with interpretation, left percutaneous renal access, left nephrolithotomy, left nephrostogram,  left ureteral stent placement    Surgeon: Dr. Ardis Hughs  Assistant: none  Anesthesia: General  Complications: None  Specimens: The majority of the stones were removed and will be sent to the [facility] for further analysis.  Findings: 1. Large UPJ stone, two smaller stones also recovered. 2. Larger mid-pole stone on the CT was parenchymal and not removable through the collecting system (aka - Randall's Plaque).  3. Retrograde pyelogram demonstrated a normal caliber ureter with a large UPJ filling defect, there was not hydronephrosis and the calyces were sharp.  EBL: Approximately 100 cc  Specimens: stone from collecting system - taken to [facility]  Indication: Anthony Pierce is a 66 y.o. patient with a history of nephrolithiasis with a large stone burden in the left kidney. After reviewing the management options for treatment, he elected to proceed with the above surgical procedure(s). We have discussed the potential benefits and risks of the procedure, side effects of the proposed treatment, the likelihood of the patient achieving the goals of the procedure, and any potential problems that might occur during the procedure or recuperation. Informed consent has been obtained.  Description:  Consent was obtained in the preoperative holding area. The patient was marked appropriately and then taken back to the operating room where he was intubated on the gurney. The patient was flipped prone onto the split leg OR table. Large jelly rolls were placed in the anterior axillary line on both sides allowing the patient's chest and abdomen to fall inbetween. The patient was then prepped and draped in the routine  sterile fashion in the left flank and genital area. A timeout was then held confirming the proper side and procedure as well as antibiotics were administered.  I then used the flexible cystoscope and passed gently into the patient's urethra under visual guidance. Once into the bladder I advanced a 0.038 sensor wire into the left ureteral orfice and passed it up into the left collecting system. I then removed the stent over the wire and exchanged for a 5 [place] open-ended ureteral Pollock catheter. The Pollack catheter was then advanced up to the UPJ. The wire was then removed and a retrograde pyelogram was performed with the above findings. I then turned my attention to the patient's left flank and obtaining percutaneous renal access.  I then used the flexible cystoscope and passed gently into the patient's urethra under visual guidance. Once into the bladder I grasped the stent emanating from the patient's left ureteral orifice and pull it to the urethral meatus. I then passed a wire through the stent and up into the left collecting system. I then removed the stent over the wire and exchanged for a 5 [place] open-ended ureteral Pollock catheter. The Pollack catheter was then advanced up to the UPJ. The wire was then removed and a retrograde pyelogram was performed with the above findings. I then turned my attention to the patient's left flank and obtaining percutaneous renal access.  Using the C-arm rotated at 25 and the bulls-eye technique with an 18-gauge coaxial needle the lower posterior lateral calyx was targeted. Then rotating the C-arm AP depth of our needle was noted to be within the calyx and the inner part of the coaxial needle was removed. Urine was noted to return. A 0.038 sensor wire  was then passed through the sheath of the coaxial needle and into the left renal collecting system. The wire was then passed down the ureter and into the bladder using fluoroscopic guide and the sheath of the needle  was removed.  An angiographic catheter was then advanced into the bladder and the wire removed.  A Super Stiff wire was then passed into the angiographic catheter and the angiographic catheter removed. A 9 French peel-away dilator was then advanced over the Super Stiff wire and passed into the renal pelvis and across the UPJ under fluoroscopic guidance.  The inner part of this dilator was removed and the 0.38 sensor wire was passed alongside the Super Stiff wire through the dilator and into the left ureter and down into the bladder. The angiographic catheter again was passed over the guidewire and advanced into the bladder, the wire was then removed. A Super Stiff wire was then passed through angiographic catheter and angiographic catheter removed. The outer part of the sheath was then removed, establishing 2 superstiff wires through the targeted calyx and into the bladder.   The 89 French NephroMax balloon was then passed over one of the Super Stiff wires and the tip guided down into the targeted calyx. The balloon was then inflated to approximately 12 atm, and once there was no waist noted under fluoroscopy the access sheath was advanced over the balloon. The balloon was then removed. The wires were then placed back into the sheaths and snapped to the drape.   Using the rigid nephroscope to explore the targeted calyx and kidney.  The large UPJ stone was able to be removed without fragmentation with the two pronged grasper. Then using a flexible cystoscope to navigate the remaining calyces of the kidney multiple smaller stone fragments encountered.  Using the 0 tip basket these fragments were grabbed and removed.   Contrast was injected through the cystoscope and the calyces systematically inspected under fluoroscopic guidance to ensure that all stone fragments had been removed.  I struggled to get into the mid-pole calyx that was narrowed and at a difficult angle.  However, I was able to find and access the  calyx which demonstrated no stone, although a randall's plaque was noted.  Then using the flexible ureteroscope the ureter was navigated in antegrade fashion. All stone fragments were pushed from the ureter into the bladder. Once the ureter was clear the scope was advanced into the bladder and a 0.038 sensor wire was left in the bladder and the scope backed out over wire. The sensor wire was then backloaded over the rigid nephroscope using the stent pusher and a  28 cm x 6 French double-J ureteral stent was passed antegrade over the sensor wire down into the bladder under fluoroscopic guidance. Once the stent was in the bladder the wire was gently pulled back and a nice curl noted in the bladder. The wire completely removed from the stent, and nice curl on the proximal end of the stent was noted in the renal pelvis. The sheath was then backed out slowly to ensure that all calyces had been inspected and there was nothing behind the sheath.   A 66F ainsworth tip catheter was then passed over one of the Super Stiff wires through the sheath and into the renal pelvis. The sheath was then backed out of the kidney and cut over the red rubber catheter. A nephrostogram was then performed confirming the position of our nephrostomy tube and reassuring that there were no longer any  filling defects from the patient's symptoms.  After several minutes of direct pressure and observation was noted that there was no significant bleeding from the nephrostomy tube or around the nephrostomy tube tract. As such, I remove the nephrostomy tube as well as the safety wire. 25 cc of local anesthesia was then injected into the patient's wound, and the wound was closed with 3-0 nylon in 2 vertical mattress sutures. The incision was then padded using a bundle of 4 x 4's and Hypafix tape. Patient was subsequently rolled over to the supine position and extubated. The patient was returned to the PACU in excellent condition. At the end of the  case all lap and needle and sponges were accounted for. There are no perioperative complications.

## 2016-07-11 NOTE — Anesthesia Preprocedure Evaluation (Addendum)
Anesthesia Evaluation  Patient identified by MRN, date of birth, ID band Patient awake    Reviewed: Allergy & Precautions, NPO status , Patient's Chart, lab work & pertinent test results  History of Anesthesia Complications Negative for: history of anesthetic complications  Airway Mallampati: II  TM Distance: <3 FB Neck ROM: Full    Dental  (+) Teeth Intact   Pulmonary former smoker,    breath sounds clear to auscultation       Cardiovascular + Peripheral Vascular Disease   Rhythm:Regular     Neuro/Psych negative neurological ROS  negative psych ROS   GI/Hepatic negative GI ROS, Neg liver ROS,   Endo/Other  negative endocrine ROS  Renal/GU Renal InsufficiencyRenal disease     Musculoskeletal   Abdominal   Peds  Hematology negative hematology ROS (+)   Anesthesia Other Findings S/p evar  Reproductive/Obstetrics                             Anesthesia Physical Anesthesia Plan  ASA: II  Anesthesia Plan: General   Post-op Pain Management:    Induction: Intravenous  Airway Management Planned: Oral ETT  Additional Equipment: None  Intra-op Plan:   Post-operative Plan: Extubation in OR  Informed Consent: I have reviewed the patients History and Physical, chart, labs and discussed the procedure including the risks, benefits and alternatives for the proposed anesthesia with the patient or authorized representative who has indicated his/her understanding and acceptance.   Dental advisory given  Plan Discussed with: CRNA and Surgeon  Anesthesia Plan Comments:         Anesthesia Quick Evaluation

## 2016-07-11 NOTE — Transfer of Care (Signed)
Immediate Anesthesia Transfer of Care Note  Patient: Anthony Pierce  Procedure(s) Performed: Procedure(s): NEPHROLITHOTOMY PERCUTANEOUS WITH SURGEON ACESS (Left)  Patient Location: PACU  Anesthesia Type:General  Level of Consciousness: awake, alert  and oriented  Airway & Oxygen Therapy: Patient Spontanous Breathing and Patient connected to face mask oxygen  Post-op Assessment: Report given to RN and Post -op Vital signs reviewed and stable  Post vital signs: Reviewed and stable  Last Vitals:  Vitals:   07/11/16 0534  BP: (!) 173/109  Pulse: 66  Resp: 18  Temp: 36.5 C    Last Pain:  Vitals:   07/11/16 0534  TempSrc: Oral      Patients Stated Pain Goal: 4 ([id number])  Complications: No apparent anesthesia complications

## 2016-07-12 DIAGNOSIS — N202 Calculus of kidney with calculus of ureter: Secondary | ICD-10-CM | POA: Diagnosis not present

## 2016-07-12 LAB — CBC
HCT: 36.3 % — ABNORMAL LOW (ref 39.0–52.0)
Hemoglobin: 12.1 g/dL — ABNORMAL LOW (ref 13.0–17.0)
MCH: 29.7 pg (ref 26.0–34.0)
MCHC: 33.3 g/dL (ref 30.0–36.0)
MCV: 89.2 fL (ref 78.0–100.0)
PLATELETS: 219 10*3/uL (ref 150–400)
RBC: 4.07 MIL/uL — ABNORMAL LOW (ref 4.22–5.81)
RDW: 13.9 % (ref 11.5–15.5)
WBC: 15.7 10*3/uL — ABNORMAL HIGH (ref 4.0–10.5)

## 2016-07-12 LAB — BASIC METABOLIC PANEL
Anion gap: 4 — ABNORMAL LOW (ref 5–15)
BUN: 23 mg/dL — ABNORMAL HIGH (ref 6–20)
CALCIUM: 8 mg/dL — AB (ref 8.9–10.3)
CO2: 25 mmol/L (ref 22–32)
CREATININE: 1.45 mg/dL — AB (ref 0.61–1.24)
Chloride: 107 mmol/L (ref 101–111)
GFR calc Af Amer: 58 mL/min — ABNORMAL LOW (ref >60)
GFR, EST NON AFRICAN AMERICAN: 50 mL/min — AB (ref >60)
GLUCOSE: 129 mg/dL — AB (ref 65–99)
Potassium: 4.2 mmol/L (ref 3.5–5.1)
Sodium: 136 mmol/L (ref 135–145)

## 2016-07-12 MED ORDER — TRAMADOL HCL 50 MG PO TABS: 50.0000 mg | ORAL_TABLET | Freq: Four times a day (QID) | ORAL | 0 refills | Status: DC | PRN

## 2016-07-12 MED ORDER — SENNOSIDES-DOCUSATE SODIUM 8.6-50 MG PO TABS: 1.0000 | ORAL_TABLET | Freq: Every evening | ORAL | 99 refills | Status: DC | PRN

## 2016-07-12 MED ORDER — TRAMADOL HCL 50 MG PO TABS
50.0000 mg | ORAL_TABLET | Freq: Four times a day (QID) | ORAL | Status: DC | PRN
  Administered 2016-07-12: 100 mg via ORAL
  Filled 2016-07-12: qty 2

## 2016-07-12 NOTE — Discharge Summary (Signed)
Date of admission: 07/11/2016  Date of discharge: 07/12/2016  Admission diagnosis: left nephrolithiasis  Discharge diagnosis: same, s/p left PCNL  Secondary diagnoses:  Patient Active Problem List   Diagnosis Date Noted  . Nephrolithiasis 07/11/2016  . AAA (abdominal aortic aneurysm) ([place]) 03/27/2016    History and Physical: For full details, please see admission history and physical. Briefly, Anthony Pierce is a 66 y.o. year old patient with large left sided kidney stone burden.   Hospital Course: Patient tolerated the procedure well.  He was then transferred to the floor after an uneventful PACU stay.  His hospital course was uncomplicated.  On POD#1 he had met discharge criteria: was eating a regular diet, was up and ambulating independently,  pain was well controlled, was voiding without a catheter, and was ready to for discharge.  Vitals:   07/11/16 1749 07/11/16 2116 07/12/16 0222 07/12/16 0438  BP: (!) 167/88 (!) 157/91 (!) 146/86 (!) 148/85  Pulse: 77 76 75 73  Resp: _0 Temp: 97.9 F (36.6 C) 97.9 F (36.6 C) 98.6 F (37 C) 98.6 F (37 C)  TempSrc: Oral Oral Oral Oral  SpO2: 95% 95% 94% 97%  Weight:      Height:       NAD Incision on left flank is c/d/i Foley draining straw colored urine - removed  Laboratory values:   Recent Labs  07/11/16 1203 07/12/16 0451  WBC  --  15.7*  HGB 13.3 12.1*  HCT  --  36.3*    Recent Labs  07/11/16 1203 07/12/16 0451  NA 139 136  K 3.8 4.2  CL 107 107  CO2 25 25  GLUCOSE 136* 129*  BUN 21* 23*  CREATININE 1.38* 1.45*  CALCIUM 8.1* 8.0*   No results for input(s): LABPT, INR in the last 72 hours. No results for input(s): LABURIN in the last 72 hours. Results for orders placed or performed during the hospital encounter of 03/26/16  Surgical PCR screen     Status: None   Collection Time: 03/27/16  3:12 AM  Result Value Ref Range Status   MRSA, PCR NEGATIVE NEGATIVE Final   Staphylococcus aureus NEGATIVE  NEGATIVE Final    Comment:        The Xpert SA Assay (FDA approved for NASAL specimens in patients over 7 years of age), is one component of a comprehensive surveillance program.  Test performance has been validated by [facility] for patients greater than or equal to 59 year old. It is not intended to diagnose infection nor to guide or monitor treatment.   Urine culture     Status: None   Collection Time: 03/27/16  3:30 AM  Result Value Ref Range Status   Specimen Description URINE, RANDOM  Final   Special Requests ADDED [id number]  Final   Culture NO GROWTH  Final   Report Status 03/28/2016 FINAL  Final    Disposition: Home  Discharge instruction: The patient was instructed to be ambulatory but told to refrain from heavy lifting, strenuous activity, or driving.   Discharge medications:  Allergies as of 07/12/2016      Reactions   Penicillins Rash   Has patient had a PCN reaction causing immediate rash, facial/tongue/throat swelling, SOB or lightheadedness with hypotension: Yes Has patient had a PCN reaction causing severe rash involving mucus membranes or skin necrosis: No Has patient had a PCN reaction that required hospitalization No Has patient had a PCN reaction occurring within the last  10 years: No If all of the above answers are "NO", then may proceed with Cephalosporin use.      Medication List    STOP taking these medications   ciprofloxacin 500 MG tablet Commonly known as:  CIPRO   HYDROcodone-acetaminophen 5-325 MG tablet Commonly known as:  NORCO/VICODIN   metroNIDAZOLE 500 MG tablet Commonly known as:  FLAGYL     TAKE these medications   aspirin EC 81 MG tablet Take 81 mg by mouth daily.   atorvastatin 10 MG tablet Commonly known as:  LIPITOR Take 1 tablet (10 mg total) by mouth daily. What changed:  when to take this   ibuprofen 200 MG tablet Commonly known as:  ADVIL,MOTRIN Take 400 mg by mouth every 6 (six) hours as needed for mild  pain.   senna-docusate 8.6-50 MG tablet Commonly known as:  Senokot-S Take 1 tablet by mouth at bedtime as needed for mild constipation.   traMADol 50 MG tablet Commonly known as:  ULTRAM Take 1-2 tablets (50-100 mg total) by mouth every 6 (six) hours as needed for moderate pain. What changed:  how much to take  when to take this       Followup:  Follow-up Information    Ardis Hughs, MD On 07/26/2016.   Specialty:  Urology Why:  2:45pm Contact information: [address] [place] [address] [phone]

## 2016-07-12 NOTE — Progress Notes (Signed)
Nutrition Brief Note  Patient identified on the Malnutrition Screening Tool (MST) Report  Pt admitted for large left sided kidney stone burden. Now s/p cystoscopy, left retrograde pyelogram with interpretation, left percutaneous renal access, left nephrolithotomy, left nephrostogram,  left ureteral stent placement. Per MD note, "Patient tolerated the procedure well.  He was then transferred to the floor after an uneventful PACU stay.  His hospital course was uncomplicated.  On POD#1 he had met discharge criteria: was eating a regular diet, was up and ambulating independently,  pain was well controlled, was voiding without a catheter, and was ready to for discharge." Pt possibly discharging today. Per chart, pt has lost 12lbs(5%) in two months which is not significant.     Wt Readings from Last 15 Encounters:  07/11/16 218 lb (98.9 kg)  07/08/16 218 lb 9.6 oz (99.2 kg)  04/29/16 230 lb 8 oz (104.6 kg)  04/23/16 225 lb (102.1 kg)  03/27/16 220 lb 12.8 oz (100.2 kg)  03/25/16 227 lb (103 kg)    Body mass index is 27.99 kg/m. Patient meets criteria for overweight based on current BMI.   Current diet order is regular, patient is consuming approximately 100% of meals at this time. Labs and medications reviewed.   No nutrition interventions warranted at this time. If nutrition issues arise, please consult RD.   Koleen Distance, RD, LDN Pager #- [phone]

## 2016-11-04 NOTE — Addendum Note (Signed)
Addendum  created 11/04/16 1114 by Oleta Mouse, MD   Sign clinical note

## 2016-12-02 ENCOUNTER — Other Ambulatory Visit (HOSPITAL_COMMUNITY): Payer: BLUE CROSS/BLUE SHIELD

## 2016-12-02 ENCOUNTER — Ambulatory Visit: Payer: BLUE CROSS/BLUE SHIELD | Admitting: Surgery

## 2016-12-20 ENCOUNTER — Encounter: Payer: Self-pay | Admitting: Surgery

## 2016-12-30 ENCOUNTER — Encounter: Payer: Self-pay | Admitting: Surgery

## 2016-12-30 ENCOUNTER — Ambulatory Visit (HOSPITAL_COMMUNITY)
Admission: RE | Admit: 2016-12-30 | Discharge: 2016-12-30 | Disposition: A | Discharge status: Home / Self Care | Payer: BLUE CROSS/BLUE SHIELD | Source: Ambulatory Visit | Attending: Surgery | Admitting: Surgery

## 2016-12-30 ENCOUNTER — Ambulatory Visit (INDEPENDENT_AMBULATORY_CARE_PROVIDER_SITE_OTHER): Payer: BLUE CROSS/BLUE SHIELD | Admitting: Surgery

## 2016-12-30 VITALS — BP 173/103 | HR 60 | Temp 98.2°F | Resp 16 | Ht 74.0 in | Wt 223.0 lb

## 2016-12-30 DIAGNOSIS — I6529 Occlusion and stenosis of unspecified carotid artery: Secondary | ICD-10-CM

## 2016-12-30 DIAGNOSIS — I714 Abdominal aortic aneurysm, without rupture, unspecified: Secondary | ICD-10-CM

## 2016-12-30 NOTE — Progress Notes (Signed)
[facility] [place]  Patient name: Anthony Pierce MRN: [id number] DOB: 07/13/1950 Sex: male   REASON FOR VISIT:    Follow up  HISOTRY OF PRESENT ILLNESS:    Anthony Pierce is a 66 y.o. male who returns today for follow-up.  He is status post endovascular repair of an infrarenal abdominal aortic aneurysm on 03/27/2016.  Prior to his surgery, the patient presented complaining of abdominal pain in his left lower quadrant that radiated to his scrotum.  During his workup a CT scan was negative for diverticulitis but did reveal a left kidney stone.  Because of his persistent pain, the patient had his aneurysm repaired.  Postoperatively he continued to have pain but this resolved with continued antibiotics for possible urinary tract infection.  After his operation he presented to the emergency department on 04/23/2016 with abdominal pain and bright red blood per rectum.  He was diagnosed with diverticulosis.  Recently, he had extraction of a large kidney stone.  All of his symptoms resolved after this.   PAST MEDICAL HISTORY:   Past Medical History:  Diagnosis Date  . Diverticulosis   . Dysrhythmia    heart palpitations infrequent  . Hemorrhoids   . History of AAA (abdominal aortic aneurysm) repair   . History of kidney stones   . Kidney stones      FAMILY HISTORY:   No family history on file.  SOCIAL HISTORY:   Social History  Substance Use Topics  . Smoking status: Light Tobacco Smoker    Types: Cigarettes    Last attempt to quit: 06/24/2016  . Smokeless tobacco: Never Used     Comment: 1 pk per day  . Alcohol use No     ALLERGIES:   Allergies  Allergen Reactions  . Penicillins Rash    Has patient had a PCN reaction causing immediate rash, facial/tongue/throat swelling, SOB or lightheadedness with hypotension: Yes Has patient had a PCN reaction causing severe rash involving mucus membranes or skin necrosis:  No Has patient had a PCN reaction that required hospitalization No Has patient had a PCN reaction occurring within the last 10 years: No If all of the above answers are "NO", then may proceed with Cephalosporin use.      CURRENT MEDICATIONS:   Current Outpatient Prescriptions  Medication Sig Dispense Refill  . aspirin EC 81 MG tablet Take 81 mg by mouth daily.    Marland Kitchen ibuprofen (ADVIL,MOTRIN) 200 MG tablet Take 400 mg by mouth every 6 (six) hours as needed for mild pain.    Marland Kitchen atorvastatin (LIPITOR) 10 MG tablet Take 1 tablet (10 mg total) by mouth daily. (Patient not taking: Reported on 12/30/2016) 30 tablet 5  . senna-docusate (SENOKOT-S) 8.6-50 MG tablet Take 1 tablet by mouth at bedtime as needed for mild constipation. (Patient not taking: Reported on 12/30/2016) 60 tablet prn   No current facility-administered medications for this visit.     REVIEW OF SYSTEMS:   [X]  denotes positive finding, [ ]  denotes negative finding Cardiac  Comments:  Chest pain or chest pressure:    Shortness of breath upon exertion:    Short of breath when lying flat:    Irregular heart rhythm:        Vascular    Pain in calf, thigh, or hip brought on by ambulation:    Pain in feet at night that wakes you up from your sleep:     Blood clot in your veins:  Leg swelling:         Pulmonary    Oxygen at home:    Productive cough:     Wheezing:         Neurologic    Sudden weakness in arms or legs:     Sudden numbness in arms or legs:     Sudden onset of difficulty speaking or slurred speech:    Temporary loss of vision in one eye:     Problems with dizziness:         Gastrointestinal    Blood in stool:     Vomited blood:         Genitourinary    Burning when urinating:     Blood in urine:        Psychiatric    Major depression:         Hematologic    Bleeding problems:    Problems with blood clotting too easily:        Skin    Rashes or ulcers:        Constitutional    Fever or  chills:      PHYSICAL EXAM:   Vitals:   12/30/16 0928 12/30/16 0932  BP: (!) 172/103 (!) 173/103  Pulse: 60   Resp: 16   Temp: 98.2 F (36.8 C)   TempSrc: Oral   SpO2: 97%   Weight: 223 lb (101.2 kg)   Height: 6\' 2"  (1.88 m)     GENERAL: The patient is a well-nourished male, in no acute distress. The vital signs are documented above. CARDIAC: There is a regular rate and rhythm.  VASCULAR: Palpable pedal pulses.  No carotid bruits. PULMONARY: Non-labored respirations ABDOMEN: Soft and non-tender  MUSCULOSKELETAL: There are no major deformities or cyanosis. NEUROLOGIC: No focal weakness or paresthesias are detected. SKIN: There are no ulcers or rashes noted. PSYCHIATRIC: The patient has a normal affect.  STUDIES:   I have ordered and reviewed his ultrasound.  Maximum aortic diameter is 4.8 cm.  No evidence of endoleak  MEDICAL ISSUES:   Status post endovascular abdominal aortic aneurysm repair.  The patient is without complaints.  Ultrasound shows no evidence of endoleak and a decreasing size aneurysm sac.  The patient will come back in 1 year for repeat surveillance ultrasound.  He did not get his preoperative carotid duplex, and therefore I will get this when he returns in 1 year.    Annamarie Major, MD [facility] [phone] Pager [phone]

## 2017-01-07 NOTE — Addendum Note (Signed)
Addended by: Lianne Cure A on: 01/07/2017 04:28 PM   Modules accepted: Orders

## 2017-04-14 DIAGNOSIS — I10 Essential (primary) hypertension: Secondary | ICD-10-CM | POA: Insufficient documentation

## 2017-04-14 DIAGNOSIS — I5022 Chronic systolic (congestive) heart failure: Secondary | ICD-10-CM | POA: Insufficient documentation

## 2017-04-14 IMAGING — CT CT ABD-PELV W/ CM
2 of 5 series · 15 of 46 positions shown, 17 images · IV contrast (APPLIED)
Comparison: CT Abdomen and Pelvis 03/26/2016 and earlier.

CLINICAL DATA: 65-year-old male with left lower quadrant pain and
blood in stool. Treatment for abdominal aortic aneurysm in [REDACTED].
Initial encounter.

EXAM:
CT ABDOMEN AND PELVIS WITH CONTRAST
TECHNIQUE: Multidetector CT imaging of the abdomen and pelvis was performed
using the standard protocol following bolus administration of
intravenous contrast.
CONTRAST:  100mL FPS8C2-IWW IOPAMIDOL (FPS8C2-IWW) INJECTION 61%

[Series 2: abd/ pelvis 5.0 i30f 1 · axial · 0.94mm/px · z∈[+764,+1239]mm · 12 of 107 slices shown, 14 images]
[im 6/107  soft-tissue]
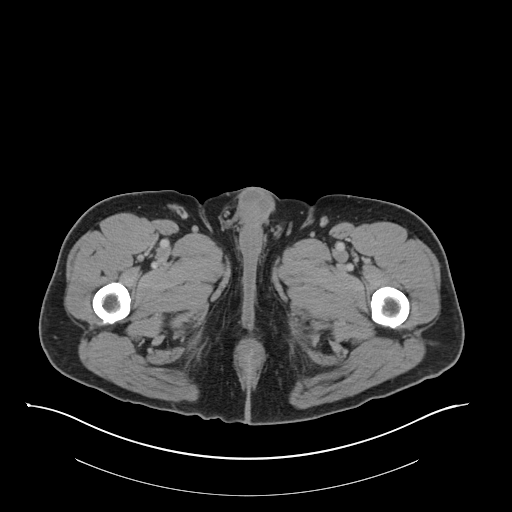
[im 6/107  bone]
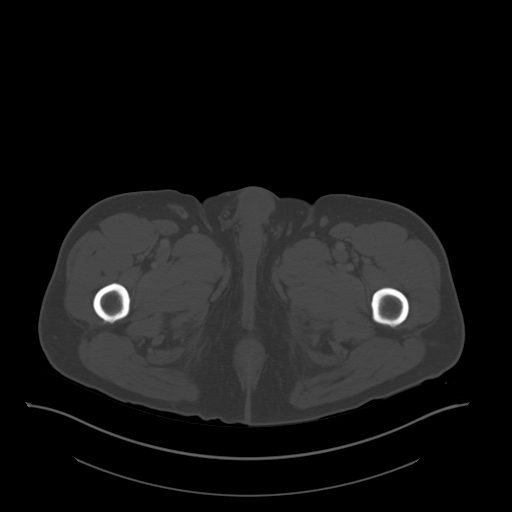
[im 17/107  soft-tissue]
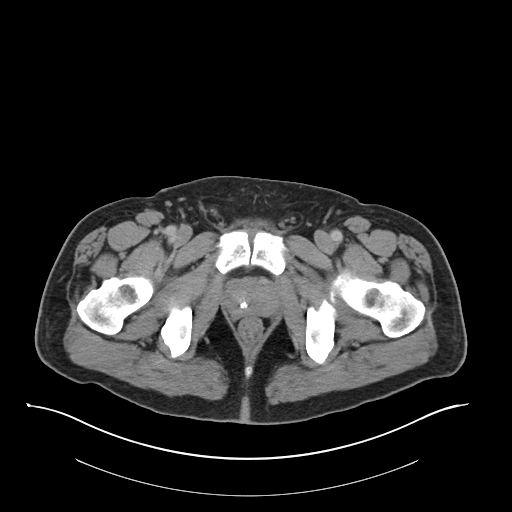
[im 23/107  soft-tissue]
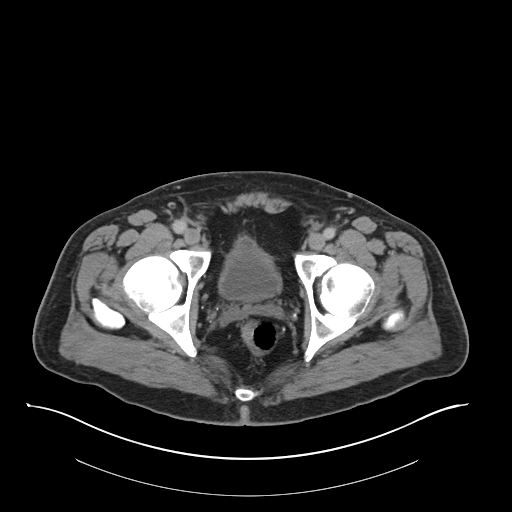
[im 34/107  soft-tissue]
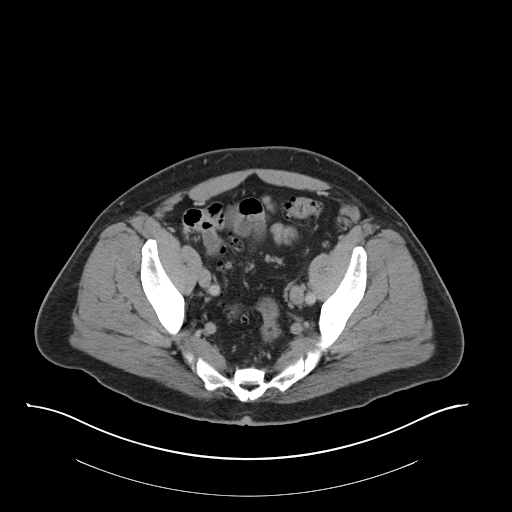
[im 40/107  soft-tissue]
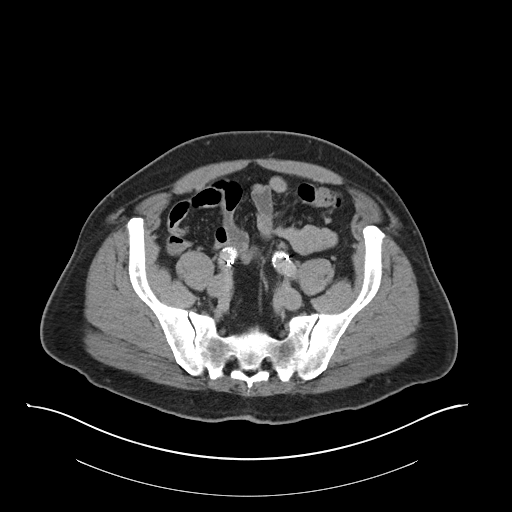
[im 51/107  soft-tissue]
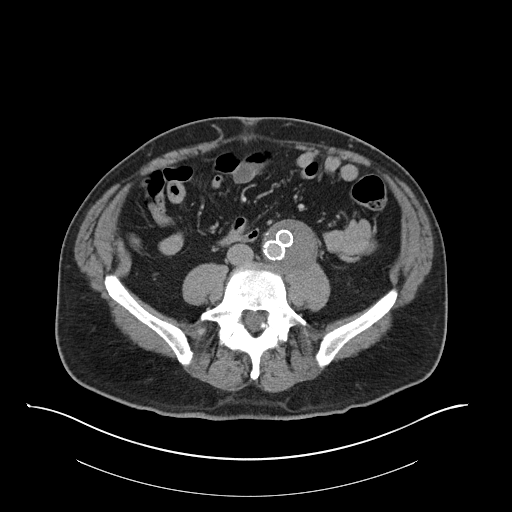
[im 56/107  soft-tissue]
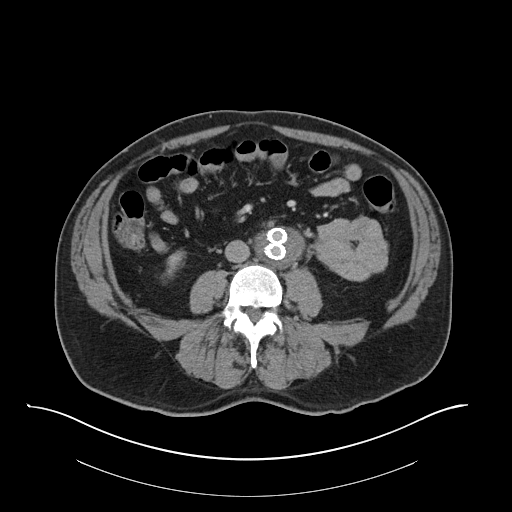
[im 67/107  soft-tissue]
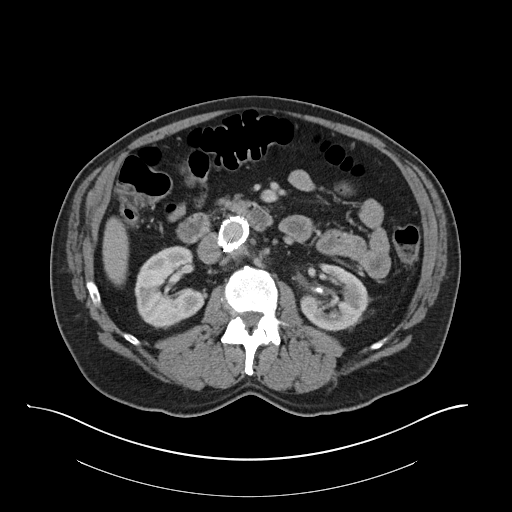
[im 73/107  soft-tissue]
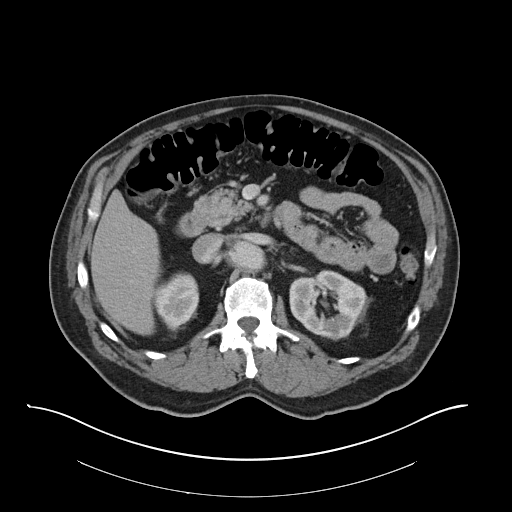
[im 73/107  bone]
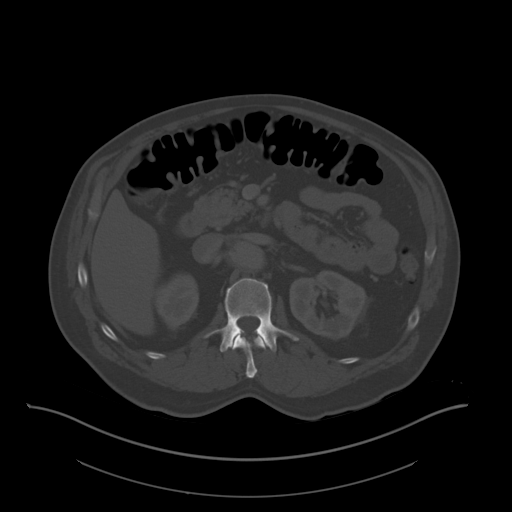
[im 84/107  soft-tissue]
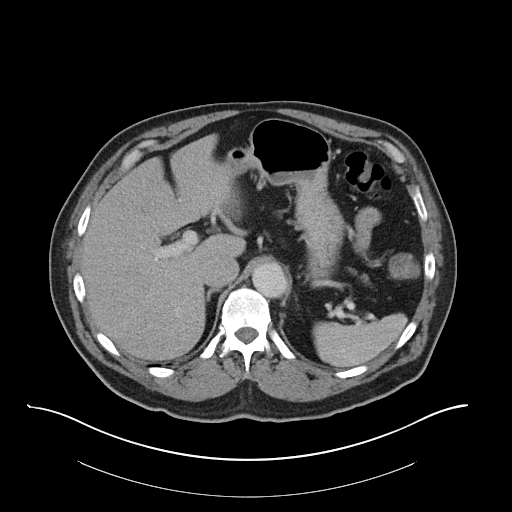
[im 90/107  soft-tissue]
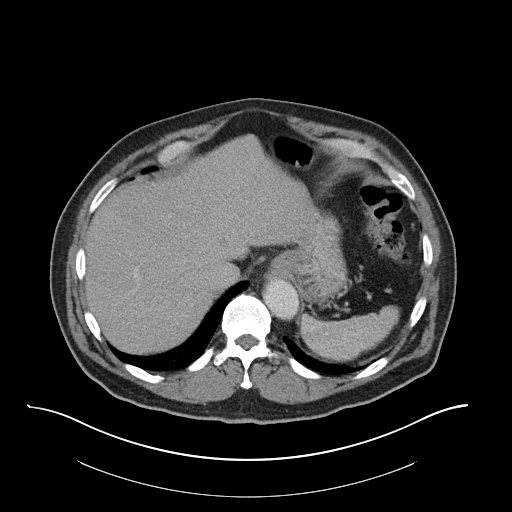
[im 101/107  soft-tissue]
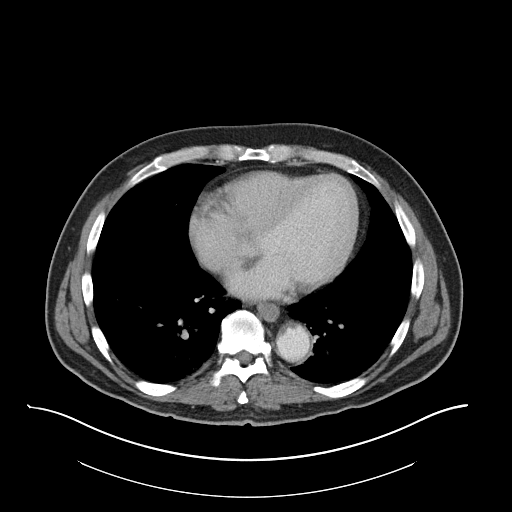

[Series 4: coronal soft tissue · coronal · 0.84mm/px · 3 of 107 slices shown]
[im 36/107  soft-tissue]
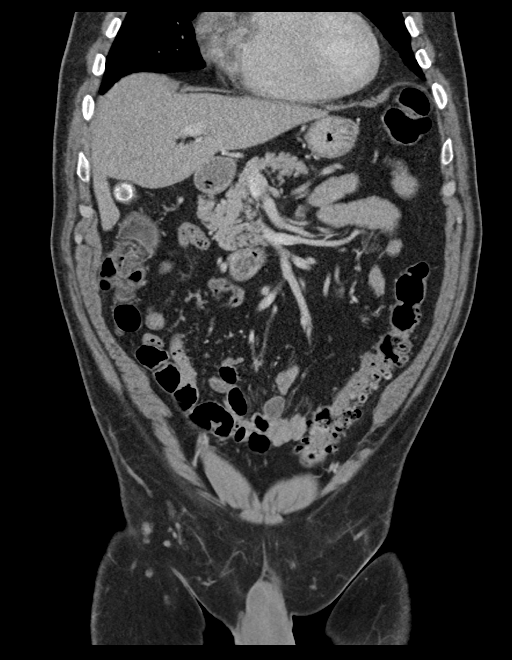
[im 48/107  soft-tissue]
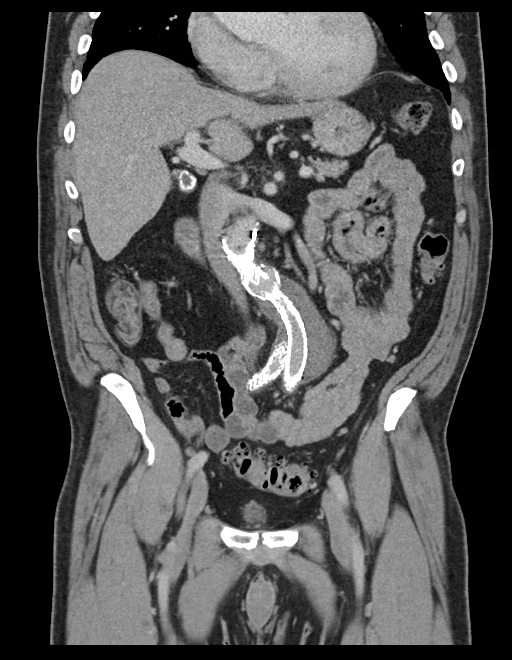
[im 59/107  soft-tissue]
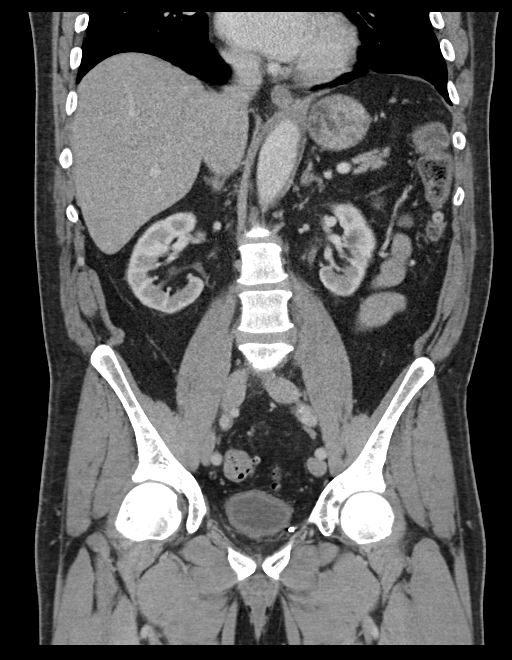

[15 of 46 positions shown; findings below may reference images not displayed]

FINDINGS: Lower chest: Mild dependent atelectasis in the lower lobes is new
from the prior. Mild cardiomegaly. No pericardial or pleural
effusion.

No upper abdominal free air.

Hepatobiliary: Extensive cholelithiasis. The gallbladder remains
contracted. No pericholecystic inflammation. Negative liver. No
biliary ductal enlargement.

Pancreas: Negative.

Spleen: Negative.

Adrenals/Urinary Tract: Negative adrenal glands. Extensive Bilateral
nephrolithiasis. 16 mm calculus remains in the left renal pelvis,
unchanged from 03/26/2016. Right renal lower pole simple cyst
incidentally re- demonstrated. Bilateral renal enhancement and
contrast excretion is symmetric and within normal limits, including
into the proximal left ureter. Diminutive, unremarkable urinary
bladder.

Stomach/Bowel: Negative rectum. Severe sigmoid diverticulosis which
continues into the descending colon nearly to the splenic flexure.
These colonic segments appear stable since [REDACTED], no active
inflammation identified. Mild diverticulosis in the transverse
colon. Negative right colon and appendix. Negative terminal ileum.
No dilated small bowel. Decompressed stomach. Negative duodenum.

Vascular/Lymphatic: Interval endovascular treatment of the 5.2 cm
infrarenal abdominal aortic aneurysm with bifurcated endograft. The
endograft is patent. The native aneurysm sac has not significantly
changed. Comparison of portal venous and delayed phase density
within the native sac suggests no enhancement/leak.

Major pelvic arterial structures appear patent. Portal venous system
is patent.

No lymphadenopathy.

Reproductive: Negative.

Other: No pelvic free fluid.

Musculoskeletal: Chronic L5 pars fractures and mild grade 1
anterolisthesis at the lumbosacral junction again noted. Stable
visualized osseous structures.
IMPRESSION: 1. Interval treatment of infrarenal abdominal aortic aneurysm with
bifurcated endograft. No adverse features. Stable native aneurysm
sac size. Routine CTA follow-up as per Vascular Surgery.
2. Severe diverticulosis of the descending and sigmoid colon, stable
in appearance since [REDACTED]. No diverticulitis or active
inflammation identified.
3. Extensive nephrolithiasis and cholelithiasis. Large 16 mm
calculus in the left renal pelvis is unchanged since 03/26/2016 and
is not obstructing at the time of this scan.

## 2017-04-15 DIAGNOSIS — I428 Other cardiomyopathies: Secondary | ICD-10-CM | POA: Insufficient documentation

## 2017-04-15 DIAGNOSIS — I447 Left bundle-branch block, unspecified: Secondary | ICD-10-CM | POA: Insufficient documentation

## 2017-05-08 ENCOUNTER — Ambulatory Visit (INDEPENDENT_AMBULATORY_CARE_PROVIDER_SITE_OTHER): Payer: BLUE CROSS/BLUE SHIELD | Admitting: Osteopathic Medicine

## 2017-05-08 ENCOUNTER — Encounter: Payer: Self-pay | Admitting: Osteopathic Medicine

## 2017-05-08 VITALS — BP 151/79 | HR 65 | Ht 76.0 in | Wt 209.0 lb

## 2017-05-08 DIAGNOSIS — I714 Abdominal aortic aneurysm, without rupture, unspecified: Secondary | ICD-10-CM

## 2017-05-08 DIAGNOSIS — I428 Other cardiomyopathies: Secondary | ICD-10-CM | POA: Diagnosis not present

## 2017-05-08 DIAGNOSIS — F418 Other specified anxiety disorders: Secondary | ICD-10-CM

## 2017-05-08 DIAGNOSIS — I5022 Chronic systolic (congestive) heart failure: Secondary | ICD-10-CM | POA: Diagnosis not present

## 2017-05-08 DIAGNOSIS — I447 Left bundle-branch block, unspecified: Secondary | ICD-10-CM | POA: Diagnosis not present

## 2017-05-08 DIAGNOSIS — I10 Essential (primary) hypertension: Secondary | ICD-10-CM | POA: Diagnosis not present

## 2017-05-08 MED ORDER — SACUBITRIL-VALSARTAN 24-26 MG PO TABS: 1.0000 | ORAL_TABLET | Freq: Two times a day (BID) | ORAL | 1 refills | Status: DC

## 2017-05-08 MED ORDER — ASPIRIN EC 81 MG PO TBEC: 81.0000 mg | DELAYED_RELEASE_TABLET | Freq: Every day | ORAL | 3 refills | Status: DC

## 2017-05-08 MED ORDER — ATORVASTATIN CALCIUM 40 MG PO TABS: 40.0000 mg | ORAL_TABLET | Freq: Every day | ORAL | 3 refills | Status: DC

## 2017-05-08 MED ORDER — CARVEDILOL 6.25 MG PO TABS: 6.2500 mg | ORAL_TABLET | Freq: Two times a day (BID) | ORAL | 1 refills | Status: DC

## 2017-05-08 MED ORDER — LORAZEPAM 0.5 MG PO TABS: 0.5000 mg | ORAL_TABLET | Freq: Two times a day (BID) | ORAL | 0 refills | Status: DC | PRN

## 2017-05-08 MED ORDER — SPIRONOLACTONE 25 MG PO TABS: 25.0000 mg | ORAL_TABLET | Freq: Every day | ORAL | 1 refills | Status: DC

## 2017-05-08 NOTE — Patient Instructions (Signed)
DR. Redgie Grayer IMPORTANT  INFORMATION FOR NEW PATIENTS!  PLEASE REVIEW CAREFULLY!   REGARDING ANY CARE YOU RECEIVE OUTSIDE OUR OFFICE  At any visits to any specialists, or if you receive vaccines anywhere outside our office, please provide our clinic information so that they can forward [place] any records, including any tests which are done, changes to your medications, or new vaccinations.   Also, if you are ever treated in an emergency room or if you are admitted to the hospital, please contact our office after your discharge. We encourage our patients to schedule a follow-up visit with their PCP any time they are treated for a serious illness or injury.   REGARDING PREVENTIVE CARE & WELLNESS, AKA "ANNUAL PHYSICAL"  Let's plan to follow-up here in the office next year for [place].    If you would like to, you can get routine lab work done 2 - 3 days before your physical so that we can go over the results in person at your appointment. Please call our office a week before that appointment so we can make sure the lab has the appropriate orders for your blood draw.   Please note: insurance should completely cover one preventive care visit annually, and they should completely cover most tests associated with preventive care such as routine labs, mammograms, etc. If you have any other medical concerns to address, you may be asked to reschedule your annual physical, or schedule a separate visit to address other medical concerns. Or, you may be billed for care related to "problem-based visit" in addition to your "preventive care visit." If you have questions about this, please contact your insurance company, or [facility] billing department.   REGARDING ANY FORMS NEEDING YOUR DOCTOR'S SIGNATURE  If you ever have any paperwork which needs to be completed by your PCP, you may be asked to come to the office if that paperwork requires a complex review of your medical  history - we want to make sure everything is completed to your satisfaction and is completed correctly the first time, particularly with FMLA or other employment/legal matters!  Please let us know if there is anything else we can do for you. Take care! -Dr. Loni Muse.

## 2017-05-08 NOTE — Progress Notes (Signed)
HPI: Anthony Pierce is a 66 y.o. male who  has a past medical history of Diverticulosis, Dysrhythmia, Hemorrhoids, History of AAA (abdominal aortic aneurysm) repair, History of kidney stones, and Kidney stones.  he presents to [facility] today, 05/08/17,  for chief complaint of: No chief complaint on file.  New patient here to establish care. Very pleasant gentleman who has gone through a bit of a rough time with recent diagnosis CHF, personal issues with divorce.  At this point, no complaints other than not sure he wants to be on so many medications! No chest pain, pressure, shortness of breath. No lower extremity swelling.  He initially went to the hospital for significant shortness of breath and was kept for CHF, new diagnosis. He is unclear about why this may have happened to him though he does have a smoking history and on record review looks like a long history of significantly elevated blood pressures which has not been addressed.  Hasn't taken BP medications this morning.   He reports significant anxiety issues, he is taking benzodiazepines maybe once or twice per day most days. Has never been on SSRI/SSRI therapy. He reports significant stress issues due to going through a divorce at this time. He believes that the emotional/psychological stress of this process is the reason that his heart failure presented  Cardiology notes reviewed: Most recent follow-up there was 04/22/2017. Hospitalized 04/14/2017 with cardiomyopathy, left bundle branch block. LVEF 25-30%. At follow-up, normotensive, no edema, no orthopnea, no syncope, no chest discomfort or breathing problems. On Entresto, aspirin 81, carvedilol 6.25 twice a day, Lasix 20 mg daily, spironolactone 25 mg daily, atorvastatin 40 mg daily. At that visit, they were checking metabolic panel, as long as renal function okay planning to schedule for coronary CT angiogram. Returning for follow-up in 3 weeks. He  has an appointment for 05/13/2017.  Notes from vascular surgery reviewed as well, last visit there 12/30/2016. Blood pressure at that visit was quite high, 172/103, rechecked at 173/103. Status post endovascular repair of infrarenal abdominal aortic aneurysm 03/27/2016. Patient is following annually with Dr.Brabham.   History of diverticulosis on ER visit 04/23/2016.  History of kidney stones, following with urology.    Past medical, surgical, social and family history reviewed:  Patient Active Problem List   Diagnosis Date Noted  . LBBB (left bundle branch block) 04/15/2017  . Nonischemic cardiomyopathy ([place]) 04/15/2017  . Chronic systolic congestive heart failure ([place]) 04/14/2017  . Essential hypertension 04/14/2017  . Nephrolithiasis 07/11/2016  . AAA (abdominal aortic aneurysm) ([place]) 03/27/2016  . Tobacco abuse 07/09/2012    Past Surgical History:  Procedure Laterality Date  . ABDOMINAL AORTIC ENDOVASCULAR STENT GRAFT Bilateral 03/27/2016   Procedure: ABDOMINAL AORTIC ENDOVASCULAR STENT GRAFT;  Surgeon: Serafina Mitchell, MD;  Location: [facility];  Service: Vascular;  Laterality: Bilateral;  . COLONSCOPY  AGE 13  . NEPHROLITHOTOMY Left 07/11/2016   Procedure: NEPHROLITHOTOMY PERCUTANEOUS WITH SURGEON ACESS;  Surgeon: Ardis Hughs, MD;  Location: [facility];  Service: Urology;  Laterality: Left;    Social History   Tobacco Use  . Smoking status: Light Tobacco Smoker    Types: Cigarettes    Last attempt to quit: 06/24/2016    Years since quitting: 0.8  . Smokeless tobacco: Never Used  . Tobacco comment: 1 pk per day  Substance Use Topics  . Alcohol use: No    Family History  Problem Relation Age of Onset  . Cancer Father  stomach     Current medication list and allergy/intolerance information reviewed:    Current Outpatient Medications  Medication Sig Dispense Refill  . aspirin EC 81 MG tablet Take 81 mg by mouth daily.    Marland Kitchen atorvastatin (LIPITOR) 10 MG tablet  Take 1 tablet (10 mg total) by mouth daily. (Patient not taking: Reported on 12/30/2016) 30 tablet 5  . ibuprofen (ADVIL,MOTRIN) 200 MG tablet Take 400 mg by mouth every 6 (six) hours as needed for mild pain.    Marland Kitchen senna-docusate (SENOKOT-S) 8.6-50 MG tablet Take 1 tablet by mouth at bedtime as needed for mild constipation. (Patient not taking: Reported on 12/30/2016) 60 tablet prn   No current facility-administered medications for this visit.     Allergies  Allergen Reactions  . Penicillins Rash          Review of Systems:  Constitutional:  No  fever, no chills, No recent illness, No unintentional weight changes. No significant fatigue.   HEENT: No  headache, no vision change  Cardiac: No  chest pain, No  pressure, No palpitations, No  Orthopnea  Respiratory:  No  shortness of breath. No  Cough  Gastrointestinal: No  abdominal pain, No  nausea  Musculoskeletal: No new myalgia/arthralgia  Genitourinary: No  incontinence, No  abnormal genital bleeding, No abnormal genital discharge  Skin: No  Rash, No other wounds/concerning lesions  Hem/Onc: No  easy bruising/bleeding, No  abnormal lymph node  Endocrine: No cold intolerance,  No heat intolerance.   Neurologic: No  weakness, No  dizziness, No  slurred speech/focal weakness/facial droop  Psychiatric: No  concerns with depression, +  concerns with anxiety, +sleep problems, No mood problems  Exam:  BP (!) 151/79   Pulse 65   Ht 6\' 4"  (1.93 m)   Wt 209 lb (94.8 kg)   BMI 25.44 kg/m   Constitutional: VS see above. General Appearance: alert, well-developed, well-nourished, NAD  Eyes: Normal lids and conjunctive, non-icteric sclera  Ears, Nose, Mouth, Throat: MMM, Normal external inspection ears/nares/mouth/lips/gums.  Neck: No masses, trachea midline.   Respiratory: Normal respiratory effort. no wheeze, no rhonchi, no rales  Cardiovascular: S1/S2 normal, no murmur, no rub/gallop auscultated. RRR. No lower extremity  edema.   Musculoskeletal: Gait normal.  Neurological: Normal balance/coordination. No tremor.   Skin: warm, dry.    Psychiatric: Normal judgment/insight. Normal mood and affect. Oriented x3.      ASSESSMENT/PLAN:   Chronic systolic congestive heart failure (HCC) - Following with cardiology, medications refilled for now but patient advised that if cardiology wants to change anything, follow their recommendations  Abdominal aortic aneurysm (AAA) without rupture ([place]) - History of this, stable now.  Essential hypertension - I think most likely long-standing uncontrolled hypertension and smoking predisposed him to CHF, this was explained to patient. He verbalizes understanding  Nonischemic cardiomyopathy ([place]) - Following with cardiology  LBBB (left bundle branch block) - Following with cardiology  Situational anxiety - Discussed daily SSRI option, I'm okay to continue benzodiazepine, reduce frequency, counseled on sparing use and avoid use for sleep       Visit summary with medication list and pertinent instructions was printed for patient to review. All questions at time of visit were answered - patient instructed to contact office with any additional concerns. ER/RTC precautions were reviewed with the patient.   Follow-up plan: Return in about 3 months (around 08/06/2017) for recheck anxiety/blood pressure, sooner if needed  .  Note: Total time spent 45 minutes, greater than 50%  of the visit was spent face-to-face counseling and coordinating care for the following: The primary encounter diagnosis was Chronic systolic congestive heart failure ([address]). Diagnoses of Abdominal aortic aneurysm (AAA) without rupture ([place]), Essential hypertension, Nonischemic cardiomyopathy ([place]), LBBB (left bundle branch block), and Situational anxiety were also pertinent to this visit.Marland Kitchen  Please note: voice recognition software was used to produce this document, and typos may escape review. Please contact  Dr. Sheppard Coil for any needed clarifications.

## 2017-05-23 ENCOUNTER — Telehealth: Payer: Self-pay

## 2017-05-23 NOTE — Telephone Encounter (Signed)
As per pharmacy - Entresto 24-26 mg requires PA (BC/BS [phone]). Thanks.

## 2017-06-02 NOTE — Telephone Encounter (Signed)
Pre Authorization sent to cover my meds. [name]

## 2017-06-05 ENCOUNTER — Telehealth: Payer: Self-pay | Admitting: Osteopathic Medicine

## 2017-06-05 NOTE — Telephone Encounter (Signed)
-----   Message from Terance Hart, [place] sent at 06/02/2017  9:23 AM EST ----- So I am initiating  a PA on Entresto and one of the questions the form asks is  Please indicate the patient's NYHA heart failure classification:  NYHA class I    NYHA Class II    NYHA Class III    NYHA Class IV .   I don't know what this means. Tried to look through care everywhere for clues but IDK. Can you help me?

## 2017-06-05 NOTE — Telephone Encounter (Signed)
Class II currently, history of Class III

## 2017-08-07 ENCOUNTER — Encounter: Payer: Self-pay | Admitting: Osteopathic Medicine

## 2017-08-07 ENCOUNTER — Ambulatory Visit (INDEPENDENT_AMBULATORY_CARE_PROVIDER_SITE_OTHER): Payer: BLUE CROSS/BLUE SHIELD | Admitting: Osteopathic Medicine

## 2017-08-07 VITALS — BP 166/96 | HR 63 | Temp 97.6°F | Wt 227.0 lb

## 2017-08-07 DIAGNOSIS — F418 Other specified anxiety disorders: Secondary | ICD-10-CM

## 2017-08-07 DIAGNOSIS — I10 Essential (primary) hypertension: Secondary | ICD-10-CM

## 2017-08-07 DIAGNOSIS — R229 Localized swelling, mass and lump, unspecified: Secondary | ICD-10-CM

## 2017-08-07 MED ORDER — ALPRAZOLAM 0.5 MG PO TABS: 0.5000 mg | ORAL_TABLET | Freq: Two times a day (BID) | ORAL | 0 refills | Status: DC | PRN

## 2017-08-07 MED ORDER — CARVEDILOL 12.5 MG PO TABS: 12.5000 mg | ORAL_TABLET | Freq: Two times a day (BID) | ORAL | 1 refills | Status: DC

## 2017-08-07 NOTE — Progress Notes (Signed)
HPI: Anthony Pierce is a 67 y.o. male who  has a past medical history of CHF (congestive heart failure) ([place]), Diverticulosis, Dysrhythmia, Hemorrhoids, History of AAA (abdominal aortic aneurysm) repair, History of kidney stones, Hyperlipidemia, Hypertension, and Kidney stones.  he presents to [facility] today, 08/07/17,  for chief complaint of:  Follow up BP and anxiety   CHF/HTN: BP elevated today. Unable to get Entresto paid for. Cardiology he thinks was handling this prior auth but still was going to be $400+ for him. No chest pain or SOB, no LE swelling. Following with cardiology. Compliant with meds as below.   Anxiety: situational, dealing with custody batle with spouse, they are going through a divorce. He take the benzos a few time sper week, was seeing a counselor in the past and thinks he will probably go back to see her as well.   Skin nodule: present several years, occasionally catches on things, would like it removed if possible.     Past medical history, surgical history, social history and family history reviewed. No updates needed.   Current medication list and allergy/intolerance information reviewed.    Current Outpatient Medications on File Prior to Visit  Medication Sig Dispense Refill  . aspirin EC 81 MG tablet Take 1 tablet (81 mg total) by mouth daily. 90 tablet 3  . atorvastatin (LIPITOR) 40 MG tablet Take 1 tablet (40 mg total) by mouth at bedtime. 90 tablet 3  . carvedilol (COREG) 6.25 MG tablet Take 1 tablet (6.25 mg total) by mouth 2 (two) times daily with a meal. 180 tablet 1  . furosemide (LASIX) 20 MG tablet Take by mouth.    Marland Kitchen ibuprofen (ADVIL,MOTRIN) 200 MG tablet Take 400 mg by mouth every 6 (six) hours as needed for mild pain.    Marland Kitchen LORazepam (ATIVAN) 0.5 MG tablet Take 1 tablet (0.5 mg total) by mouth 2 (two) times daily as needed for anxiety. 60 tablet 0  . losartan (COZAAR) 50 MG tablet Take by mouth.    .  spironolactone (ALDACTONE) 25 MG tablet Take 1 tablet (25 mg total) by mouth daily. 90 tablet 1  . sacubitril-valsartan (ENTRESTO) 24-26 MG Take 1 tablet by mouth 2 (two) times daily. (Patient not taking: Reported on 08/07/2017) 60 tablet 1   No current facility-administered medications on file prior to visit.    Allergies  Allergen Reactions  . Penicillins Rash    Has patient had a PCN reaction causing immediate rash, facial/tongue/throat swelling, SOB or lightheadedness with hypotension: Yes Has patient had a PCN reaction causing severe rash involving mucus membranes or skin necrosis: No Has patient had a PCN reaction that required hospitalization No Has patient had a PCN reaction occurring within the last 10 years: No If all of the above answers are "NO", then may proceed with Cephalosporin use.       Review of Systems:  Constitutional: No recent illness  HEENT: No  headache, no vision change  Cardiac: No  chest pain, No  pressure, No palpitations  Respiratory:  No  shortness of breath. No  Cough  Gastrointestinal: No  abdominal pain, no change on bowel habits  Musculoskeletal: No new myalgia/arthralgia  Skin: No  Rash  Hem/Onc: No  easy bruising/bleeding, No  abnormal lumps/bumps  Neurologic: No  weakness, No  Dizziness  Psychiatric: No  concerns with depression, No  concerns with anxiety  Exam:  BP (!) 166/96   Pulse 63   Temp 97.6 F (36.4  C) (Oral)   Wt 227 lb 0.6 oz (103 kg)   BMI 27.64 kg/m   Constitutional: VS see above. General Appearance: alert, well-developed, well-nourished, NAD  Eyes: Normal lids and conjunctive, non-icteric sclera  Ears, Nose, Mouth, Throat: MMM, Normal external inspection ears/nares/mouth/lips/gums.  Neck: No masses, trachea midline.   Respiratory: Normal respiratory effort. no wheeze, no rhonchi, no rales  Cardiovascular: S1/S2 normal, no murmur, no rub/gallop auscultated. RRR.   Musculoskeletal: Gait normal. Symmetric and  independent movement of all extremities  Neurological: Normal balance/coordination. No tremor.  Skin: warm, dry, intact.   Psychiatric: Normal judgment/insight. Normal mood and affect. Oriented x3.     ASSESSMENT/PLAN: The primary encounter diagnosis was Essential hypertension. Diagnoses of Situational anxiety and Skin nodule were also pertinent to this visit.   Increase Coreg, consider increase Lasix or Losartan if no improved BP next visit.   Switch to Xanax for anxiety/stress, advised f/u w/ therapist.    Meds ordered this encounter  Medications  . ALPRAZolam (XANAX) 0.5 MG tablet    Sig: Take 1 tablet (0.5 mg total) by mouth 2 (two) times daily as needed for anxiety. Sparing use to prevent tolerance/dependence    Dispense:  30 tablet    Refill:  0  . carvedilol (COREG) 12.5 MG tablet    Sig: Take 1 tablet (12.5 mg total) by mouth 2 (two) times daily with a meal.    Dispense:  60 tablet    Refill:  1    Cancel 6.25 dose      Follow-up plan: Return in about 1 week (around 08/14/2017) for recheck blood pressure on increased Carvedilol dose .  Visit summary with medication list and pertinent instructions was printed for patient to review, alert [place] if any changes needed. All questions at time of visit were answered - patient instructed to contact office with any additional concerns. ER/RTC precautions were reviewed with the patient and understanding verbalized.     Please note: voice recognition software was used to produce this document, and typos may escape review. Please contact Dr. Sheppard Coil for any needed clarifications.

## 2017-08-14 ENCOUNTER — Ambulatory Visit (INDEPENDENT_AMBULATORY_CARE_PROVIDER_SITE_OTHER): Payer: BLUE CROSS/BLUE SHIELD | Admitting: Osteopathic Medicine

## 2017-08-14 ENCOUNTER — Encounter: Payer: Self-pay | Admitting: Osteopathic Medicine

## 2017-08-14 VITALS — BP 150/89 | HR 53 | Temp 98.1°F | Wt 229.1 lb

## 2017-08-14 DIAGNOSIS — I10 Essential (primary) hypertension: Secondary | ICD-10-CM | POA: Diagnosis not present

## 2017-08-14 DIAGNOSIS — D1800 Hemangioma unspecified site: Secondary | ICD-10-CM

## 2017-08-14 NOTE — Progress Notes (Signed)
HPI: Anthony Pierce is a 67 y.o. male who  has a past medical history of CHF (congestive heart failure) ([place]), Diverticulosis, Dysrhythmia, Hemorrhoids, History of AAA (abdominal aortic aneurysm) repair, History of kidney stones, Hyperlipidemia, Hypertension, and Kidney stones.  he presents to [facility] today, 08/14/17,  for chief complaint of:  Follow up BP and anxiety   CHF/HTN: BP elevated today. Unable to get Entresto paid for. Cardiology he thinks was handling this prior auth but still was going to be $400+ for him. No chest pain or SOB, no LE swelling. Following with cardiology. Compliant with meds as below. Unable to get Delene Loll covered - advised f/u w/ cardiology about this for prior auth. Was 166/96 last visit, we increased Coreg from 6.25 to 12.5. Is a little better today. On review of his medications, he went home and confirm that he is not actually taking the losartan on the Lasix.      Past medical history, surgical history, social history and family history reviewed. No updates needed.   Current medication list and allergy/intolerance information reviewed.    Current Outpatient Medications:  .  ALPRAZolam (XANAX) 0.5 MG tablet, Take 1 tablet (0.5 mg total) by mouth 2 (two) times daily as needed for anxiety. Sparing use to prevent tolerance/dependence, Disp: 30 tablet, Rfl: 0 .  aspirin EC 81 MG tablet, Take 1 tablet (81 mg total) by mouth daily., Disp: 90 tablet, Rfl: 3 .  atorvastatin (LIPITOR) 40 MG tablet, Take 1 tablet (40 mg total) by mouth at bedtime., Disp: 90 tablet, Rfl: 3 .  carvedilol (COREG) 12.5 MG tablet, Take 1 tablet (12.5 mg total) by mouth 2 (two) times daily with a meal., Disp: 60 tablet, Rfl: 1 .  furosemide (LASIX) 20 MG tablet, Take by mouth., Disp: , Rfl:  .  ibuprofen (ADVIL,MOTRIN) 200 MG tablet, Take 400 mg by mouth every 6 (six) hours as needed for mild pain., Disp: , Rfl:  .  losartan (COZAAR) 50 MG tablet, Take  by mouth., Disp: , Rfl:  .  spironolactone (ALDACTONE) 25 MG tablet, Take 1 tablet (25 mg total) by mouth daily., Disp: 90 tablet, Rfl: 1  Allergies  Allergen Reactions  . Penicillins Rash    Has patient had a PCN reaction causing immediate rash, facial/tongue/throat swelling, SOB or lightheadedness with hypotension: Yes Has patient had a PCN reaction causing severe rash involving mucus membranes or skin necrosis: No Has patient had a PCN reaction that required hospitalization No Has patient had a PCN reaction occurring within the last 10 years: No If all of the above answers are "NO", then may proceed with Cephalosporin use.       Review of Systems:  Constitutional: No recent illness  HEENT: No  headache, no vision change  Cardiac: No  chest pain, No  pressure, No palpitations  Respiratory:  No  shortness of breath. No  Cough  Gastrointestinal: No  abdominal pain, no change on bowel habits  Musculoskeletal: No new myalgia/arthralgia  Skin: No  Rash  Hem/Onc: No  easy bruising/bleeding, No  abnormal lumps/bumps  Neurologic: No  weakness, No  Dizziness  Psychiatric: No  concerns with depression, No  concerns with anxiety  Exam:  BP (!) 150/89   Pulse (!) 53   Temp 98.1 F (36.7 C) (Oral)   Wt 229 lb 1.9 oz (103.9 kg)   BMI 27.89 kg/m   Constitutional: VS see above. General Appearance: alert, well-developed, well-nourished, NAD  Eyes: Normal lids  and conjunctive, non-icteric sclera  Ears, Nose, Mouth, Throat: MMM, Normal external inspection ears/nares/mouth/lips/gums.  Neck: No masses, trachea midline.   Respiratory: Normal respiratory effort. no wheeze, no rhonchi, no rales  Cardiovascular: S1/S2 normal, no murmur, no rub/gallop auscultated. RRR.   Musculoskeletal: Gait normal. Symmetric and independent movement of all extremities  Neurological: Normal balance/coordination. No tremor.  Skin: warm, dry, intact.   Psychiatric: Normal judgment/insight.  Normal mood and affect. Oriented x3.     ASSESSMENT/PLAN: The encounter diagnosis was Essential hypertension.   Increased Coreg last visit, will get him back on Losartan and Lasix today  Meds ordered this encounter  Medications  . losartan (COZAAR) 50 MG tablet    Sig: Take 1 tablet (50 mg total) by mouth daily.    Dispense:  90 tablet    Refill:  1  . furosemide (LASIX) 20 MG tablet    Sig: Take 1 tablet (20 mg total) by mouth daily.    Dispense:  90 tablet    Refill:  1      Follow-up plan: Return for recheck depending on medications . - Called pt to let him know follow up in one week   Visit summary with medication list and pertinent instructions was printed for patient to review, alert [place] if any changes needed. All questions at time of visit were answered - patient instructed to contact office with any additional concerns. ER/RTC precautions were reviewed with the patient and understanding verbalized.     Please note: voice recognition software was used to produce this document, and typos may escape review. Please contact Dr. Sheppard Coil for any needed clarifications.

## 2017-08-14 NOTE — Patient Instructions (Signed)
[phone] to Tanna Furry to confirm medication list - see last page

## 2017-08-15 ENCOUNTER — Encounter: Payer: Self-pay | Admitting: Osteopathic Medicine

## 2017-08-15 MED ORDER — FUROSEMIDE 20 MG PO TABS: 20.0000 mg | ORAL_TABLET | Freq: Every day | ORAL | 1 refills | Status: DC

## 2017-08-15 MED ORDER — LOSARTAN POTASSIUM 50 MG PO TABS: 50.0000 mg | ORAL_TABLET | Freq: Every day | ORAL | 1 refills | Status: DC

## 2017-08-18 ENCOUNTER — Telehealth: Payer: Self-pay | Admitting: Osteopathic Medicine

## 2017-08-18 NOTE — Telephone Encounter (Signed)
-----   Message from Emeterio Reeve, DO sent at 08/15/2017  9:50 AM EDT ----- Regarding: follow up 08/14/17 visit  I sent the losartan and the Lasix into [facility] for him. He can go ahead and take these medications in addition to what he is already on - can reference the list printed in the office, would have him follow-up again in one week to recheck blood pressure on the proper medicines.

## 2017-08-18 NOTE — Telephone Encounter (Signed)
Pt advised. He picked up new Rx's today, NV scheduled for next week.

## 2017-08-21 ENCOUNTER — Ambulatory Visit: Payer: BLUE CROSS/BLUE SHIELD | Admitting: Osteopathic Medicine

## 2017-08-25 ENCOUNTER — Ambulatory Visit (INDEPENDENT_AMBULATORY_CARE_PROVIDER_SITE_OTHER): Payer: BLUE CROSS/BLUE SHIELD | Admitting: Family Medicine

## 2017-08-25 VITALS — BP 135/79 | HR 58 | Temp 98.1°F | Wt 229.0 lb

## 2017-08-25 DIAGNOSIS — I10 Essential (primary) hypertension: Secondary | ICD-10-CM | POA: Diagnosis not present

## 2017-08-25 NOTE — Progress Notes (Signed)
Pt presented today for blood pressure check. At last visit on 08/14/17, pt's blood pressure reading was 150/89, Pulse 53. As per pt - feeling better & not "feeling as flushed" since taking lasix & losartan. Pt had no chest pain/sob or new complaints today. Blood pressure reading was 135/79, pulse 58. Pt informed to continue monitoring bp readings at home & to inform us of any changes. Pt was instructed to make a follow up appt on 11/14/2017 for 3 mths check. No other inquiries asked during nurse visit.

## 2017-10-06 ENCOUNTER — Telehealth: Payer: Self-pay

## 2017-10-06 ENCOUNTER — Other Ambulatory Visit: Payer: Self-pay | Admitting: Physician Assistant

## 2017-10-06 MED ORDER — ALPRAZOLAM 0.5 MG PO TABS: 0.5000 mg | ORAL_TABLET | Freq: Two times a day (BID) | ORAL | 0 refills | Status: DC | PRN

## 2017-10-06 NOTE — Telephone Encounter (Signed)
Pt called requesting med RF for alprazolam & a letter to take to court re: his heart condition. Pt wants RF sent to [place].

## 2017-10-06 NOTE — Progress Notes (Signed)
Completed letter for jury duty.

## 2017-10-06 NOTE — Telephone Encounter (Signed)
The letter was written to get out of jury duty. Can you change the wording to support this and I will sign tomorrow.

## 2017-10-06 NOTE — Telephone Encounter (Signed)
Xanax refill sent. Letter made for jury duty does it need to be for something other than jury duty? I didn't know what "court" meant.

## 2017-10-06 NOTE — Telephone Encounter (Signed)
Spoke to patient re: letter clarification for court. As per pt, going through the process of separation from spouse. Anthony Pierce requested letter of proof stating he is currently under the care of Dr. Sheppard Coil for heart conditions/multiple chronic diseases. Pt will stop by the office later on today to pick up letter. Thanks.

## 2017-10-07 ENCOUNTER — Encounter: Payer: Self-pay | Admitting: Physician Assistant

## 2017-10-07 NOTE — Telephone Encounter (Signed)
Pt informed that letter is available for pick up at front desk today.

## 2017-10-07 NOTE — Telephone Encounter (Signed)
Done

## 2017-10-07 NOTE — Telephone Encounter (Signed)
Letter placed in provider's box, pending signature.

## 2017-10-07 NOTE — Telephone Encounter (Signed)
Letter updated and printed.

## 2017-10-09 NOTE — Telephone Encounter (Signed)
Pt stopped by office today stating that letter must indicate "Patient cannot do physical labor". An updated letter has been placed in provider's box pending signature.

## 2017-10-16 NOTE — Telephone Encounter (Signed)
Pt picked up revised letter at front desk on 10/10/2017.

## 2017-10-18 ENCOUNTER — Other Ambulatory Visit: Payer: Self-pay | Admitting: Osteopathic Medicine

## 2017-10-20 ENCOUNTER — Other Ambulatory Visit: Payer: Self-pay | Admitting: Osteopathic Medicine

## 2017-11-13 ENCOUNTER — Other Ambulatory Visit: Payer: Self-pay | Admitting: Osteopathic Medicine

## 2017-11-13 NOTE — Telephone Encounter (Signed)
[place] requesting med RF for alprazolam.

## 2017-11-14 NOTE — Telephone Encounter (Signed)
Pt has been updated.  

## 2017-11-19 ENCOUNTER — Ambulatory Visit (INDEPENDENT_AMBULATORY_CARE_PROVIDER_SITE_OTHER): Payer: BLUE CROSS/BLUE SHIELD | Admitting: Osteopathic Medicine

## 2017-11-19 ENCOUNTER — Encounter: Payer: Self-pay | Admitting: Osteopathic Medicine

## 2017-11-19 VITALS — BP 145/90 | HR 56 | Temp 97.8°F | Wt 233.6 lb

## 2017-11-19 DIAGNOSIS — N183 Chronic kidney disease, stage 3 unspecified: Secondary | ICD-10-CM | POA: Insufficient documentation

## 2017-11-19 DIAGNOSIS — I5022 Chronic systolic (congestive) heart failure: Secondary | ICD-10-CM | POA: Diagnosis not present

## 2017-11-19 DIAGNOSIS — R0602 Shortness of breath: Secondary | ICD-10-CM

## 2017-11-19 DIAGNOSIS — I10 Essential (primary) hypertension: Secondary | ICD-10-CM | POA: Diagnosis not present

## 2017-11-19 DIAGNOSIS — Z23 Encounter for immunization: Secondary | ICD-10-CM | POA: Diagnosis not present

## 2017-11-19 NOTE — Patient Instructions (Addendum)
Look in your medicines - make sure what you're taking matches the list we have on file for you.

## 2017-11-19 NOTE — Progress Notes (Signed)
HPI: Anthony Pierce is a 67 y.o. male who  has a past medical history of CHF (congestive heart failure) ([place]), Diverticulosis, Dysrhythmia, Hemorrhoids, History of AAA (abdominal aortic aneurysm) repair, History of kidney stones, Hyperlipidemia, Hypertension, and Kidney stones.  he presents to [facility] today, 11/19/17,  for chief complaint of:  BP/CHF f/u  CHF/HTN:   Last visit w/ me was elevated but med list was a bit of a mess: he did confirm later that he was not actually taking losartan or lasix. BP at nurse follow-up w/ restrating these meds looked a lot better, 150/89 --> 135/79. BP was 148/80 on 10/13/17  Following with cardiology though records look like he's had some studies done but no official follow-up with NP or Dr.   Janece Canterbury meds as below.ASA, BB, statin, Lasix, spironolactone taking. Not sure if he's taking losartan. Has been unable to get Entresto covered - advised f/u w/ cardiology about this for prior auth, he was told meds would still be >$400.   Echo 10/13/17 no results available. Note- EF at diagnosis was 25%   08/18/17: CMP Cr 1.54 - about baseline   Coronary CTA 06/05/17: Mild nonobstructive coronary artery disease as described. Dilated left ventricle.  Normal left ventricular ejection fraction. Few small stable lung nodules 4 mm or less.   Still SOB on occasion, not consistently w/ activity but it never really happens at rest. Quit smoking but up to 1.5 ppd x25+ years     Past medical history, surgical history, and family history reviewed.  Current medication list and allergy/intolerance information reviewed.   (See remainder of HPI, ROS, Phys Exam below)  BP (!) 145/90 (BP Location: Right Arm, Patient Position: Sitting, Cuff Size: Normal)   Pulse (!) 56   Temp 97.8 F (36.6 C) (Oral)   Wt 233 lb 9.6 oz (106 kg)   BMI 28.43 kg/m    ASSESSMENT/PLAN:   Chronic systolic congestive heart failure (HCC)  Essential  hypertension  CKD (chronic kidney disease) stage 3, GFR 30-59 ml/min (HCC)  Need for Tdap vaccination - Plan: Tdap vaccine greater than or equal to 7yo IM  Shortness of breath     Patient Instructions  Look in your medicines - make sure what you're taking matches the list we have on file for you.      Follow-up plan: Return for lung function test next week or two, will recheck BP at that visit as well .     ############################################ ############################################ ############################################ ############################################    Outpatient Encounter Medications as of 11/19/2017  Medication Sig Note  . ALPRAZolam (XANAX) 0.5 MG tablet TAKE ONE TABLET BY MOUTH TWICE DAILY AS NEEDED ANXIETY. *SPARINGLY USE TO PREVENT DEPENDENCE* 11/19/2017: PRN  . aspirin EC 81 MG tablet Take 1 tablet (81 mg total) by mouth daily.   Marland Kitchen atorvastatin (LIPITOR) 40 MG tablet Take 1 tablet (40 mg total) by mouth at bedtime.   . carvedilol (COREG) 12.5 MG tablet TAKE ONE TABLET BY MOUTH TWICE DAILY WITH A MEAL   . furosemide (LASIX) 20 MG tablet Take 1 tablet (20 mg total) by mouth daily.   Marland Kitchen ibuprofen (ADVIL,MOTRIN) 200 MG tablet Take 400 mg by mouth every 6 (six) hours as needed for mild pain.   Marland Kitchen spironolactone (ALDACTONE) 25 MG tablet Take 1 tablet (25 mg total) by mouth daily.   Marland Kitchen losartan (COZAAR) 50 MG tablet Take 1 tablet (50 mg total) by mouth daily. (Patient not taking: Reported on 11/19/2017)  No facility-administered encounter medications on file as of 11/19/2017.    Allergies  Allergen Reactions  . Penicillins Rash and Other (See Comments)    Has patient had a PCN reaction causing immediate rash, facial/tongue/throat swelling, SOB or lightheadedness with hypotension: Yes Has patient had a PCN reaction causing severe rash involving mucus membranes or skin necrosis: No Has patient had a PCN reaction that required hospitalization  No Has patient had a PCN reaction occurring within the last 10 years: No If all of the above answers are "NO", then may proceed with Cephalosporin use.        Review of Systems:  Constitutional: No recent illness  HEENT: No  headache, no vision change  Cardiac: No  chest pain, No  pressure, No palpitations, no orthopnea, no LE edema  Respiratory:  +shortness of breath. No  Cough  Gastrointestinal: No  abdominal pain, no change on bowel habits  Musculoskeletal: No new myalgia/arthralgia  Skin: No  Rash  Neurologic: No  weakness, No  Dizziness  Psychiatric: No  concerns with depression, No  concerns with anxiety  Exam:  BP (!) 145/90 (BP Location: Right Arm, Patient Position: Sitting, Cuff Size: Normal)   Pulse (!) 56   Temp 97.8 F (36.6 C) (Oral)   Wt 233 lb 9.6 oz (106 kg)   BMI 28.43 kg/m   Constitutional: VS see above. General Appearance: alert, well-developed, well-nourished, NAD  Eyes: Normal lids and conjunctive, non-icteric sclera  Ears, Nose, Mouth, Throat: MMM, Normal external inspection ears/nares/mouth/lips/gums.  Neck: No masses, trachea midline.   Respiratory: Normal respiratory effort. no wheeze, no rhonchi, no rales  Cardiovascular: S1/S2 normal, no murmur, no rub/gallop auscultated. RRR. No JVD or LE edema   Musculoskeletal: Gait normal. Symmetric and independent movement of all extremities  Neurological: Normal balance/coordination. No tremor.  Skin: warm, dry, intact.   Psychiatric: Normal judgment/insight. Normal mood and affect. Oriented x3.   Visit summary with medication list and pertinent instructions was printed for patient to review, advised to alert [place] if any changes needed. All questions at time of visit were answered - patient instructed to contact office with any additional concerns. ER/RTC precautions were reviewed with the patient and understanding verbalized.   Follow-up plan: Return for lung function test next week or two,  will recheck BP at that visit as well .  Note: Total time spent 25 minutes, greater than 50% of the visit was spent face-to-face counseling and coordinating care for the following: The primary encounter diagnosis was Chronic systolic congestive heart failure ([place]). Diagnoses of Essential hypertension, CKD (chronic kidney disease) stage 3, GFR 30-59 ml/min (HCC), Need for Tdap vaccination, and Shortness of breath were also pertinent to this visit.Marland Kitchen  Please note: voice recognition software was used to produce this document, and typos may escape review. Please contact Dr. Sheppard Coil for any needed clarifications.

## 2017-11-24 ENCOUNTER — Telehealth: Payer: Self-pay

## 2017-11-24 NOTE — Telephone Encounter (Signed)
Pt left a vm msg stating that he stopped taking spironolactone medication. He said he recalled that he was told to not to take med anymore. Pt wants to make sure that information was correct. Requesting feedback from provider. Thanks.

## 2017-11-25 NOTE — Telephone Encounter (Signed)
From last cardiology note, there is no mention of stopping this medicine - he should be taking it, or he should check with cardiology if he has concerns about it. Can he confirm for [place] when did he stop taking the spironolactone?

## 2017-11-25 NOTE — Telephone Encounter (Signed)
Noted  

## 2017-11-25 NOTE — Telephone Encounter (Signed)
Pt called cardiologist office - as per nurse Nunzio Cory, pt was instructed by Dr. Beverlyn Roux to discontinue spironolactone med. Pt states it was because it was "causing his elevation to be too high". Nunzio Cory is requesting a call back from provider to discuss in details with provider and can be contacted at [phone].

## 2017-11-25 NOTE — Telephone Encounter (Signed)
Per pt, informed by cardiologist to stop taking spironolactone med. Pt stop med one month ago. He is going to contact Cardiologist office for clarification. Pt will return call back with an update.

## 2017-12-02 ENCOUNTER — Ambulatory Visit: Payer: BLUE CROSS/BLUE SHIELD | Admitting: Osteopathic Medicine

## 2017-12-05 NOTE — Telephone Encounter (Signed)
Called back andleft message to fax [place] or call w/ his med list from caridology - I can't see an update beyond the recent OV, no notes associated w/ more recent echo procedure that I can find which mention med change

## 2017-12-11 ENCOUNTER — Ambulatory Visit (INDEPENDENT_AMBULATORY_CARE_PROVIDER_SITE_OTHER): Payer: Medicare Other | Admitting: Osteopathic Medicine

## 2017-12-11 VITALS — BP 141/86 | HR 61 | Ht 76.0 in | Wt 235.0 lb

## 2017-12-11 DIAGNOSIS — I10 Essential (primary) hypertension: Secondary | ICD-10-CM | POA: Diagnosis not present

## 2017-12-11 DIAGNOSIS — R0602 Shortness of breath: Secondary | ICD-10-CM

## 2017-12-11 DIAGNOSIS — R942 Abnormal results of pulmonary function studies: Secondary | ICD-10-CM

## 2017-12-11 DIAGNOSIS — I5022 Chronic systolic (congestive) heart failure: Secondary | ICD-10-CM

## 2017-12-11 MED ORDER — ALBUTEROL SULFATE (2.5 MG/3ML) 0.083% IN NEBU
2.5000 mg | INHALATION_SOLUTION | Freq: Once | RESPIRATORY_TRACT | Status: AC
  Administered 2017-12-11: 2.5 mg via RESPIRATORY_TRACT

## 2017-12-11 NOTE — Progress Notes (Signed)
HPI: Anthony Pierce is a 67 y.o. male who  has a past medical history of CHF (congestive heart failure) ([place]), Diverticulosis, Dysrhythmia, Hemorrhoids, History of AAA (abdominal aortic aneurysm) repair, History of kidney stones, Hyperlipidemia, Hypertension, and Kidney stones.  he presents to [facility] today, 12/11/17,  for chief complaint of:  Spirometry  Blood pressure follow-up   SOB on occasion, not consistently w/ activity but it never really happens at rest.   Quit smoking about few months ago, but was smoking up to 1.5 ppd x25+ years    Blood pressure elevated on intake, improved but not quite to goal prior to discharge, he states home blood pressures are systolic typically in the 120s.  Had some confusion with his medication list, recent records received from cardiology give list of aspirin 81 daily, atorvastatin 40 daily, Coreg 6.25 daily, Lasix 20 daily, Cozaar 50 daily   Past medical history, surgical history, and family history reviewed.  Current medication list and allergy/intolerance information reviewed.   (See remainder of HPI, ROS, Phys Exam below)  PFT INTERPRETATION Valid study? Borderline flow/vol curve Flow-Volume Loop: abnormal FEV1/FVC: It ranges anywhere from 36-72 Bronchodilator Challenge psyche actually got worse  FVC <80% = Restrictive yes   Previous CXR reviewed: 2017 No significant hyperinflation on personal review of the images   Previous CT scan of the lungs, 04/2017 performed as CTA looking for PE but did demonstrate 0.4 cm lung nodule, probable need to follow-up in about a year is what he was told   ASSESSMENT/PLAN:   I am having some difficulty interpreting this PFT, nurse who performed the spirometry mentioned he had some difficulty with the procedure, we will go ahead and send to pulmonology for full evaluation.  His COPD certainly seems likely clinically given shortness of breath issues though he has  cardiac reasons for S OB as well.  No orthopnea or other clinical signs of heart failure at this time.  Shortness of breath - Plan: PR EVAL OF BRONCHOSPASM, albuterol (PROVENTIL) (2.5 MG/3ML) 0.083% nebulizer solution 2.5 mg, Ambulatory referral to Pulmonology  Abnormal pulmonary function test - Plan: Ambulatory referral to Pulmonology  Essential hypertension - Need to confirm medication list, verify home blood pressure cuff  Chronic systolic congestive heart failure ([place])   Meds ordered this encounter  Medications  . albuterol (PROVENTIL) (2.5 MG/3ML) 0.083% nebulizer solution 2.5 mg    Patient Instructions   Cardiologist and I have different doses for Carvedilol aka Coreg, are you taking 6.25 mg or 12.5 mg daily? Call us and let us know!   Would stay off Spironolactone unless cardiology says otherwise    Follow-up plan: Return in about 3 months (around 03/13/2018) for recheck BP and breathing, see me sooner if needed .     ############################################ ############################################ ############################################ ############################################    Outpatient Encounter Medications as of 12/11/2017  Medication Sig Note  . ALPRAZolam (XANAX) 0.5 MG tablet TAKE ONE TABLET BY MOUTH TWICE DAILY AS NEEDED ANXIETY. *SPARINGLY USE TO PREVENT DEPENDENCE* 11/19/2017: PRN  . aspirin EC 81 MG tablet Take 1 tablet (81 mg total) by mouth daily.   Marland Kitchen atorvastatin (LIPITOR) 40 MG tablet Take 1 tablet (40 mg total) by mouth at bedtime.   . carvedilol (COREG) 12.5 MG tablet TAKE ONE TABLET BY MOUTH TWICE DAILY WITH A MEAL   . furosemide (LASIX) 20 MG tablet Take 1 tablet (20 mg total) by mouth daily.   Marland Kitchen ibuprofen (ADVIL,MOTRIN) 200 MG tablet Take 400  mg by mouth every 6 (six) hours as needed for mild pain.   Marland Kitchen losartan (COZAAR) 50 MG tablet Take 1 tablet (50 mg total) by mouth daily.   Marland Kitchen spironolactone (ALDACTONE) 25 MG tablet Take 1  tablet (25 mg total) by mouth daily. (Patient not taking: Reported on 12/11/2017)   . [EXPIRED] albuterol (PROVENTIL) (2.5 MG/3ML) 0.083% nebulizer solution 2.5 mg     No facility-administered encounter medications on file as of 12/11/2017.    Allergies  Allergen Reactions  . Penicillins Rash and Other (See Comments)    Has patient had a PCN reaction causing immediate rash, facial/tongue/throat swelling, SOB or lightheadedness with hypotension: Yes Has patient had a PCN reaction causing severe rash involving mucus membranes or skin necrosis: No Has patient had a PCN reaction that required hospitalization No Has patient had a PCN reaction occurring within the last 10 years: No If all of the above answers are "NO", then may proceed with Cephalosporin use.        Review of Systems:  Constitutional: No recent illness  HEENT: No  headache, no vision change  Cardiac: No  chest pain, No  pressure, No palpitations  Respiratory:  +shortness of breath. +Cough  Gastrointestinal: No  abdominal pain  Musculoskeletal: No new myalgia/arthralgia  Neurologic: No  weakness, No  Dizziness  Psychiatric: No  concerns with depression, No  concerns with anxiety  Exam:  BP (!) 141/86   Pulse 61   Ht 6\' 4"  (1.93 m)   Wt 235 lb (106.6 kg)   SpO2 98%   BMI 28.61 kg/m   Constitutional: VS see above. General Appearance: alert, well-developed, well-nourished, NAD  Eyes: Normal lids and conjunctive, non-icteric sclera  Ears, Nose, Mouth, Throat: MMM, Normal external inspection ears/nares/mouth/lips/gums.  Neck: No masses, trachea midline.   Respiratory: Normal respiratory effort. no wheeze, no rhonchi, no rales  Cardiovascular: S1/S2 normal, no murmur, no rub/gallop auscultated. RRR.   Musculoskeletal: Gait normal. Symmetric and independent movement of all extremities  Neurological: Normal balance/coordination. No tremor.  Skin: warm, dry, intact.   Psychiatric: Normal judgment/insight.  Normal mood and affect. Oriented x3.   Visit summary with medication list and pertinent instructions was printed for patient to review, advised to alert [place] if any changes needed. All questions at time of visit were answered - patient instructed to contact office with any additional concerns. ER/RTC precautions were reviewed with the patient and understanding verbalized.   Follow-up plan: Return in about 3 months (around 03/13/2018) for recheck BP and breathing, see me sooner if needed .  Note: Total time spent 25 minutes, greater than 50% of the visit was spent face-to-face counseling and coordinating care for the following: The primary encounter diagnosis was Shortness of breath. Diagnoses of Abnormal pulmonary function test, Essential hypertension, and Chronic systolic congestive heart failure ([place]) were also pertinent to this visit.Marland Kitchen  Please note: voice recognition software was used to produce this document, and typos may escape review. Please contact Dr. Sheppard Coil for any needed clarifications.

## 2017-12-11 NOTE — Patient Instructions (Addendum)
   Cardiologist and I have different doses for Carvedilol aka Coreg, are you taking 6.25 mg or 12.5 mg daily? Call us and let us know!   Would stay off Spironolactone unless cardiology says otherwise

## 2017-12-12 ENCOUNTER — Other Ambulatory Visit: Payer: Self-pay | Admitting: Osteopathic Medicine

## 2018-01-06 ENCOUNTER — Encounter: Payer: Self-pay | Admitting: Pulmonary Disease

## 2018-01-06 ENCOUNTER — Ambulatory Visit (INDEPENDENT_AMBULATORY_CARE_PROVIDER_SITE_OTHER): Payer: Medicare Other | Admitting: Pulmonary Disease

## 2018-01-06 VITALS — BP 132/74 | HR 63 | Ht 76.0 in | Wt 237.1 lb

## 2018-01-06 DIAGNOSIS — R0600 Dyspnea, unspecified: Secondary | ICD-10-CM

## 2018-01-06 DIAGNOSIS — R911 Solitary pulmonary nodule: Secondary | ICD-10-CM | POA: Diagnosis not present

## 2018-01-06 DIAGNOSIS — R0602 Shortness of breath: Secondary | ICD-10-CM

## 2018-01-06 DIAGNOSIS — I428 Other cardiomyopathies: Secondary | ICD-10-CM | POA: Diagnosis not present

## 2018-01-06 NOTE — Patient Instructions (Signed)
Plan: Pulmonary nodule: We will arrange for a follow up CT scan of your chest in 04/2018  Congestive heart failure: Keep taking the medicines you are taking for this and keep follow up appointments with cardiology  Prior tobacco use: In 2020 you will need to start having screening CT scans of the chest  Dyspnea: We will arrange for a full pulmonary function test and then decide if more testing is needed  We will see you back in 1-2 weeks to go over the result of the lung function test.

## 2018-01-06 NOTE — Progress Notes (Signed)
Synopsis: Referred in Aug 2019 for Dyspnea  Subjective:   PATIENT ID: Anthony Pierce GENDER: male DOB: Dec 18, 1950, MRN: [id number]   HPI  Chief Complaint  Patient presents with  . pulmonary consult    referred for SOB   [name] is here to see me for dyspnea: > he has noticed it occasionally, it is not constant > he was hospitalized for CHF in November 2018 and has been sick since then with dyspnea.  > he had a aortic aneurysm repaired by Dr. Trula Slade in 2018. > he says that he thought his recent divorce caused the heart failure. > no tightness or wheezing when dyspneic > he doesn' thave chest pain > he may wheeze some when short of breaht > he coughs up a lot of phlegm, mostly in the mornings.   He is seeing Dr. Kathreen Cornfield with cardiology.  He says that his LVEF is improving.  He does not have a problem with leg swelling.  He is compliant with his medications .  He smoked 1.5ppd for 35 years, quit in 2019.    He has CHF and was seen in the hospital a recently.   Past Medical History:  Diagnosis Date  . CHF (congestive heart failure) ([place])   . Diverticulosis   . Dysrhythmia    heart palpitations infrequent  . Hemorrhoids   . History of AAA (abdominal aortic aneurysm) repair   . History of kidney stones   . Hyperlipidemia   . Hypertension   . Kidney stones      Family History  Problem Relation Age of Onset  . Cancer Father        stomach     Social History   Socioeconomic History  . Marital status: Legally Separated    Spouse name: Not on file  . Number of children: Not on file  . Years of education: Not on file  . Highest education level: Not on file  Occupational History  . Not on file  Social Needs  . Financial resource strain: Not on file  . Food insecurity:    Worry: Not on file    Inability: Not on file  . Transportation needs:    Medical: Not on file    Non-medical: Not on file  Tobacco Use  . Smoking status: Former Smoker    Types:  Cigarettes    Last attempt to quit: 05/02/2017    Years since quitting: 0.6  . Smokeless tobacco: Never Used  . Tobacco comment: 1 pk per day  Substance and Sexual Activity  . Alcohol use: No  . Drug use: No  . Sexual activity: Never  Lifestyle  . Physical activity:    Days per week: Not on file    Minutes per session: Not on file  . Stress: Not on file  Relationships  . Social connections:    Talks on phone: Not on file    Gets together: Not on file    Attends religious service: Not on file    Active member of club or organization: Not on file    Attends meetings of clubs or organizations: Not on file    Relationship status: Not on file  . Intimate partner violence:    Fear of current or ex partner: Not on file    Emotionally abused: Not on file    Physically abused: Not on file    Forced sexual activity: Not on file  Other Topics Concern  . Not on file  Social History Narrative  . Not on file     Allergies  Allergen Reactions  . Penicillins Rash and Other (See Comments)    Has patient had a PCN reaction causing immediate rash, facial/tongue/throat swelling, SOB or lightheadedness with hypotension: Yes Has patient had a PCN reaction causing severe rash involving mucus membranes or skin necrosis: No Has patient had a PCN reaction that required hospitalization No Has patient had a PCN reaction occurring within the last 10 years: No If all of the above answers are "NO", then may proceed with Cephalosporin use.       Outpatient Medications Prior to Visit  Medication Sig Dispense Refill  . ALPRAZolam (XANAX) 0.5 MG tablet TAKE ONE TABLET BY MOUTH TWICE DAILY AS NEEDED ANXIETY. *SPARINGLY USE TO PREVENT DEPENDENCE* 30 tablet 0  . aspirin EC 81 MG tablet Take 1 tablet (81 mg total) by mouth daily. 90 tablet 3  . atorvastatin (LIPITOR) 40 MG tablet Take 1 tablet (40 mg total) by mouth at bedtime. 90 tablet 3  . carvedilol (COREG) 12.5 MG tablet TAKE ONE TABLET BY MOUTH  TWICE DAILY WITH A MEAL 60 tablet 1  . furosemide (LASIX) 20 MG tablet Take 1 tablet (20 mg total) by mouth daily. 90 tablet 1  . ibuprofen (ADVIL,MOTRIN) 200 MG tablet Take 400 mg by mouth every 6 (six) hours as needed for mild pain.    Marland Kitchen losartan (COZAAR) 50 MG tablet Take 1 tablet (50 mg total) by mouth daily. 90 tablet 1   No facility-administered medications prior to visit.     Review of Systems  Constitutional: Negative for chills, fever, malaise/fatigue and weight loss.  HENT: Negative for congestion, nosebleeds, sinus pain and sore throat.   Eyes: Negative for photophobia, pain and discharge.  Respiratory: Positive for cough, sputum production and shortness of breath. Negative for hemoptysis and wheezing.   Cardiovascular: Negative for chest pain, palpitations, orthopnea and leg swelling.  Gastrointestinal: Negative for abdominal pain, constipation, diarrhea, nausea and vomiting.  Genitourinary: Negative for dysuria, frequency, hematuria and urgency.  Musculoskeletal: Negative for back pain, joint pain, myalgias and neck pain.  Skin: Negative for itching and rash.  Neurological: Negative for tingling, tremors, sensory change, speech change, focal weakness, seizures, weakness and headaches.  Psychiatric/Behavioral: Negative for memory loss, substance abuse and suicidal ideas. The patient is not nervous/anxious.       Objective:  Physical Exam   Vitals:   01/06/18 1149  BP: 132/74  Pulse: 63  SpO2: 97%  Weight: 237 lb 1.3 oz (107.5 kg)  Height: 6\' 4"  (1.93 m)    Gen: well appearing, no acute distress HENT: NCAT, OP clear, neck supple without masses Eyes: PERRL, EOMi Lymph: no cervical lymphadenopathy PULM: CTA B CV: RRR, systolic murmur RUSB, no JVD GI: BS+, soft, nontender, no hsm Derm: no rash or skin breakdown MSK: normal bulk and tone Neuro: A&Ox4, CN II-XII intact, strength 5/5 in all 4 extremities Psyche: normal mood and affect   CBC    Component Value  Date/Time   WBC 15.7 (H) 07/12/2016 0451   RBC 4.07 (L) 07/12/2016 0451   HGB 12.1 (L) 07/12/2016 0451   HCT 36.3 (L) 07/12/2016 0451   PLT 219 07/12/2016 0451   MCV 89.2 07/12/2016 0451   MCH 29.7 07/12/2016 0451   MCHC 33.3 07/12/2016 0451   RDW 13.9 07/12/2016 0451   LYMPHSABS 1.8 03/26/2016 2102   MONOABS 0.5 03/26/2016 2102   EOSABS 0.0 03/26/2016 2102   BASOSABS 0.0  03/26/2016 2102     Chest imaging: November 2017 CT abdomen pelvis: Lung windows reviewed showing no pulmonary parenchymal abnormality November 2018 CT angiogram > no PE; interstitial edema and small bilateral effusions noted, mild bibasilar atelectasis, small bilateral pulmonary nodules, largest is 5mm  PFT: August 2019, ratio 86%, FEV1 2.75 L 65% predicted, FVC 3.2 L 56% predicted  Labs:  Path:  Echo: 04/2017 TTE> LVEF 25-30%, mild LVH, mild MR, diffuse hypokinesis of left ventricul  Heart Catheterization:  Visit with his primary care physician from this year reviewed where he was seen for dyspnea.  He has a past medical history significant for congestive heart failure.     Assessment & Plan:   Dyspnea, unspecified type - Plan: Spirometry with graph, Pulmonary Function Test  Pulmonary nodule  Nonischemic cardiomyopathy ([place])  Discussion: This is a pleasant 67 year old former smoker who comes to my clinic today for evaluation of shortness of breath.  His spirometry test did not show evidence of COPD but was suggestive of restriction.  He needs full pulmonary function testing to evaluate further.   He has pulmonary nodules which are 4 mm in size.  While not worrisome at this size, this needs to be followed up on with a repeat CT scan of the chest in November 2019 to make sure they have not grown.  Plan: Pulmonary nodule: We will arrange for a follow up CT scan of your chest in 04/2018  Congestive heart failure: Keep taking the medicines you are taking for this and keep follow up appointments  with cardiology  Prior tobacco use: In 2020 you will need to start having screening CT scans of the chest  Dyspnea: We will arrange for a full pulmonary function test and then decide if more testing is needed  We will see you back in 1-2 weeks to go over the result of the lung function test.   Current Outpatient Medications:  .  ALPRAZolam (XANAX) 0.5 MG tablet, TAKE ONE TABLET BY MOUTH TWICE DAILY AS NEEDED ANXIETY. *SPARINGLY USE TO PREVENT DEPENDENCE*, Disp: 30 tablet, Rfl: 0 .  aspirin EC 81 MG tablet, Take 1 tablet (81 mg total) by mouth daily., Disp: 90 tablet, Rfl: 3 .  atorvastatin (LIPITOR) 40 MG tablet, Take 1 tablet (40 mg total) by mouth at bedtime., Disp: 90 tablet, Rfl: 3 .  carvedilol (COREG) 12.5 MG tablet, TAKE ONE TABLET BY MOUTH TWICE DAILY WITH A MEAL, Disp: 60 tablet, Rfl: 1 .  furosemide (LASIX) 20 MG tablet, Take 1 tablet (20 mg total) by mouth daily., Disp: 90 tablet, Rfl: 1 .  ibuprofen (ADVIL,MOTRIN) 200 MG tablet, Take 400 mg by mouth every 6 (six) hours as needed for mild pain., Disp: , Rfl:  .  losartan (COZAAR) 50 MG tablet, Take 1 tablet (50 mg total) by mouth daily., Disp: 90 tablet, Rfl: 1

## 2018-01-06 NOTE — Addendum Note (Signed)
Addended by: Della Goo C on: 01/06/2018 12:34 PM   Modules accepted: Orders

## 2018-01-14 ENCOUNTER — Ambulatory Visit (INDEPENDENT_AMBULATORY_CARE_PROVIDER_SITE_OTHER): Payer: Medicare Other | Admitting: Pulmonary Disease

## 2018-01-14 DIAGNOSIS — R0600 Dyspnea, unspecified: Secondary | ICD-10-CM | POA: Diagnosis not present

## 2018-01-14 LAB — PULMONARY FUNCTION TEST
DL/VA % PRED: 74 %
DL/VA: 3.53 ml/min/mmHg/L
DLCO UNC % PRED: 65 %
DLCO UNC: 23.86 ml/min/mmHg
FEF 25-75 POST: 2.51 L/s
FEF 25-75 PRE: 2.13 L/s
FEF2575-%Change-Post: 17 %
FEF2575-%PRED-POST: 83 %
FEF2575-%PRED-PRE: 70 %
FEV1-%CHANGE-POST: 3 %
FEV1-%Pred-Post: 82 %
FEV1-%Pred-Pre: 79 %
FEV1-Post: 3.16 L
FEV1-Pre: 3.06 L
FEV1FVC-%Change-Post: 0 %
FEV1FVC-%PRED-PRE: 97 %
FEV6-%CHANGE-POST: 3 %
FEV6-%Pred-Post: 88 %
FEV6-%Pred-Pre: 85 %
FEV6-Post: 4.3 L
FEV6-Pre: 4.16 L
FEV6FVC-%CHANGE-POST: 0 %
FEV6FVC-%Pred-Post: 104 %
FEV6FVC-%Pred-Pre: 104 %
FVC-%Change-Post: 3 %
FVC-%Pred-Post: 84 %
FVC-%Pred-Pre: 81 %
FVC-Post: 4.32 L
FVC-Pre: 4.19 L
POST FEV1/FVC RATIO: 73 %
PRE FEV1/FVC RATIO: 73 %
Post FEV6/FVC ratio: 99 %
Pre FEV6/FVC Ratio: 99 %
RV % pred: 114 %
RV: 2.86 L
TLC % pred: 93 %
TLC: 7.11 L

## 2018-01-14 NOTE — Progress Notes (Signed)
PFT done today. 

## 2018-02-03 ENCOUNTER — Encounter: Payer: Self-pay | Admitting: Pulmonary Disease

## 2018-02-03 ENCOUNTER — Ambulatory Visit (INDEPENDENT_AMBULATORY_CARE_PROVIDER_SITE_OTHER): Payer: Medicare Other | Admitting: Pulmonary Disease

## 2018-02-03 VITALS — BP 126/72 | HR 55 | Ht 76.0 in | Wt 228.0 lb

## 2018-02-03 DIAGNOSIS — I428 Other cardiomyopathies: Secondary | ICD-10-CM

## 2018-02-03 DIAGNOSIS — R0602 Shortness of breath: Secondary | ICD-10-CM

## 2018-02-03 DIAGNOSIS — R911 Solitary pulmonary nodule: Secondary | ICD-10-CM | POA: Diagnosis not present

## 2018-02-03 NOTE — Progress Notes (Signed)
Synopsis: Referred in Aug 2019 for Dyspnea, he has a history of systolic heart failure.   Subjective:   PATIENT ID: Anthony Pierce GENDER: male DOB: [birth year], MRN: [id number]   HPI  Chief Complaint  Patient presents with  . Follow-up    1-2 week f/u for dyspnea. Had a PFT.    Anthony Pierce says he has been doing well since the last visit.  His breathing has improved with exercise.  He is not feeling significantly short of breath.  He is not coughing up anything right now.  He has not been sick since the last visit.  He is here today to go over the results of his lung function test.   Past Medical History:  Diagnosis Date  . CHF (congestive heart failure) ([place])   . Diverticulosis   . Dysrhythmia    heart palpitations infrequent  . Hemorrhoids   . History of AAA (abdominal aortic aneurysm) repair   . History of kidney stones   . Hyperlipidemia   . Hypertension   . Kidney stones      Review of Systems  Constitutional: Negative for chills, fever, malaise/fatigue and weight loss.  HENT: Negative for congestion, nosebleeds, sinus pain and sore throat.   Eyes: Negative for photophobia, pain and discharge.  Respiratory: Positive for cough, sputum production and shortness of breath. Negative for hemoptysis and wheezing.   Cardiovascular: Negative for chest pain, palpitations, orthopnea and leg swelling.  Gastrointestinal: Negative for abdominal pain, constipation, diarrhea, nausea and vomiting.  Genitourinary: Negative for dysuria, frequency, hematuria and urgency.  Musculoskeletal: Negative for back pain, joint pain, myalgias and neck pain.  Skin: Negative for itching and rash.  Neurological: Negative for tingling, tremors, sensory change, speech change, focal weakness, seizures, weakness and headaches.  Psychiatric/Behavioral: Negative for memory loss, substance abuse and suicidal ideas. The patient is not nervous/anxious.       Objective:  Physical Exam   Vitals:   02/03/18  0903  BP: 126/72  Pulse: (!) 55  SpO2: 95%  Weight: 228 lb (103.4 kg)  Height: 6\' 4"  (1.93 m)    Gen: well appearing HENT: OP clear, TM's clear, neck supple PULM: CTA B, normal percussion CV: RRR, no mgr, trace edema GI: BS+, soft, nontender Derm: no cyanosis or rash Psyche: normal mood and affect    CBC    Component Value Date/Time   WBC 15.7 (H) 07/12/2016 0451   RBC 4.07 (L) 07/12/2016 0451   HGB 12.1 (L) 07/12/2016 0451   HCT 36.3 (L) 07/12/2016 0451   PLT 219 07/12/2016 0451   MCV 89.2 07/12/2016 0451   MCH 29.7 07/12/2016 0451   MCHC 33.3 07/12/2016 0451   RDW 13.9 07/12/2016 0451   LYMPHSABS 1.8 03/26/2016 2102   MONOABS 0.5 03/26/2016 2102   EOSABS 0.0 03/26/2016 2102   BASOSABS 0.0 03/26/2016 2102     Chest imaging: November 2017 CT abdomen pelvis: Lung windows reviewed showing no pulmonary parenchymal abnormality November 2018 CT angiogram > no PE; interstitial edema and small bilateral effusions noted, mild bibasilar atelectasis, small bilateral pulmonary nodules, largest is 11mm  PFT: August 2019, ratio 86%, FEV1 2.75 L 65% predicted, FVC 3.2 L 56% predicted Jan 30, 2018 Ratio 73%, FEV 3.2L (82% pred), FVC 4.3L (84% pred), TLC 7.1L (93% pred) DLCO 23.75mL 65% pred  Labs: 07/2017 Hgb 12.9 mg/dL  Path:  Echo: 04/2017 TTE> LVEF 25-30%, mild LVH, mild MR, diffuse hypokinesis of left ventricul  Heart Catheterization:  Assessment & Plan:   No diagnosis found.  Discussion: His lung function test showed an isolated deficit in diffusion capacity which can be seen with pulmonary vascular or mixed pulmonary parenchymal disease such as centrilobular emphysema with interstitial lung disease.  He needs to have a high-resolution CT scan of his chest because his echocardiogram performed in November 2018 did not show evidence of pulmonary hypertension.  Anemia can also cause a decreased diffusion capacity so we will check a hemoglobin today.  He says his  mother has a history of anemia though he has not had any bleeding.  He refused a flu shot today.  Plan: Shortness of breath with abnormal lung function test: Lung function test was essentially normal with 1 abnormality: Your body's ability to get oxygen from the lungs into the bloodstream.  This can be seen with anemia and lung abnormalities.  We will check into both with a complete blood count and a high-resolution CT scan of your chest.  Systolic heart failure Keep appointment with cardiology Consider a flu shot  Pulmonary nodule with a history of smoking: We will see what the size of the nodule is when we repeat the CT scan above. If it has not changed in size compared to prior then we will make arrangements for you to have lung cancer screening starting September 2020.  We will see you back in one year or sooner if these tests show any abnormalities   Current Outpatient Medications:  .  ALPRAZolam (XANAX) 0.5 MG tablet, TAKE ONE TABLET BY MOUTH TWICE DAILY AS NEEDED ANXIETY. *SPARINGLY USE TO PREVENT DEPENDENCE*, Disp: 30 tablet, Rfl: 0 .  aspirin EC 81 MG tablet, Take 1 tablet (81 mg total) by mouth daily., Disp: 90 tablet, Rfl: 3 .  atorvastatin (LIPITOR) 40 MG tablet, Take 1 tablet (40 mg total) by mouth at bedtime., Disp: 90 tablet, Rfl: 3 .  carvedilol (COREG) 12.5 MG tablet, TAKE ONE TABLET BY MOUTH TWICE DAILY WITH A MEAL, Disp: 60 tablet, Rfl: 1 .  furosemide (LASIX) 20 MG tablet, Take 1 tablet (20 mg total) by mouth daily., Disp: 90 tablet, Rfl: 1 .  ibuprofen (ADVIL,MOTRIN) 200 MG tablet, Take 400 mg by mouth every 6 (six) hours as needed for mild pain., Disp: , Rfl:  .  losartan (COZAAR) 50 MG tablet, Take 1 tablet (50 mg total) by mouth daily., Disp: 90 tablet, Rfl: 1

## 2018-02-03 NOTE — Patient Instructions (Addendum)
Shortness of breath with abnormal lung function test: Lung function test was essentially normal with 1 abnormality: Your body's ability to get oxygen from the lungs into the bloodstream.  This can be seen with anemia and lung abnormalities.  We will check into both with a complete blood count and a high-resolution CT scan of your chest.  Systolic heart failure Keep appointment with cardiology Consider a flu shot  Pulmonary nodule with a history of smoking: We will see what the size of the nodule is when we repeat the CT scan above. If it has not changed in size compared to prior then we will make arrangements for you to have lung cancer screening starting September 2020.  We will see you back in one year or sooner if these tests show any abnormalities

## 2018-02-04 LAB — CBC WITH DIFFERENTIAL/PLATELET
BASOS ABS: 0.1 10*3/uL (ref 0.0–0.2)
Basos: 1 %
EOS (ABSOLUTE): 0.2 10*3/uL (ref 0.0–0.4)
Eos: 3 %
Hematocrit: 44.8 % (ref 37.5–51.0)
Hemoglobin: 15.3 g/dL (ref 13.0–17.7)
IMMATURE GRANS (ABS): 0 10*3/uL (ref 0.0–0.1)
IMMATURE GRANULOCYTES: 0 %
LYMPHS: 32 %
Lymphocytes Absolute: 2.3 10*3/uL (ref 0.7–3.1)
MCH: 31.1 pg (ref 26.6–33.0)
MCHC: 34.2 g/dL (ref 31.5–35.7)
MCV: 91 fL (ref 79–97)
Monocytes Absolute: 0.5 10*3/uL (ref 0.1–0.9)
Monocytes: 7 %
NEUTROS PCT: 57 %
Neutrophils Absolute: 4.1 10*3/uL (ref 1.4–7.0)
PLATELETS: 215 10*3/uL (ref 150–450)
RBC: 4.92 x10E6/uL (ref 4.14–5.80)
RDW: 13.9 % (ref 12.3–15.4)
WBC: 7.3 10*3/uL (ref 3.4–10.8)

## 2018-02-05 ENCOUNTER — Ambulatory Visit (HOSPITAL_BASED_OUTPATIENT_CLINIC_OR_DEPARTMENT_OTHER)
Admission: RE | Admit: 2018-02-05 | Discharge: 2018-02-05 | Disposition: A | Discharge status: Home / Self Care | Payer: Medicare Other | Source: Ambulatory Visit | Attending: Pulmonary Disease | Admitting: Pulmonary Disease

## 2018-02-05 DIAGNOSIS — K8020 Calculus of gallbladder without cholecystitis without obstruction: Secondary | ICD-10-CM | POA: Diagnosis not present

## 2018-02-05 DIAGNOSIS — R918 Other nonspecific abnormal finding of lung field: Secondary | ICD-10-CM | POA: Insufficient documentation

## 2018-02-05 DIAGNOSIS — J439 Emphysema, unspecified: Secondary | ICD-10-CM | POA: Diagnosis not present

## 2018-02-05 DIAGNOSIS — I712 Thoracic aortic aneurysm, without rupture: Secondary | ICD-10-CM | POA: Diagnosis not present

## 2018-02-05 DIAGNOSIS — I700 Atherosclerosis of aorta: Secondary | ICD-10-CM | POA: Insufficient documentation

## 2018-02-05 DIAGNOSIS — J432 Centrilobular emphysema: Secondary | ICD-10-CM | POA: Diagnosis not present

## 2018-02-05 DIAGNOSIS — R911 Solitary pulmonary nodule: Secondary | ICD-10-CM | POA: Diagnosis present

## 2018-02-07 ENCOUNTER — Other Ambulatory Visit: Payer: Self-pay | Admitting: Osteopathic Medicine

## 2018-02-09 ENCOUNTER — Ambulatory Visit (HOSPITAL_COMMUNITY)
Admission: RE | Admit: 2018-02-09 | Discharge: 2018-02-09 | Disposition: A | Discharge status: Home / Self Care | Payer: Medicare Other | Source: Ambulatory Visit | Attending: Surgery | Admitting: Surgery

## 2018-02-09 ENCOUNTER — Ambulatory Visit (INDEPENDENT_AMBULATORY_CARE_PROVIDER_SITE_OTHER): Payer: Medicare Other | Admitting: Surgery

## 2018-02-09 ENCOUNTER — Ambulatory Visit (INDEPENDENT_AMBULATORY_CARE_PROVIDER_SITE_OTHER)
Admission: RE | Admit: 2018-02-09 | Discharge: 2018-02-09 | Disposition: A | Discharge status: Home / Self Care | Payer: Medicare Other | Source: Ambulatory Visit | Attending: Surgery | Admitting: Surgery

## 2018-02-09 ENCOUNTER — Other Ambulatory Visit: Payer: Self-pay

## 2018-02-09 ENCOUNTER — Encounter: Payer: Self-pay | Admitting: Surgery

## 2018-02-09 VITALS — BP 166/96 | HR 51 | Temp 97.0°F | Resp 16 | Ht 76.0 in | Wt 236.0 lb

## 2018-02-09 DIAGNOSIS — I714 Abdominal aortic aneurysm, without rupture, unspecified: Secondary | ICD-10-CM

## 2018-02-09 DIAGNOSIS — I6523 Occlusion and stenosis of bilateral carotid arteries: Secondary | ICD-10-CM | POA: Diagnosis not present

## 2018-02-09 DIAGNOSIS — I6529 Occlusion and stenosis of unspecified carotid artery: Secondary | ICD-10-CM

## 2018-02-09 NOTE — Progress Notes (Signed)
Vitals:   02/09/18 0908 02/09/18 0914  BP: (!) 175/95 (!) 163/100  Pulse: (!) 51 (!) 51  Resp: 16   Temp: (!) 97 F (36.1 C)   TempSrc: Oral   SpO2: 97%   Weight: 236 lb (107 kg)   Height: 6\' 4"  (1.93 m)

## 2018-02-09 NOTE — Progress Notes (Signed)
[facility] [place]  Patient name: Anthony Pierce MRN: [id number] DOB: 1950/07/28 Sex: male   REASON FOR VISIT:    Follow up  HISOTRY OF PRESENT ILLNESS:     Anthony Pierce is a 67 y.o. male who returns today for follow-up.  He is status post endovascular repair of an infrarenal abdominal aortic aneurysm on 03/27/2016.  Prior to his surgery, the patient presented complaining of abdominal pain in his left lower quadrant that radiated to his scrotum.  During his workup a CT scan was negative for diverticulitis but did reveal a left kidney stone.  Because of his persistent pain, the patient had his aneurysm repaired.  Postoperatively he continued to have pain but this resolved with continued antibiotics for possible urinary tract infection.  After his operation he presented to the emergency department on 04/23/2016 with abdominal pain and bright red blood per rectum.  He was diagnosed with diverticulosis.  Since I last saw him, he has been hospitalized for a few days for congestive heart failure.  He is doing well today and has no complaints.   PAST MEDICAL HISTORY:   Past Medical History:  Diagnosis Date  . CHF (congestive heart failure) ([place])   . Diverticulosis   . Dysrhythmia    heart palpitations infrequent  . Hemorrhoids   . History of AAA (abdominal aortic aneurysm) repair   . History of kidney stones   . Hyperlipidemia   . Hypertension   . Kidney stones      FAMILY HISTORY:   Family History  Problem Relation Age of Onset  . Cancer Father        stomach    SOCIAL HISTORY:   Social History   Tobacco Use  . Smoking status: Former Smoker    Types: Cigarettes    Last attempt to quit: 05/02/2017    Years since quitting: 0.7  . Smokeless tobacco: Never Used  . Tobacco comment: 1 pk per day  Substance Use Topics  . Alcohol use: No     ALLERGIES:   Allergies  Allergen Reactions  . Penicillins Rash  and Other (See Comments)    Has patient had a PCN reaction causing immediate rash, facial/tongue/throat swelling, SOB or lightheadedness with hypotension: Yes Has patient had a PCN reaction causing severe rash involving mucus membranes or skin necrosis: No Has patient had a PCN reaction that required hospitalization No Has patient had a PCN reaction occurring within the last 10 years: No If all of the above answers are "NO", then may proceed with Cephalosporin use.       CURRENT MEDICATIONS:   Current Outpatient Medications  Medication Sig Dispense Refill  . ALPRAZolam (XANAX) 0.5 MG tablet TAKE ONE TABLET BY MOUTH TWICE DAILY AS NEEDED ANXIETY. *SPARINGLY USE TO PREVENT DEPENDENCE* 30 tablet 0  . aspirin EC 81 MG tablet Take 1 tablet (81 mg total) by mouth daily. 90 tablet 3  . atorvastatin (LIPITOR) 40 MG tablet Take 1 tablet (40 mg total) by mouth at bedtime. 90 tablet 3  . carvedilol (COREG) 12.5 MG tablet TAKE ONE TABLET BY MOUTH TWICE DAILY WITH A MEAL 60 tablet 1  . furosemide (LASIX) 20 MG tablet Take 1 tablet (20 mg total) by mouth daily. 90 tablet 1  . losartan (COZAAR) 50 MG tablet Take 1 tablet (50 mg total) by mouth daily. 90 tablet 1  . ibuprofen (ADVIL,MOTRIN) 200 MG tablet Take 400 mg by mouth every 6 (six) hours as needed  for mild pain.     No current facility-administered medications for this visit.     REVIEW OF SYSTEMS:   [X]  denotes positive finding, [ ]  denotes negative finding Cardiac  Comments:  Chest pain or chest pressure:    Shortness of breath upon exertion:    Short of breath when lying flat:    Irregular heart rhythm:        Vascular    Pain in calf, thigh, or hip brought on by ambulation:    Pain in feet at night that wakes you up from your sleep:     Blood clot in your veins:    Leg swelling:         Pulmonary    Oxygen at home:    Productive cough:     Wheezing:         Neurologic    Sudden weakness in arms or legs:     Sudden numbness  in arms or legs:     Sudden onset of difficulty speaking or slurred speech:    Temporary loss of vision in one eye:     Problems with dizziness:         Gastrointestinal    Blood in stool:     Vomited blood:         Genitourinary    Burning when urinating:     Blood in urine:        Psychiatric    Major depression:         Hematologic    Bleeding problems:    Problems with blood clotting too easily:        Skin    Rashes or ulcers:        Constitutional    Fever or chills:      PHYSICAL EXAM:   Vitals:   02/09/18 0908 02/09/18 0914 02/09/18 0915  BP: (!) 175/95 (!) 163/100 (!) 166/96  Pulse: (!) 51 (!) 51 (!) 51  Resp: 16    Temp: (!) 97 F (36.1 C)    TempSrc: Oral    SpO2: 97%    Weight: 236 lb (107 kg)    Height: 6\' 4"  (1.93 m)      GENERAL: The patient is a well-nourished male, in no acute distress. The vital signs are documented above. CARDIAC: There is a regular rate and rhythm.  VASCULAR: No carotid bruits PULMONARY: Non-labored respirations ABDOMEN: Soft and non-tender with normal pitched bowel sounds.  MUSCULOSKELETAL: There are no major deformities or cyanosis. NEUROLOGIC: No focal weakness or paresthesias are detected. SKIN: There are no ulcers or rashes noted. PSYCHIATRIC: The patient has a normal affect.  STUDIES:   I have ordered and reviewed the vascular lab studies with the following findings: Carotid: 1-39% bilateral stenosis AAA: Maximum diameter is 4.3 cm.  No endoleak  MEDICAL ISSUES:   AAA: He will be placed on annual surveillance ultrasound    Annamarie Major, MD [facility] [phone] Pager [phone]

## 2018-02-23 ENCOUNTER — Other Ambulatory Visit: Payer: Self-pay | Admitting: Osteopathic Medicine

## 2018-03-11 ENCOUNTER — Other Ambulatory Visit: Payer: Self-pay | Admitting: Osteopathic Medicine

## 2018-03-11 NOTE — Telephone Encounter (Signed)
Please review for refill for alprazolam 0.5 mg tab.  Pt seen at [facility] by Emeterio Reeve.

## 2018-03-24 ENCOUNTER — Other Ambulatory Visit: Payer: Self-pay | Admitting: Osteopathic Medicine

## 2018-04-01 ENCOUNTER — Other Ambulatory Visit: Payer: Self-pay

## 2018-04-01 MED ORDER — LOSARTAN POTASSIUM 50 MG PO TABS: 50.0000 mg | ORAL_TABLET | Freq: Every day | ORAL | 0 refills | Status: DC

## 2018-04-07 ENCOUNTER — Telehealth: Payer: Self-pay | Admitting: Osteopathic Medicine

## 2018-04-07 NOTE — Telephone Encounter (Signed)
Looks like CT already scheduled

## 2018-04-07 NOTE — Telephone Encounter (Signed)
-----   Message from Emeterio Reeve, DO sent at 11/19/2017 10:18 AM EDT ----- Regarding: lung nodule  Follow up lung nodule

## 2018-04-10 ENCOUNTER — Ambulatory Visit (HOSPITAL_BASED_OUTPATIENT_CLINIC_OR_DEPARTMENT_OTHER)
Admission: RE | Admit: 2018-04-10 | Discharge: 2018-04-10 | Disposition: A | Discharge status: Home / Self Care | Payer: Medicare Other | Source: Ambulatory Visit | Attending: Pulmonary Disease | Admitting: Pulmonary Disease

## 2018-04-10 DIAGNOSIS — I712 Thoracic aortic aneurysm, without rupture: Secondary | ICD-10-CM | POA: Insufficient documentation

## 2018-04-10 DIAGNOSIS — J439 Emphysema, unspecified: Secondary | ICD-10-CM | POA: Insufficient documentation

## 2018-04-10 DIAGNOSIS — K8020 Calculus of gallbladder without cholecystitis without obstruction: Secondary | ICD-10-CM | POA: Diagnosis not present

## 2018-04-10 DIAGNOSIS — R0602 Shortness of breath: Secondary | ICD-10-CM | POA: Insufficient documentation

## 2018-04-10 DIAGNOSIS — R918 Other nonspecific abnormal finding of lung field: Secondary | ICD-10-CM | POA: Diagnosis not present

## 2018-04-16 ENCOUNTER — Encounter: Payer: Self-pay | Admitting: Osteopathic Medicine

## 2018-04-16 ENCOUNTER — Ambulatory Visit (INDEPENDENT_AMBULATORY_CARE_PROVIDER_SITE_OTHER): Payer: Medicare Other | Admitting: Osteopathic Medicine

## 2018-04-16 VITALS — BP 171/113 | HR 53 | Temp 98.1°F | Wt 235.4 lb

## 2018-04-16 DIAGNOSIS — I6529 Occlusion and stenosis of unspecified carotid artery: Secondary | ICD-10-CM | POA: Diagnosis not present

## 2018-04-16 DIAGNOSIS — I10 Essential (primary) hypertension: Secondary | ICD-10-CM | POA: Diagnosis not present

## 2018-04-16 DIAGNOSIS — H6123 Impacted cerumen, bilateral: Secondary | ICD-10-CM

## 2018-04-16 NOTE — Progress Notes (Signed)
HPI: Anthony Pierce is a 67 y.o. male who  has a past medical history of CHF (congestive heart failure) ([place]), Diverticulosis, Dysrhythmia, Hemorrhoids, History of AAA (abdominal aortic aneurysm) repair, History of kidney stones, Hyperlipidemia, Hypertension, and Kidney stones.  he presents to [facility] today, 04/16/18,  for chief complaint of:  Ear pain  . Location: R ear . Quality: sharp  . Duration: 2-3 days . Modifying factors: tried to flush it out himself but this seemed to make worse  . Assoc signs/symptoms: no fever/chills, no sore throat, no URI symptoms     Past medical history, surgical history, and family history reviewed.  Current medication list and allergy/intolerance information reviewed.   (See remainder of HPI, ROS, Phys Exam below)     ASSESSMENT/PLAN:   Bilateral impacted cerumen - Plan: Ear Lavage  Essential hypertension      Follow-up plan: Return in about 1 week (around 04/23/2018) for nurse visit - recheck BP and check home monitor .                      ############################################ ############################################ ############################################ ############################################    Outpatient Encounter Medications as of 04/16/2018  Medication Sig  . ALPRAZolam (XANAX) 0.5 MG tablet TAKE ONE TABLET BY MOUTH TWICE DAILY AS NEEDED ANXIETY. *USE SPARINGLY TO PREVENT DEPENDENCE*  . aspirin EC 81 MG tablet Take 1 tablet (81 mg total) by mouth daily.  Marland Kitchen atorvastatin (LIPITOR) 40 MG tablet Take 1 tablet (40 mg total) by mouth at bedtime.  . carvedilol (COREG) 12.5 MG tablet Take 1 tablet (12.5 mg total) by mouth 2 (two) times daily with a meal. Pt needs follow up appt w/Provider for refills.  . furosemide (LASIX) 20 MG tablet TAKE ONE TABLET BY MOUTH DAILY  . ibuprofen (ADVIL,MOTRIN) 200 MG tablet Take 400 mg by mouth every 6 (six) hours as  needed for mild pain.  Marland Kitchen losartan (COZAAR) 50 MG tablet Take 1 tablet (50 mg total) by mouth daily.   No facility-administered encounter medications on file as of 04/16/2018.    Allergies  Allergen Reactions  . Penicillins Rash and Other (See Comments)    Has patient had a PCN reaction causing immediate rash, facial/tongue/throat swelling, SOB or lightheadedness with hypotension: Yes Has patient had a PCN reaction causing severe rash involving mucus membranes or skin necrosis: No Has patient had a PCN reaction that required hospitalization No Has patient had a PCN reaction occurring within the last 10 years: No If all of the above answers are "NO", then may proceed with Cephalosporin use.        Review of Systems:  Constitutional: No recent illness  HEENT: No  headache, no vision change  Cardiac: No  chest pain, No  pressure, No palpitations  Respiratory:  No  shortness of breath. No  Cough  Musculoskeletal: No new myalgia/arthralgia  Skin: No  Rash  Neurologic: No  weakness, No  Dizziness   Exam:  BP (!) 168/96 (BP Location: Left Arm, Patient Position: Sitting, Cuff Size: Normal)   Pulse (!) 56   Temp 98.1 F (36.7 C) (Oral)   Wt 235 lb 6.4 oz (106.8 kg)   BMI 28.65 kg/m   Constitutional: VS see above. General Appearance: alert, well-developed, well-nourished, NAD  Eyes: Normal lids and conjunctive, non-icteric sclera  Ears, Nose, Mouth, Throat: MMM, Normal external inspection ears/nares/mouth/lips/gums.  After evacuation of cerumen, TM clear, canals a bit irritated, worse on R but no apparent  infection   Neck: No masses, trachea midline.   Respiratory: Normal respiratory effort. no wheeze, no rhonchi, no rales  Cardiovascular: S1/S2 normal, no murmur, no rub/gallop auscultated. RRR.   Musculoskeletal: Gait normal. Symmetric and independent movement of all extremities  Neurological: Normal balance/coordination. No tremor.  Skin: warm, dry, intact.    Psychiatric: Normal judgment/insight. Normal mood and affect. Oriented x3.   Visit summary with medication list and pertinent instructions was printed for patient to review, advised to alert [place] if any changes needed. All questions at time of visit were answered - patient instructed to contact office with any additional concerns. ER/RTC precautions were reviewed with the patient and understanding verbalized.   Follow-up plan: Return in about 1 week (around 04/23/2018) for nurse visit - recheck BP and check home monitor .  Note: Total time spent 25 minutes, greater than 50% of the visit was spent face-to-face counseling and coordinating care for the following: The primary encounter diagnosis was Bilateral impacted cerumen. A diagnosis of Essential hypertension was also pertinent to this visit.Marland Kitchen  Please note: voice recognition software was used to produce this document, and typos may escape review. Please contact Dr. Sheppard Coil for any needed clarifications.

## 2018-04-24 ENCOUNTER — Ambulatory Visit (INDEPENDENT_AMBULATORY_CARE_PROVIDER_SITE_OTHER): Payer: Medicare Other | Admitting: Osteopathic Medicine

## 2018-04-24 VITALS — BP 142/73 | HR 55

## 2018-04-24 DIAGNOSIS — I6529 Occlusion and stenosis of unspecified carotid artery: Secondary | ICD-10-CM

## 2018-04-24 DIAGNOSIS — I10 Essential (primary) hypertension: Secondary | ICD-10-CM | POA: Diagnosis not present

## 2018-04-24 NOTE — Progress Notes (Signed)
Budde pressure here actually looks pretty good, patient's home monitor not consistent or really accurate, I think we can just go by our read here.  He seems to have a bit more whitecoat hypertension when he is actually here for a visit with physician, will monitor.

## 2018-04-24 NOTE — Progress Notes (Signed)
Pt came into clinic today for BP check. He reports taking all of this medications as prescribed, took them around 0730 today. He does report having 3-4 cups of coffee between 0600-0800. He brought in his home readings, which were not in goal range. His BP today in office was within goal range. We did check his machine with ours, it is not accurate. Advised Pt to go get a new BP machine, he reports his current machine is "very old" so he is "not surprised." Readings from today's visit were:  Our machine:  Vitals:   04/24/18 1000 04/24/18 1010  BP: 129/79 (!) 142/73  Pulse: (!) 55 (!) 55  His home machine: 138/89, pulse 57. 137/93, pulse 57.   Per PCP, no Rx changes. Pt will get new BP machine and come back in 2-3 weeks for NV recheck with home machine.

## 2018-04-29 ENCOUNTER — Other Ambulatory Visit: Payer: Self-pay | Admitting: Osteopathic Medicine

## 2018-04-29 NOTE — Telephone Encounter (Signed)
Please review for refill- patient at [facility] 

## 2018-05-08 ENCOUNTER — Ambulatory Visit (INDEPENDENT_AMBULATORY_CARE_PROVIDER_SITE_OTHER): Payer: Medicare Other | Admitting: Osteopathic Medicine

## 2018-05-08 VITALS — BP 146/79 | HR 51 | Temp 97.4°F | Wt 236.0 lb

## 2018-05-08 DIAGNOSIS — I10 Essential (primary) hypertension: Secondary | ICD-10-CM

## 2018-05-08 DIAGNOSIS — I6529 Occlusion and stenosis of unspecified carotid artery: Secondary | ICD-10-CM

## 2018-05-08 NOTE — Progress Notes (Signed)
Pt in today for BP check.His BP today was 146/79 and pulse 51. Pt did buy a new machine but does not have it with him. Spoke with provider and she advised him to continue with current medications and to follow up in 3 month. Advised pt to continue to check BP at home and keep journal, advised if BP starts running higher to call back sooner for a nurse visit.Pt expressed understanding.

## 2018-05-09 ENCOUNTER — Other Ambulatory Visit: Payer: Self-pay | Admitting: Osteopathic Medicine

## 2018-05-11 NOTE — Telephone Encounter (Signed)
Requesting RF on Xanax  Last RX sent 03-12-18 for #30 with 1 RF  RX pended, please send if appropriate

## 2018-05-12 ENCOUNTER — Telehealth: Payer: Self-pay

## 2018-05-12 NOTE — Telephone Encounter (Signed)
Opened in error

## 2018-05-14 ENCOUNTER — Other Ambulatory Visit: Payer: Self-pay | Admitting: Osteopathic Medicine

## 2018-05-15 ENCOUNTER — Other Ambulatory Visit: Payer: Self-pay

## 2018-05-15 MED ORDER — CARVEDILOL 12.5 MG PO TABS: 12.5000 mg | ORAL_TABLET | Freq: Two times a day (BID) | ORAL | 3 refills | Status: DC

## 2018-05-21 ENCOUNTER — Other Ambulatory Visit: Payer: Self-pay | Admitting: Osteopathic Medicine

## 2018-05-21 NOTE — Telephone Encounter (Signed)
Please review for refill- patient at [facility] 

## 2018-06-15 ENCOUNTER — Other Ambulatory Visit: Payer: Self-pay | Admitting: Family Medicine

## 2018-06-15 ENCOUNTER — Other Ambulatory Visit: Payer: Self-pay | Admitting: Osteopathic Medicine

## 2018-06-15 DIAGNOSIS — I10 Essential (primary) hypertension: Secondary | ICD-10-CM | POA: Diagnosis not present

## 2018-06-15 DIAGNOSIS — I447 Left bundle-branch block, unspecified: Secondary | ICD-10-CM | POA: Diagnosis not present

## 2018-06-15 DIAGNOSIS — I428 Other cardiomyopathies: Secondary | ICD-10-CM | POA: Diagnosis not present

## 2018-06-15 DIAGNOSIS — I714 Abdominal aortic aneurysm, without rupture: Secondary | ICD-10-CM | POA: Diagnosis not present

## 2018-07-10 DIAGNOSIS — I428 Other cardiomyopathies: Secondary | ICD-10-CM | POA: Diagnosis not present

## 2018-07-21 DIAGNOSIS — I10 Essential (primary) hypertension: Secondary | ICD-10-CM | POA: Diagnosis not present

## 2018-07-21 DIAGNOSIS — I447 Left bundle-branch block, unspecified: Secondary | ICD-10-CM | POA: Diagnosis not present

## 2018-07-21 DIAGNOSIS — I428 Other cardiomyopathies: Secondary | ICD-10-CM | POA: Diagnosis not present

## 2018-07-21 DIAGNOSIS — I5022 Chronic systolic (congestive) heart failure: Secondary | ICD-10-CM | POA: Diagnosis not present

## 2018-08-07 ENCOUNTER — Encounter: Payer: Self-pay | Admitting: Osteopathic Medicine

## 2018-08-07 ENCOUNTER — Ambulatory Visit (INDEPENDENT_AMBULATORY_CARE_PROVIDER_SITE_OTHER): Payer: Medicare Other | Admitting: Osteopathic Medicine

## 2018-08-07 VITALS — BP 165/92 | HR 109 | Temp 98.0°F | Wt 233.0 lb

## 2018-08-07 DIAGNOSIS — I428 Other cardiomyopathies: Secondary | ICD-10-CM | POA: Diagnosis not present

## 2018-08-07 DIAGNOSIS — I10 Essential (primary) hypertension: Secondary | ICD-10-CM | POA: Diagnosis not present

## 2018-08-07 DIAGNOSIS — I5022 Chronic systolic (congestive) heart failure: Secondary | ICD-10-CM | POA: Diagnosis not present

## 2018-08-07 MED ORDER — AMLODIPINE BESYLATE 5 MG PO TABS: 5.0000 mg | ORAL_TABLET | Freq: Every day | ORAL | 0 refills | Status: DC

## 2018-08-07 MED ORDER — LOSARTAN POTASSIUM 100 MG PO TABS: 100.0000 mg | ORAL_TABLET | Freq: Every day | ORAL | 3 refills | Status: DC

## 2018-08-07 NOTE — Patient Instructions (Addendum)
Starting amlodipine aka norvasc Follow up with [facility] as directed!

## 2018-08-07 NOTE — Progress Notes (Signed)
HPI: Anthony Pierce is a 68 y.o. male who  has a past medical history of CHF (congestive heart failure) ([place]), Diverticulosis, Dysrhythmia, Hemorrhoids, History of AAA (abdominal aortic aneurysm) repair, History of kidney stones, Hyperlipidemia, Hypertension, and Kidney stones.  he presents to [facility] today, 08/07/18,  for chief complaint of:  HTN  165/92 today, similar on 2 rechecks. Reports cardiology recently increased Losartan to 100 mg - last visit 07/21/2018, at that visit BP was 160/92, advised recheck BMP in 3 weeks and f/u in 6 weeks, if BP still elevated recommended adding Norvasc/Imdur.  I don't see results from repeat BMP, last results 07/10/2018 Cr at baseline and Ca++ slightly decreased to 8.4, Na+ and K+ ok.   BP Readings from Last 3 Encounters:  08/07/18 (!) 165/92  05/08/18 (!) 146/79  04/24/18 (!) 142/73   Has appt w/ cardio 09/01/18       At today's visit 08/07/18 ... PMH, PSH, FH reviewed and updated as needed.  Current medication list and allergy/intolerance hx reviewed and updated as needed. (See remainder of HPI, ROS, Phys Exam below)         ASSESSMENT/PLAN: The encounter diagnosis was Essential hypertension.    Meds ordered this encounter  Medications  . losartan (COZAAR) 100 MG tablet    Sig: Take 1 tablet (100 mg total) by mouth daily.    Dispense:  90 tablet    Refill:  3  . amLODipine (NORVASC) 5 MG tablet    Sig: Take 1 tablet (5 mg total) by mouth daily.    Dispense:  90 tablet    Refill:  0    Patient Instructions  Starting amlodipine aka norvasc Follow up with [facility] as directed!       Follow-up plan: Return in about 6 months (around 02/07/2019) for [place] DR A - sooner if needed!  .                                                 ################################################# ################################################# ################################################# #################################################    Current Meds  Medication Sig  . ALPRAZolam (XANAX) 0.5 MG tablet TAKE 1 TABLET BY MOUTH TWICE DAILY AS NEEDED FOR ANXIETY  . aspirin EC 81 MG tablet Take 1 tablet (81 mg total) by mouth daily.  Marland Kitchen atorvastatin (LIPITOR) 40 MG tablet TAKE 1 TABLET(40 MG) BY MOUTH DAILY  . carvedilol (COREG) 12.5 MG tablet Take 1 tablet (12.5 mg total) by mouth 2 (two) times daily with a meal.  . furosemide (LASIX) 20 MG tablet TAKE ONE TABLET BY MOUTH DAILY  . ibuprofen (ADVIL,MOTRIN) 200 MG tablet Take 400 mg by mouth every 6 (six) hours as needed for mild pain.  Marland Kitchen losartan (COZAAR) 100 MG tablet Take 1 tablet (100 mg total) by mouth daily.  . [DISCONTINUED] losartan (COZAAR) 50 MG tablet Take 1 tablet (50 mg total) by mouth daily.    Allergies  Allergen Reactions  . Penicillins Rash and Other (See Comments)    Has patient had a PCN reaction causing immediate rash, facial/tongue/throat swelling, SOB or lightheadedness with hypotension: Yes Has patient had a PCN reaction causing severe rash involving mucus membranes or skin necrosis: No Has patient had a PCN reaction that required hospitalization No Has patient had a PCN reaction occurring within the last 10 years: No  If all of the above answers are "NO", then may proceed with Cephalosporin use.   Unknown reaction  Has patient had a PCN reaction causing immediate rash, facial/tongue/throat swelling, SOB or lightheadedness with hypotension: Yes Has patient had a PCN reaction causing severe rash involving mucus membranes or skin necrosis: No Has patient had a PCN reaction that required hospitalization No Has patient had a PCN reaction occurring within the  last 10 years: No If all of the above answers are "NO", then may proceed with Cephalosporin use.       Review of Systems:  Constitutional: No recent illness  HEENT: No  headache, no vision change  Cardiac: No  chest pain, No  pressure, No palpitations  Respiratory:  No  shortness of breath. No  Cough  Gastrointestinal: No  abdominal pain  Exam:  BP (!) 165/92 (BP Location: Left Arm, Patient Position: Sitting, Cuff Size: Normal)   Pulse (!) 109   Temp 98 F (36.7 C) (Oral)   Wt 233 lb (105.7 kg)   BMI 28.36 kg/m   Constitutional: VS see above. General Appearance: alert, well-developed, well-nourished, NAD  Eyes: Normal lids and conjunctive, non-icteric sclera  Ears, Nose, Mouth, Throat: MMM, Normal external inspection ears/nares/mouth/lips/gums.  Neck: No masses, trachea midline.   Respiratory: Normal respiratory effort. no wheeze, no rhonchi, no rales  Cardiovascular: S1/S2 normal, no murmur, no rub/gallop auscultated. RRR.   Musculoskeletal: Gait normal. Symmetric and independent movement of all extremities  Neurological: Normal balance/coordination. No tremor.  Skin: warm, dry, intact.   Psychiatric: Normal judgment/insight. Normal mood and affect. Oriented x3.       Visit summary with medication list and pertinent instructions was printed for patient to review, patient was advised to alert [place] if any updates are needed. All questions at time of visit were answered - patient instructed to contact office with any additional concerns. ER/RTC precautions were reviewed with the patient and understanding verbalized.    Please note: voice recognition software was used to produce this document, and typos may escape review. Please contact Dr. Sheppard Coil for any needed clarifications.    Follow up plan: Return in about 6 months (around 02/07/2019) for [facility] DR A - sooner if needed! Marland Kitchen

## 2018-09-01 DIAGNOSIS — I714 Abdominal aortic aneurysm, without rupture: Secondary | ICD-10-CM | POA: Diagnosis not present

## 2018-09-01 DIAGNOSIS — I447 Left bundle-branch block, unspecified: Secondary | ICD-10-CM | POA: Diagnosis not present

## 2018-09-01 DIAGNOSIS — I5022 Chronic systolic (congestive) heart failure: Secondary | ICD-10-CM | POA: Diagnosis not present

## 2018-09-01 DIAGNOSIS — I428 Other cardiomyopathies: Secondary | ICD-10-CM | POA: Diagnosis not present

## 2018-09-01 DIAGNOSIS — I10 Essential (primary) hypertension: Secondary | ICD-10-CM | POA: Diagnosis not present

## 2018-09-10 ENCOUNTER — Other Ambulatory Visit: Payer: Self-pay | Admitting: Osteopathic Medicine

## 2018-09-29 DIAGNOSIS — I5022 Chronic systolic (congestive) heart failure: Secondary | ICD-10-CM | POA: Diagnosis not present

## 2018-09-29 DIAGNOSIS — I10 Essential (primary) hypertension: Secondary | ICD-10-CM | POA: Diagnosis not present

## 2018-09-29 DIAGNOSIS — I447 Left bundle-branch block, unspecified: Secondary | ICD-10-CM | POA: Diagnosis not present

## 2018-09-29 DIAGNOSIS — I428 Other cardiomyopathies: Secondary | ICD-10-CM | POA: Diagnosis not present

## 2018-10-12 ENCOUNTER — Other Ambulatory Visit: Payer: Self-pay | Admitting: Osteopathic Medicine

## 2018-12-15 ENCOUNTER — Other Ambulatory Visit: Payer: Self-pay | Admitting: Osteopathic Medicine

## 2019-01-01 DIAGNOSIS — I5022 Chronic systolic (congestive) heart failure: Secondary | ICD-10-CM | POA: Diagnosis not present

## 2019-01-01 DIAGNOSIS — I519 Heart disease, unspecified: Secondary | ICD-10-CM | POA: Diagnosis not present

## 2019-01-01 DIAGNOSIS — I10 Essential (primary) hypertension: Secondary | ICD-10-CM | POA: Diagnosis not present

## 2019-01-01 DIAGNOSIS — I517 Cardiomegaly: Secondary | ICD-10-CM | POA: Diagnosis not present

## 2019-01-01 DIAGNOSIS — I2510 Atherosclerotic heart disease of native coronary artery without angina pectoris: Secondary | ICD-10-CM | POA: Diagnosis not present

## 2019-01-01 DIAGNOSIS — I447 Left bundle-branch block, unspecified: Secondary | ICD-10-CM | POA: Diagnosis not present

## 2019-01-01 DIAGNOSIS — I77810 Thoracic aortic ectasia: Secondary | ICD-10-CM | POA: Diagnosis not present

## 2019-01-01 DIAGNOSIS — I5189 Other ill-defined heart diseases: Secondary | ICD-10-CM | POA: Diagnosis not present

## 2019-01-01 DIAGNOSIS — I428 Other cardiomyopathies: Secondary | ICD-10-CM | POA: Diagnosis not present

## 2019-01-01 DIAGNOSIS — I712 Thoracic aortic aneurysm, without rupture: Secondary | ICD-10-CM | POA: Diagnosis not present

## 2019-01-12 DIAGNOSIS — R918 Other nonspecific abnormal finding of lung field: Secondary | ICD-10-CM | POA: Diagnosis not present

## 2019-01-12 DIAGNOSIS — I712 Thoracic aortic aneurysm, without rupture: Secondary | ICD-10-CM | POA: Diagnosis not present

## 2019-01-12 DIAGNOSIS — I428 Other cardiomyopathies: Secondary | ICD-10-CM | POA: Diagnosis not present

## 2019-01-15 ENCOUNTER — Other Ambulatory Visit: Payer: Self-pay | Admitting: Osteopathic Medicine

## 2019-02-05 DIAGNOSIS — I10 Essential (primary) hypertension: Secondary | ICD-10-CM | POA: Diagnosis not present

## 2019-02-05 DIAGNOSIS — I2510 Atherosclerotic heart disease of native coronary artery without angina pectoris: Secondary | ICD-10-CM | POA: Diagnosis not present

## 2019-02-05 DIAGNOSIS — I5022 Chronic systolic (congestive) heart failure: Secondary | ICD-10-CM | POA: Diagnosis not present

## 2019-02-05 DIAGNOSIS — I428 Other cardiomyopathies: Secondary | ICD-10-CM | POA: Diagnosis not present

## 2019-02-05 DIAGNOSIS — I447 Left bundle-branch block, unspecified: Secondary | ICD-10-CM | POA: Diagnosis not present

## 2019-02-05 DIAGNOSIS — N289 Disorder of kidney and ureter, unspecified: Secondary | ICD-10-CM | POA: Diagnosis not present

## 2019-02-09 ENCOUNTER — Encounter: Payer: Self-pay | Admitting: Osteopathic Medicine

## 2019-02-09 ENCOUNTER — Ambulatory Visit (INDEPENDENT_AMBULATORY_CARE_PROVIDER_SITE_OTHER): Payer: Medicare Other | Admitting: Osteopathic Medicine

## 2019-02-09 ENCOUNTER — Other Ambulatory Visit: Payer: Self-pay

## 2019-02-09 VITALS — BP 132/72 | HR 62 | Temp 97.8°F | Wt 234.5 lb

## 2019-02-09 DIAGNOSIS — Z125 Encounter for screening for malignant neoplasm of prostate: Secondary | ICD-10-CM | POA: Diagnosis not present

## 2019-02-09 DIAGNOSIS — Z0000 Encounter for general adult medical examination without abnormal findings: Secondary | ICD-10-CM | POA: Diagnosis not present

## 2019-02-09 DIAGNOSIS — Z1211 Encounter for screening for malignant neoplasm of colon: Secondary | ICD-10-CM

## 2019-02-09 DIAGNOSIS — I10 Essential (primary) hypertension: Secondary | ICD-10-CM

## 2019-02-09 NOTE — Patient Instructions (Signed)
General Preventive Care  Most recent routine screening lipids/other labs: ordered today .   Tobacco: don't!   Alcohol: responsible moderation is ok for most adults - if you have concerns about your alcohol intake, please talk to me!   Exercise: as tolerated to reduce risk of cardiovascular disease and diabetes. Strength training will also prevent osteoporosis.   Mental health: if need for mental health care (medicines, counseling, other), or concerns about moods, please let me know!   Sexual health: if need for STD testing, or if concerns with libido/pain problems, please let me know!   Advanced Directive: Living Will and/or Healthcare Power of Attorney recommended for all adults, regardless of age or health.  Vaccines  Flu vaccine: recommended for almost everyone, every fall.   Shingles vaccine: Shingrix recommended after age 78.   Pneumonia vaccines: Prevnar and Pneumovax recommended after age 82, or sooner if certain medical conditions.  Tetanus booster: Tdap recommended every 10 years. Due 2029 Cancer screenings   Colon cancer screening: refer GI  Prostate cancer screening: PSA blood test age 47-71  Lung cancer screening: Ct scan annually  Infection screenings . HIV, Gonorrhea/Chlamydia: screening as needed. . Hepatitis C: recommendedonce for anyone born 39-1963 . TB: certain at-risk populations, or depending on work requirements and/or travel history Other . Bone Density Test: recommended for men at age 48 . Abdominal Aortic Aneurysm:follow-up as directed by cardiology/vascular team

## 2019-02-09 NOTE — Progress Notes (Signed)
HPI: Anthony Pierce is a 68 y.o. male  who presents to [facility] today, 02/09/19,  for Medicare Annual Wellness Exam  Patient presents for annual physical/Medicare wellness exam. No complaints today.  Bp improved on recheck/ Intake was 160/82, went down to 132/72. Had recent discussion w/ cardiology re: implantation of defibrillator device, planning on having this procedure.    Past medical, surgical, social and family history reviewed:  Patient Active Problem List   Diagnosis Date Noted  . CKD (chronic kidney disease) stage 3, GFR 30-59 ml/min (HCC) 11/19/2017  . Situational anxiety 05/08/2017  . LBBB (left bundle branch block) 04/15/2017  . Nonischemic cardiomyopathy ([place]) 04/15/2017  . Chronic systolic congestive heart failure ([place]) 04/14/2017  . Essential hypertension 04/14/2017  . Nephrolithiasis 07/11/2016  . AAA (abdominal aortic aneurysm) ([place]) 03/27/2016  . Tobacco abuse 07/09/2012    Past Surgical History:  Procedure Laterality Date  . ABDOMINAL AORTIC ENDOVASCULAR STENT GRAFT Bilateral 03/27/2016   Procedure: ABDOMINAL AORTIC ENDOVASCULAR STENT GRAFT;  Surgeon: Serafina Mitchell, MD;  Location: [facility];  Service: Vascular;  Laterality: Bilateral;  . COLONSCOPY  AGE 29  . NEPHROLITHOTOMY Left 07/11/2016   Procedure: NEPHROLITHOTOMY PERCUTANEOUS WITH SURGEON ACESS;  Surgeon: Ardis Hughs, MD;  Location: [facility];  Service: Urology;  Laterality: Left;    Social History   Socioeconomic History  . Marital status: Legally Separated    Spouse name: Not on file  . Number of children: Not on file  . Years of education: Not on file  . Highest education level: Not on file  Occupational History  . Not on file  Social Needs  . Financial resource strain: Not on file  . Food insecurity    Worry: Not on file    Inability: Not on file  . Transportation needs    Medical: Not on file    Non-medical: Not on file  Tobacco Use  . Smoking  status: Former Smoker    Types: Cigarettes    Quit date: 05/02/2017    Years since quitting: 1.7  . Smokeless tobacco: Never Used  . Tobacco comment: 1 pk per day  Substance and Sexual Activity  . Alcohol use: No  . Drug use: No  . Sexual activity: Never  Lifestyle  . Physical activity    Days per week: Not on file    Minutes per session: Not on file  . Stress: Not on file  Relationships  . Social Herbalist on phone: Not on file    Gets together: Not on file    Attends religious service: Not on file    Active member of club or organization: Not on file    Attends meetings of clubs or organizations: Not on file    Relationship status: Not on file  . Intimate partner violence    Fear of current or ex partner: Not on file    Emotionally abused: Not on file    Physically abused: Not on file    Forced sexual activity: Not on file  Other Topics Concern  . Not on file  Social History Narrative  . Not on file    Family History  Problem Relation Age of Onset  . Cancer Father        stomach     Current medication list and allergy/intolerance information reviewed:    Outpatient Encounter Medications as of 02/09/2019  Medication Sig  . ALPRAZolam (XANAX) 0.5 MG tablet TAKE 1  TABLET BY MOUTH TWICE DAILY AS NEEDED FOR ANXIETY  . amLODipine (NORVASC) 5 MG tablet Take 1 tablet (5 mg total) by mouth daily.  Marland Kitchen aspirin EC 81 MG tablet Take 1 tablet (81 mg total) by mouth daily.  Marland Kitchen atorvastatin (LIPITOR) 40 MG tablet TAKE 1 TABLET(40 MG) BY MOUTH DAILY  . carvedilol (COREG) 12.5 MG tablet Take 1 tablet (12.5 mg total) by mouth 2 (two) times daily with a meal.  . furosemide (LASIX) 20 MG tablet TAKE ONE TABLET BY MOUTH DAILY  . ibuprofen (ADVIL,MOTRIN) 200 MG tablet Take 400 mg by mouth every 6 (six) hours as needed for mild pain.  Marland Kitchen losartan (COZAAR) 100 MG tablet Take 1 tablet (100 mg total) by mouth daily.   No facility-administered encounter medications on file as of  02/09/2019.     Allergies  Allergen Reactions  . Penicillins Rash and Other (See Comments)    Has patient had a PCN reaction causing immediate rash, facial/tongue/throat swelling, SOB or lightheadedness with hypotension: Yes Has patient had a PCN reaction causing severe rash involving mucus membranes or skin necrosis: No Has patient had a PCN reaction that required hospitalization No Has patient had a PCN reaction occurring within the last 10 years: No If all of the above answers are "NO", then may proceed with Cephalosporin use.   Unknown reaction  Has patient had a PCN reaction causing immediate rash, facial/tongue/throat swelling, SOB or lightheadedness with hypotension: Yes Has patient had a PCN reaction causing severe rash involving mucus membranes or skin necrosis: No Has patient had a PCN reaction that required hospitalization No Has patient had a PCN reaction occurring within the last 10 years: No If all of the above answers are "NO", then may proceed with Cephalosporin use.       Review of Systems: Review of Systems - General ROS: negative Psychological ROS: negative Respiratory ROS: no cough, shortness of breath, or wheezing Cardiovascular ROS: no chest pain or dyspnea on exertion Gastrointestinal ROS: no abdominal pain, change in bowel habits, or black or bloody stools Musculoskeletal ROS: negative Neurological ROS: no TIA or stroke symptoms   Medicare Wellness Questionnaire  Are there smokers in your home (other than you)? no  Depression Screen (Note: if answer to either of the following is "Yes", a more complete depression screening is indicated)   Q1: Over the past two weeks, have you felt down, depressed or hopeless? no  Q2: Over the past two weeks, have you felt little interest or pleasure in doing things? no  Have you lost interest or pleasure in daily life? no  Do you often feel hopeless? no  Do you cry easily over simple problems? no  Activities of Daily  Living In your present state of health, do you have any difficulty performing the following activities?:  Driving? no Managing money?  no Feeding yourself? no Getting from bed to chair? no Climbing a flight of stairs? no Preparing food and eating?: no Bathing or showering? no Getting dressed: no Getting to the toilet? no Using the toilet: no Moving around from place to place: no In the past year have you fallen or had a near fall?: no  Hearing Difficulties:  Do you often ask people to speak up or repeat themselves? no Do you experience ringing or noises in your ears? no  Do you have difficulty understanding soft or whispered voices? no  Memory Difficulties:  Do you feel that you have a problem with memory? no  Do you  often misplace items? no  Do you feel safe at home?  yes  Sexual Health:   Are you sexually active?  Yes  Do you have more than one partner?  No  Advanced Directives:   Advanced directives discussed: has NO advanced directive  - add't info requested. Referral to SW: no  Additional information provided: yes  Risk Factors  Current exercise habits: active but not much intentional exercise  Dietary issues discussed:no concerns  Cardiac risk factors: known cardiac disease   Exam:  BP 132/72 (BP Location: Left Arm, Patient Position: Sitting, Cuff Size: Normal)   Pulse 62   Temp 97.8 F (36.6 C) (Oral)   Wt 234 lb 8 oz (106.4 kg)   BMI 28.54 kg/m  Vision by Snellen chart: right eye:see nurse notes, left eye:see nurse notes  Constitutional: VS see above. General Appearance: alert, well-developed, well-nourished, NAD  Ears, Nose, Mouth, Throat: MMM  Neck: No masses, trachea midline.   Respiratory: Normal respiratory effort. no wheeze, no rhonchi, no rales  Cardiovascular:No lower extremity edema.   Musculoskeletal: Gait normal. No clubbing/cyanosis of digits.   Neurological: Normal balance/coordination. No tremor. Recalls 3 objects and able to read  face of watch with correct time.   Skin: warm, dry, intact. No rash/ulcer.   Psychiatric: Normal judgment/insight. Normal mood and affect. Oriented x3.     ASSESSMENT/PLAN:   Medicare annual wellness visit, subsequent  Essential hypertension - Plan: CBC, COMPLETE METABOLIC PANEL WITH GFR, Lipid panel  Prostate cancer screening - Plan: PSA, Total with Reflex to PSA, Free  CANCER SCREENING  Lung - USPSTF: 55-80yo w/ 30 py hx unless quit w/in 52yr - needs   Colon - needs  Prostate - needs  Breast - does not need  Cervical - does not need OTHER DISEASE SCREENING  Lipid - needs  DM2 - needs  AAA - male 35-75yo ever smoked - does not need - following w/ cardiology   Osteoporosis - women 65yo+, men 66yo+ - does not need INFECTIOUS DISEASE SCREENING  HIV - does not need  GC/CT - does not need  HepC -does not need - declined  TB - does not need ADULT VACCINATION  Influenza - annual vaccine recommended  Td - booster every 10 years - does not need  Zoster - option at 50, yes at 57+ - needs  PCV13 - does not need  PPSV23 - needs Immunization History  Administered Date(s) Administered  . Tdap 11/19/2017   OTHER  Fall - exercise and Vit D age 34+ - does not need  Advanced Directives -  Discussed as above   During the course of the visit the patient was educated and counseled about appropriate screening and preventive services as noted above.   Patient Instructions (the written plan) was given to the patient.  Medicare Attestation I have personally reviewed: The patient's medical and social history Their use of alcohol, tobacco or illicit drugs Their current medications and supplements The patient's functional ability including ADLs,fall risks, home safety risks, cognitive, and hearing and visual impairment Diet and physical activities Evidence for depression or mood disorders  The patient's weight, height, BMI, and visual acuity have been recorded in  the chart.  I have made referrals, counseling, and provided education to the patient based on review of the above and I have provided the patient with a written personalized care plan for preventive services.     Emeterio Reeve, DO   02/09/19   Visit summary with medication list and  pertinent instructions was printed for patient to review. All questions at time of visit were answered - patient instructed to contact office with any additional concerns. ER/RTC precautions were reviewed with the patient. Follow-up plan: No follow-ups on file.

## 2019-02-10 MED ORDER — ZOSTER VAC RECOMB ADJUVANTED 50 MCG/0.5ML IM SUSR: 0.5000 mL | Freq: Once | INTRAMUSCULAR | 1 refills | Status: AC

## 2019-02-17 ENCOUNTER — Other Ambulatory Visit: Payer: Self-pay | Admitting: Family Medicine

## 2019-03-03 DIAGNOSIS — Z125 Encounter for screening for malignant neoplasm of prostate: Secondary | ICD-10-CM | POA: Diagnosis not present

## 2019-03-03 DIAGNOSIS — I10 Essential (primary) hypertension: Secondary | ICD-10-CM | POA: Diagnosis not present

## 2019-03-04 LAB — CBC
HCT: 45.3 % (ref 38.5–50.0)
Hemoglobin: 15 g/dL (ref 13.2–17.1)
MCH: 31 pg (ref 27.0–33.0)
MCHC: 33.1 g/dL (ref 32.0–36.0)
MCV: 93.6 fL (ref 80.0–100.0)
MPV: 10.6 fL (ref 7.5–12.5)
Platelets: 242 10*3/uL (ref 140–400)
RBC: 4.84 10*6/uL (ref 4.20–5.80)
RDW: 13.1 % (ref 11.0–15.0)
WBC: 6.7 10*3/uL (ref 3.8–10.8)

## 2019-03-04 LAB — COMPLETE METABOLIC PANEL WITH GFR
AG Ratio: 1.5 (calc) (ref 1.0–2.5)
ALT: 11 U/L (ref 9–46)
AST: 13 U/L (ref 10–35)
Albumin: 3.9 g/dL (ref 3.6–5.1)
Alkaline phosphatase (APISO): 61 U/L (ref 35–144)
BUN/Creatinine Ratio: 19 (calc) (ref 6–22)
BUN: 27 mg/dL — ABNORMAL HIGH (ref 7–25)
CO2: 25 mmol/L (ref 20–32)
Calcium: 8.6 mg/dL (ref 8.6–10.3)
Chloride: 107 mmol/L (ref 98–110)
Creat: 1.43 mg/dL — ABNORMAL HIGH (ref 0.70–1.25)
GFR, Est African American: 59 mL/min/{1.73_m2} — ABNORMAL LOW (ref >=60)
GFR, Est Non African American: 51 mL/min/{1.73_m2} — ABNORMAL LOW (ref >=60)
Globulin: 2.6 g/dL (calc) (ref 1.9–3.7)
Glucose, Bld: 98 mg/dL (ref 65–99)
Potassium: 4.1 mmol/L (ref 3.5–5.3)
Sodium: 138 mmol/L (ref 135–146)
Total Bilirubin: 0.7 mg/dL (ref 0.2–1.2)
Total Protein: 6.5 g/dL (ref 6.1–8.1)

## 2019-03-04 LAB — LIPID PANEL
Cholesterol: 123 mg/dL (ref <200)
HDL: 42 mg/dL (ref >=40)
LDL Cholesterol (Calc): 64 mg/dL (calc)
Non-HDL Cholesterol (Calc): 81 mg/dL (calc) (ref <130)
Total CHOL/HDL Ratio: 2.9 (calc) (ref <5.0)
Triglycerides: 83 mg/dL (ref <150)

## 2019-03-04 LAB — PSA, TOTAL WITH REFLEX TO PSA, FREE: PSA, Total: 2.6 ng/mL (ref <=4.0)

## 2019-03-19 ENCOUNTER — Encounter: Payer: Self-pay | Admitting: Osteopathic Medicine

## 2019-04-09 ENCOUNTER — Telehealth: Payer: Self-pay

## 2019-04-09 DIAGNOSIS — R9431 Abnormal electrocardiogram [ECG] [EKG]: Secondary | ICD-10-CM | POA: Diagnosis not present

## 2019-04-09 DIAGNOSIS — H532 Diplopia: Secondary | ICD-10-CM | POA: Diagnosis not present

## 2019-04-09 DIAGNOSIS — F17210 Nicotine dependence, cigarettes, uncomplicated: Secondary | ICD-10-CM | POA: Diagnosis not present

## 2019-04-09 DIAGNOSIS — Z79899 Other long term (current) drug therapy: Secondary | ICD-10-CM | POA: Diagnosis not present

## 2019-04-09 DIAGNOSIS — Z880 Allergy status to penicillin: Secondary | ICD-10-CM | POA: Diagnosis not present

## 2019-04-09 DIAGNOSIS — Z7982 Long term (current) use of aspirin: Secondary | ICD-10-CM | POA: Diagnosis not present

## 2019-04-09 DIAGNOSIS — I509 Heart failure, unspecified: Secondary | ICD-10-CM | POA: Diagnosis not present

## 2019-04-09 DIAGNOSIS — I110 Hypertensive heart disease with heart failure: Secondary | ICD-10-CM | POA: Diagnosis not present

## 2019-04-09 NOTE — Telephone Encounter (Signed)
Noted, would agree that ER was the right call.  Anthony Pierce's voicemail greeting does state that for medical emergencies 9 on 1/emergency room is recommended.  ER should instruct patient on follow-up

## 2019-04-09 NOTE — Telephone Encounter (Signed)
Pt left a vm msg earlier today stating he think he was having a mini stroke. As per pt, woke up with double vision and left arm numbness. It appears pt went to the ER later that morning. Attempted to contact pt to see how they were feeling after ER visit. No answer, left a vm msg to schedule an appt with provider for evaluation of symptoms. Aware to go to UC/ER if symptoms should return or worsen. Direct call back info provided.

## 2019-04-13 ENCOUNTER — Ambulatory Visit (INDEPENDENT_AMBULATORY_CARE_PROVIDER_SITE_OTHER): Payer: Medicare Other

## 2019-04-13 ENCOUNTER — Encounter: Payer: Self-pay | Admitting: Osteopathic Medicine

## 2019-04-13 ENCOUNTER — Ambulatory Visit (INDEPENDENT_AMBULATORY_CARE_PROVIDER_SITE_OTHER): Payer: Medicare Other | Admitting: Osteopathic Medicine

## 2019-04-13 ENCOUNTER — Other Ambulatory Visit: Payer: Self-pay

## 2019-04-13 ENCOUNTER — Telehealth (INDEPENDENT_AMBULATORY_CARE_PROVIDER_SITE_OTHER): Payer: Medicare Other | Admitting: Adult Health

## 2019-04-13 VITALS — BP 137/82 | HR 60 | Temp 97.7°F | Wt 234.1 lb

## 2019-04-13 DIAGNOSIS — G459 Transient cerebral ischemic attack, unspecified: Secondary | ICD-10-CM | POA: Diagnosis not present

## 2019-04-13 DIAGNOSIS — I639 Cerebral infarction, unspecified: Secondary | ICD-10-CM | POA: Diagnosis not present

## 2019-04-13 MED ORDER — ASPIRIN EC 325 MG PO TBEC: 325.0000 mg | DELAYED_RELEASE_TABLET | Freq: Every day | ORAL | 0 refills | Status: DC

## 2019-04-13 NOTE — Progress Notes (Signed)
HPI: Anthony Pierce is a 68 y.o. male who  has a past medical history of CHF (congestive heart failure) ([place]), Diverticulosis, Dysrhythmia, Hemorrhoids, History of AAA (abdominal aortic aneurysm) repair, History of kidney stones, Hyperlipidemia, Hypertension, and Kidney stones.  he presents to [facility] today, 04/13/19,  for chief complaint of:  ER follow-up  ER notes reviewed.  Patient was seen in ED and discharged same day, 04/09/2019.  Presented to ER with complaint of stroke symptoms.  Double vision with uncoordination of the left eye, and left arm weakness/uncoordination starting that morning around 06:30 for about 30 minutes with spontaneous resolution.  Blood pressure was elevated to 188/99 at 9:55 AM ED triage.  CT head showed mild motion artifact, no acute intracranial abnormality, small right mastoid effusion, mild ethmoid sinus disease.  CT angio head/neck showed no extracranial carotid artery stenosis, both vertebral arteries patent, no large vessel intracranial stenosis or occlusion, no aneurysm.  Anthony Pierce declined MRI and neurology referral though this was offered and encouraged by ED physician according to their documentation. He was instructed to follow-up with PCP and he is here today!    Patient confirms the above history with regard to feeling a bit out of control of his arm and having double vision which resolved spontaneously.  He wanted to check with me first before getting an MRI but he is amenable to getting one at this time.  He has had no additional concerning episodes since his discharge from the ED.        At today's visit 04/13/19 ... PMH, PSH, FH reviewed and updated as needed.  Current medication list and allergy/intolerance hx reviewed and updated as needed. (See remainder of HPI, ROS, Phys Exam below)   No results found.  No results found for this or any previous visit (from the past 72  hour(s)).        ASSESSMENT/PLAN: The encounter diagnosis was TIA (transient ischemic attack).   Orders Placed This Encounter  Procedures  . MR Brain W Wo Contrast     Meds ordered this encounter  Medications  . aspirin EC 325 MG tablet    Sig: Take 1 tablet (325 mg total) by mouth daily.    Dispense:  30 tablet    Refill:  0   Patient is on high intensity statin, would strongly consider increasing to atorvastatin 80 or possibly Crestor.  Advised patient that we really need to get blood pressure measures under control.  We do not really have an accurate list for him right now, will work on updating this in the chart and cooperating with Dr. Tyson/cardiology, patient would like to check with him before changing blood pressure medications  Patient Instructions  I think you probably had a TIA/mini stroke.  Let us go ahead and get an MRI.  Based on those results, we might adjust her medicines to include high-dose aspirin plus or minus another mild blood thinning medication to help prevent another stroke.  I sent higher dose aspirin into the pharmacy for you, I can hold the 81 mg/baby aspirin for now.  I will reach out to your cardiologist to see what we can do to get your blood pressure under better control.  We might adjust medications based on what they tell me.  Otherwise, let us wait until after your MRI of the brain and go from there!       Follow-up plan: Return for RECHECK PENDING MRI RESULTS / IF WORSE OR  CHANGE.                                                 ################################################# ################################################# ################################################# #################################################    Current Meds  Medication Sig  . amLODipine (NORVASC) 5 MG tablet Take 1 tablet (5 mg total) by mouth daily.  Marland Kitchen aspirin EC 81 MG tablet Take 1 tablet (81 mg total)  by mouth daily.  Marland Kitchen atorvastatin (LIPITOR) 40 MG tablet TAKE 1 TABLET(40 MG) BY MOUTH DAILY  . carvedilol (COREG) 12.5 MG tablet Take 1 tablet (12.5 mg total) by mouth 2 (two) times daily with a meal.  . furosemide (LASIX) 20 MG tablet TAKE ONE TABLET BY MOUTH DAILY  . ibuprofen (ADVIL,MOTRIN) 200 MG tablet Take 400 mg by mouth every 6 (six) hours as needed for mild pain.  Marland Kitchen losartan (COZAAR) 100 MG tablet Take 1 tablet (100 mg total) by mouth daily.    Allergies  Allergen Reactions  . Penicillins Rash and Other (See Comments)    Has patient had a PCN reaction causing immediate rash, facial/tongue/throat swelling, SOB or lightheadedness with hypotension: Yes Has patient had a PCN reaction causing severe rash involving mucus membranes or skin necrosis: No Has patient had a PCN reaction that required hospitalization No Has patient had a PCN reaction occurring within the last 10 years: No If all of the above answers are "NO", then may proceed with Cephalosporin use.   Unknown reaction  Has patient had a PCN reaction causing immediate rash, facial/tongue/throat swelling, SOB or lightheadedness with hypotension: Yes Has patient had a PCN reaction causing severe rash involving mucus membranes or skin necrosis: No Has patient had a PCN reaction that required hospitalization No Has patient had a PCN reaction occurring within the last 10 years: No If all of the above answers are "NO", then may proceed with Cephalosporin use.       Review of Systems:  Constitutional: No recent illness exept per HPI  HEENT: No  headache, no vision change  Cardiac: No  chest pain, No  pressure, No palpitations  Respiratory:  No  shortness of breath. No  Cough  Gastrointestinal: No  abdominal pain, no change on bowel habits  Musculoskeletal: No new myalgia/arthralgia  Neurologic: No  weakness, No  Dizziness  Psychiatric: No  concerns with depression, No  concerns with anxiety   Exam:  BP 137/82 (BP  Location: Left Arm, Patient Position: Sitting, Cuff Size: Normal)   Pulse 60   Temp 97.7 F (36.5 C) (Oral)   Wt 234 lb 1.3 oz (106.2 kg)   BMI 28.49 kg/m   BP Readings from Last 3 Encounters:  04/13/19 137/82  02/09/19 132/72  08/07/18 (!) 165/92    Constitutional: VS see above. General Appearance: alert, well-developed, well-nourished, NAD  Eyes: Normal lids and conjunctive, non-icteric sclera  Ears, Nose, Mouth, Throat: MMM, Normal external inspection ears/nares/mouth/lips/gums.  Neck: No masses, trachea midline.   Respiratory: Normal respiratory effort. no wheeze, no rhonchi, no rales  Cardiovascular: S1/S2 normal, no murmur, no rub/gallop auscultated. RRR.   Musculoskeletal: Gait normal. Symmetric and independent movement of all extremities  Neurological: Normal balance/coordination. No tremor.  Skin: warm, dry, intact.   Psychiatric: Normal judgment/insight. Normal mood and affect. Oriented x3.       Visit summary with medication list and pertinent instructions was printed for patient to review,  patient was advised to alert [place] if any updates are needed. All questions at time of visit were answered - patient instructed to contact office with any additional concerns. ER/RTC precautions were reviewed with the patient and understanding verbalized.   Note: Total time spent 25 minutes, greater than 50% of the visit was spent face-to-face counseling and coordinating care for the following: The encounter diagnosis was TIA (transient ischemic attack)..  Please note: voice recognition software was used to produce this document, and typos may escape review. Please contact Dr. Sheppard Coil for any needed clarifications.    Follow up plan: Return for RECHECK PENDING MRI RESULTS / IF WORSE OR CHANGE.

## 2019-04-13 NOTE — Patient Instructions (Addendum)
I think you probably had a TIA/mini stroke.  Let us go ahead and get an MRI.  Based on those results, we might adjust her medicines to include high-dose aspirin plus or minus another mild blood thinning medication to help prevent another stroke.  I sent higher dose aspirin into the pharmacy for you, I can hold the 81 mg/baby aspirin for now.  I will reach out to your cardiologist to see what we can do to get your blood pressure under better control.  We might adjust medications based on what they tell me.  Otherwise, let us wait until after your MRI of the brain and go from there!

## 2019-04-14 ENCOUNTER — Other Ambulatory Visit: Payer: Self-pay | Admitting: Osteopathic Medicine

## 2019-04-14 MED ORDER — CARVEDILOL 25 MG PO TABS: 25.0000 mg | ORAL_TABLET | Freq: Two times a day (BID) | ORAL | 1 refills | Status: DC

## 2019-04-14 MED ORDER — CLOPIDOGREL BISULFATE 75 MG PO TABS: 75.0000 mg | ORAL_TABLET | Freq: Every day | ORAL | 1 refills | Status: DC

## 2019-04-14 NOTE — Telephone Encounter (Signed)
MRI results received  Attempted to call pt twice- answer BP at OV well controlled, ASA increased Message sent to pt's PCP

## 2019-04-16 ENCOUNTER — Other Ambulatory Visit: Payer: Self-pay | Admitting: Osteopathic Medicine

## 2019-04-16 MED ORDER — ASPIRIN 81 MG PO TBEC: 325.0000 mg | DELAYED_RELEASE_TABLET | Freq: Every day | ORAL | 3 refills | Status: DC

## 2019-04-19 ENCOUNTER — Telehealth: Payer: Self-pay

## 2019-04-19 NOTE — Telephone Encounter (Signed)
[facility] pharmacy requesting med refill for alprazolam. Written by historical provider.

## 2019-04-20 DIAGNOSIS — I110 Hypertensive heart disease with heart failure: Secondary | ICD-10-CM | POA: Diagnosis not present

## 2019-04-20 DIAGNOSIS — Z8673 Personal history of transient ischemic attack (TIA), and cerebral infarction without residual deficits: Secondary | ICD-10-CM | POA: Diagnosis not present

## 2019-04-20 DIAGNOSIS — R001 Bradycardia, unspecified: Secondary | ICD-10-CM | POA: Diagnosis not present

## 2019-04-20 DIAGNOSIS — I2510 Atherosclerotic heart disease of native coronary artery without angina pectoris: Secondary | ICD-10-CM | POA: Diagnosis not present

## 2019-04-20 DIAGNOSIS — I5022 Chronic systolic (congestive) heart failure: Secondary | ICD-10-CM | POA: Diagnosis not present

## 2019-04-20 DIAGNOSIS — Z7982 Long term (current) use of aspirin: Secondary | ICD-10-CM | POA: Diagnosis not present

## 2019-04-20 DIAGNOSIS — N289 Disorder of kidney and ureter, unspecified: Secondary | ICD-10-CM | POA: Diagnosis not present

## 2019-04-20 DIAGNOSIS — Z20828 Contact with and (suspected) exposure to other viral communicable diseases: Secondary | ICD-10-CM | POA: Diagnosis not present

## 2019-04-20 DIAGNOSIS — I428 Other cardiomyopathies: Secondary | ICD-10-CM | POA: Diagnosis not present

## 2019-04-20 DIAGNOSIS — Z880 Allergy status to penicillin: Secondary | ICD-10-CM | POA: Diagnosis not present

## 2019-04-20 DIAGNOSIS — I447 Left bundle-branch block, unspecified: Secondary | ICD-10-CM | POA: Diagnosis not present

## 2019-04-20 DIAGNOSIS — Z7902 Long term (current) use of antithrombotics/antiplatelets: Secondary | ICD-10-CM | POA: Diagnosis not present

## 2019-04-20 DIAGNOSIS — Z79899 Other long term (current) drug therapy: Secondary | ICD-10-CM | POA: Diagnosis not present

## 2019-04-20 MED ORDER — ALPRAZOLAM 0.5 MG PO TABS: 0.5000 mg | ORAL_TABLET | Freq: Two times a day (BID) | ORAL | 0 refills | Status: DC | PRN

## 2019-04-23 DIAGNOSIS — I428 Other cardiomyopathies: Secondary | ICD-10-CM | POA: Diagnosis not present

## 2019-04-23 DIAGNOSIS — Z20828 Contact with and (suspected) exposure to other viral communicable diseases: Secondary | ICD-10-CM | POA: Diagnosis not present

## 2019-04-23 DIAGNOSIS — Z006 Encounter for examination for normal comparison and control in clinical research program: Secondary | ICD-10-CM | POA: Diagnosis not present

## 2019-04-23 DIAGNOSIS — I5022 Chronic systolic (congestive) heart failure: Secondary | ICD-10-CM | POA: Diagnosis not present

## 2019-04-23 DIAGNOSIS — I447 Left bundle-branch block, unspecified: Secondary | ICD-10-CM | POA: Diagnosis not present

## 2019-04-23 DIAGNOSIS — I110 Hypertensive heart disease with heart failure: Secondary | ICD-10-CM | POA: Diagnosis not present

## 2019-04-23 DIAGNOSIS — I2510 Atherosclerotic heart disease of native coronary artery without angina pectoris: Secondary | ICD-10-CM | POA: Diagnosis not present

## 2019-04-23 DIAGNOSIS — I255 Ischemic cardiomyopathy: Secondary | ICD-10-CM | POA: Diagnosis not present

## 2019-04-24 DIAGNOSIS — Z20828 Contact with and (suspected) exposure to other viral communicable diseases: Secondary | ICD-10-CM | POA: Diagnosis not present

## 2019-04-24 DIAGNOSIS — I428 Other cardiomyopathies: Secondary | ICD-10-CM | POA: Diagnosis not present

## 2019-04-24 DIAGNOSIS — R918 Other nonspecific abnormal finding of lung field: Secondary | ICD-10-CM | POA: Diagnosis not present

## 2019-04-24 DIAGNOSIS — I5022 Chronic systolic (congestive) heart failure: Secondary | ICD-10-CM | POA: Diagnosis not present

## 2019-04-24 DIAGNOSIS — I110 Hypertensive heart disease with heart failure: Secondary | ICD-10-CM | POA: Diagnosis not present

## 2019-04-24 DIAGNOSIS — Z950 Presence of cardiac pacemaker: Secondary | ICD-10-CM | POA: Diagnosis not present

## 2019-04-24 DIAGNOSIS — I447 Left bundle-branch block, unspecified: Secondary | ICD-10-CM | POA: Diagnosis not present

## 2019-04-24 DIAGNOSIS — I2510 Atherosclerotic heart disease of native coronary artery without angina pectoris: Secondary | ICD-10-CM | POA: Diagnosis not present

## 2019-05-06 ENCOUNTER — Telehealth: Payer: Self-pay

## 2019-05-06 NOTE — Telephone Encounter (Signed)
Thanks for the update

## 2019-05-06 NOTE — Telephone Encounter (Signed)
Pt left a vm msg requesting a follow up appt with provider for his surgical procedure. As per pt, he had a Defibrillator placed and wants to check in with Dr. Sheppard Coil. Pls contact pt for scheduling. Thanks.

## 2019-05-06 NOTE — Telephone Encounter (Signed)
Patient was scheduled for 12/22

## 2019-05-21 ENCOUNTER — Telehealth: Payer: Self-pay

## 2019-05-21 NOTE — Telephone Encounter (Signed)
[facility] pharmacy requesting med refills for aspirin 325 mg. Rx not listed in active med list.

## 2019-05-25 ENCOUNTER — Other Ambulatory Visit: Payer: Self-pay

## 2019-05-25 ENCOUNTER — Ambulatory Visit (INDEPENDENT_AMBULATORY_CARE_PROVIDER_SITE_OTHER): Payer: Medicare Other | Admitting: Osteopathic Medicine

## 2019-05-25 ENCOUNTER — Encounter: Payer: Self-pay | Admitting: Osteopathic Medicine

## 2019-05-25 VITALS — BP 132/82 | HR 69 | Temp 97.8°F | Wt 233.1 lb

## 2019-05-25 DIAGNOSIS — I447 Left bundle-branch block, unspecified: Secondary | ICD-10-CM | POA: Diagnosis not present

## 2019-05-25 DIAGNOSIS — I10 Essential (primary) hypertension: Secondary | ICD-10-CM | POA: Diagnosis not present

## 2019-05-25 DIAGNOSIS — Z8673 Personal history of transient ischemic attack (TIA), and cerebral infarction without residual deficits: Secondary | ICD-10-CM

## 2019-05-25 DIAGNOSIS — Z95810 Presence of automatic (implantable) cardiac defibrillator: Secondary | ICD-10-CM | POA: Diagnosis not present

## 2019-05-25 DIAGNOSIS — Z0000 Encounter for general adult medical examination without abnormal findings: Secondary | ICD-10-CM | POA: Diagnosis not present

## 2019-05-25 MED ORDER — CLOPIDOGREL BISULFATE 75 MG PO TABS: 75.0000 mg | ORAL_TABLET | Freq: Every day | ORAL | 3 refills | Status: DC

## 2019-05-25 MED ORDER — ALPRAZOLAM 0.5 MG PO TABS: 0.5000 mg | ORAL_TABLET | Freq: Two times a day (BID) | ORAL | 0 refills | Status: DC | PRN

## 2019-05-25 MED ORDER — CARVEDILOL 25 MG PO TABS: 25.0000 mg | ORAL_TABLET | Freq: Two times a day (BID) | ORAL | 1 refills | Status: DC

## 2019-05-25 NOTE — Progress Notes (Signed)
HPI: Anthony Pierce is a 68 y.o. male who  has a past medical history of CHF (congestive heart failure) ([place]), Diverticulosis, Dysrhythmia, Hemorrhoids, History of AAA (abdominal aortic aneurysm) repair, History of kidney stones, Hyperlipidemia, Hypertension, and Kidney stones.  he presents to [facility] today, 05/25/19,  for chief complaint of:  Follow-up from cardiology Fatigue  Recently had defibrillator placed, no complications  Patient reports feeling a bit more energetic since the defibrillator placement.  He blames some residual fatigue on "I think I'm just lazy lately"   No additional TIA symptoms Has been on DAPT >21 days at this time   BP Readings from Last 3 Encounters:  05/25/19 132/82  04/13/19 137/82  02/09/19 132/72      At today's visit 05/25/19 ... PMH, PSH, FH reviewed and updated as needed.  Current medication list and allergy/intolerance hx reviewed and updated as needed. (See remainder of HPI, ROS, Phys Exam below)   No results found.  No results found for this or any previous visit (from the past 72 hour(s)).        ASSESSMENT/PLAN: The primary encounter diagnosis was History of cardiac defibrillator placement. Diagnoses of Annual physical exam, Essential hypertension, LBBB (left bundle branch block), and History of transient ischemic attack (TIA) were also pertinent to this visit.  We will stop dual antiplatelet therapy, ABCD2 risk 4 = moderate, has been on DAPT for greater than 21 days we will go with just Plavix  Orders Placed This Encounter  Procedures  . CBC  . COMPLETE METABOLIC PANEL WITH GFR  . LIPID SCREENING     Meds ordered this encounter  Medications  . ALPRAZolam (XANAX) 0.5 MG tablet    Sig: Take 1 tablet (0.5 mg total) by mouth 2 (two) times daily as needed for anxiety. Sparing use to prevent tolerance/dependence    Dispense:  30 tablet    Refill:  0  . clopidogrel (PLAVIX) 75 MG tablet   Sig: Take 1 tablet (75 mg total) by mouth daily.    Dispense:  90 tablet    Refill:  3      Follow-up plan: Return in about 6 months (around 11/23/2019) for [place] (get labs prior to visit, orders are in).                                                 ################################################# ################################################# ################################################# #################################################    Current Meds  Medication Sig  . ALPRAZolam (XANAX) 0.5 MG tablet Take 1 tablet (0.5 mg total) by mouth 2 (two) times daily as needed for anxiety. Sparing use to prevent tolerance/dependence  . atorvastatin (LIPITOR) 40 MG tablet TAKE 1 TABLET(40 MG) BY MOUTH DAILY  . carvedilol (COREG) 25 MG tablet Take 1 tablet (25 mg total) by mouth 2 (two) times daily with a meal.  . clopidogrel (PLAVIX) 75 MG tablet Take 1 tablet (75 mg total) by mouth daily.  . hydrALAZINE (APRESOLINE) 100 MG tablet   . ibuprofen (ADVIL,MOTRIN) 200 MG tablet Take 400 mg by mouth every 6 (six) hours as needed for mild pain.  . isosorbide mononitrate (IMDUR) 30 MG 24 hr tablet Take 30 mg by mouth daily.  Marland Kitchen losartan (COZAAR) 100 MG tablet Take 1 tablet (100 mg total) by mouth daily.  . [DISCONTINUED] ALPRAZolam (XANAX) 0.5 MG tablet Take 1 tablet (0.5  mg total) by mouth 2 (two) times daily as needed for anxiety. Sparing use to prevent tolerance/dependence  . [DISCONTINUED] aspirin EC 81 MG EC tablet Take 4 tablets (325 mg total) by mouth daily.  . [DISCONTINUED] clopidogrel (PLAVIX) 75 MG tablet Take 1 tablet (75 mg total) by mouth daily.    Allergies  Allergen Reactions  . Penicillins Rash and Other (See Comments)    Has patient had a PCN reaction causing immediate rash, facial/tongue/throat swelling, SOB or lightheadedness with hypotension: Yes Has patient had a PCN reaction causing severe rash involving mucus  membranes or skin necrosis: No Has patient had a PCN reaction that required hospitalization No Has patient had a PCN reaction occurring within the last 10 years: No If all of the above answers are "NO", then may proceed with Cephalosporin use.   Unknown reaction  Has patient had a PCN reaction causing immediate rash, facial/tongue/throat swelling, SOB or lightheadedness with hypotension: Yes Has patient had a PCN reaction causing severe rash involving mucus membranes or skin necrosis: No Has patient had a PCN reaction that required hospitalization No Has patient had a PCN reaction occurring within the last 10 years: No If all of the above answers are "NO", then may proceed with Cephalosporin use.       Review of Systems:  Constitutional: No recent illness  HEENT: No  headache, no vision change  Cardiac: No  chest pain, No  pressure, No palpitations  Respiratory:  No  shortness of breath. No  Cough  Musculoskeletal: No new myalgia/arthralgia  Skin: No  Rash, incision healing well   Neurologic: No  weakness, No  Dizziness  Psychiatric: No  concerns with depression, No  concerns with anxiety  Exam:  BP 132/82 (BP Location: Left Arm, Patient Position: Sitting, Cuff Size: Normal)   Pulse 69   Temp 97.8 F (36.6 C) (Oral)   Wt 233 lb 1.3 oz (105.7 kg)   BMI 28.37 kg/m   Constitutional: VS see above. General Appearance: alert, well-developed, well-nourished, NAD  Eyes: Normal lids and conjunctive, non-icteric sclera  Ears, Nose, Mouth, Throat: MMM, Normal external inspection ears/nares/mouth/lips/gums.  Neck: No masses, trachea midline.   Respiratory: Normal respiratory effort. no wheeze, no rhonchi, no rales  Cardiovascular: S1/S2 normal, no murmur, no rub/gallop auscultated. RRR.   Musculoskeletal: Gait normal. Symmetric and independent movement of all extremities  Neurological: Normal balance/coordination. No tremor.  Skin: warm, dry, intact.   Psychiatric:  Normal judgment/insight. Normal mood and affect. Oriented x3.       Visit summary with medication list and pertinent instructions was printed for patient to review, patient was advised to alert [place] if any updates are needed. All questions at time of visit were answered - patient instructed to contact office with any additional concerns. ER/RTC precautions were reviewed with the patient and understanding verbalized.    Please note: voice recognition software was used to produce this document, and typos may escape review. Please contact Dr. Sheppard Coil for any needed clarifications.    Follow up plan: Return in about 6 months (around 11/23/2019) for [place] (get labs prior to visit, orders are in).

## 2019-05-26 NOTE — Telephone Encounter (Signed)
See progress not from most recent visit

## 2019-06-03 ENCOUNTER — Other Ambulatory Visit: Payer: Self-pay | Admitting: Osteopathic Medicine

## 2019-06-25 ENCOUNTER — Telehealth: Payer: Self-pay

## 2019-06-25 NOTE — Telephone Encounter (Signed)
[facility] pharmacy requesting med refills for alprazolam.

## 2019-06-29 ENCOUNTER — Telehealth: Payer: Self-pay

## 2019-06-29 MED ORDER — ALPRAZOLAM 0.5 MG PO TABS: 0.5000 mg | ORAL_TABLET | Freq: Two times a day (BID) | ORAL | 0 refills | Status: DC | PRN

## 2019-06-29 NOTE — Telephone Encounter (Signed)
2 nd request -   Pt is requesting a med refill request for alprazolam. Pls send to [place].

## 2019-06-29 NOTE — Telephone Encounter (Signed)
sent 

## 2019-06-30 NOTE — Telephone Encounter (Signed)
Pt has been updated regarding med refill sent to local pharmacy. No other inquiries during call.

## 2019-07-01 NOTE — Telephone Encounter (Signed)
Task completed. Pt has been updated of med refill sent to [facility]. No other inquiries during call.

## 2019-07-30 ENCOUNTER — Telehealth: Payer: Self-pay

## 2019-07-30 NOTE — Telephone Encounter (Signed)
[facility] pharmacy requesting med refills for alprazolam.

## 2019-07-31 MED ORDER — ALPRAZOLAM 0.5 MG PO TABS: 0.5000 mg | ORAL_TABLET | Freq: Two times a day (BID) | ORAL | 2 refills | Status: DC | PRN

## 2019-08-08 DIAGNOSIS — I428 Other cardiomyopathies: Secondary | ICD-10-CM | POA: Diagnosis not present

## 2019-08-09 ENCOUNTER — Ambulatory Visit: Payer: Medicare Other | Admitting: Osteopathic Medicine

## 2019-09-10 DIAGNOSIS — N289 Disorder of kidney and ureter, unspecified: Secondary | ICD-10-CM | POA: Diagnosis not present

## 2019-09-10 DIAGNOSIS — I447 Left bundle-branch block, unspecified: Secondary | ICD-10-CM | POA: Diagnosis not present

## 2019-09-10 DIAGNOSIS — I5022 Chronic systolic (congestive) heart failure: Secondary | ICD-10-CM | POA: Diagnosis not present

## 2019-09-10 DIAGNOSIS — I2510 Atherosclerotic heart disease of native coronary artery without angina pectoris: Secondary | ICD-10-CM | POA: Diagnosis not present

## 2019-09-10 DIAGNOSIS — I428 Other cardiomyopathies: Secondary | ICD-10-CM | POA: Diagnosis not present

## 2019-09-10 DIAGNOSIS — Z95810 Presence of automatic (implantable) cardiac defibrillator: Secondary | ICD-10-CM | POA: Diagnosis not present

## 2019-09-10 DIAGNOSIS — I10 Essential (primary) hypertension: Secondary | ICD-10-CM | POA: Diagnosis not present

## 2019-10-28 ENCOUNTER — Other Ambulatory Visit: Payer: Self-pay

## 2019-10-28 NOTE — Telephone Encounter (Signed)
Pt called requesting med refill for alprazolam. Last written on 07/31/19. Rx pended.

## 2019-10-29 MED ORDER — ALPRAZOLAM 0.5 MG PO TABS: 0.5000 mg | ORAL_TABLET | Freq: Two times a day (BID) | ORAL | 2 refills | Status: DC | PRN

## 2019-11-02 DEATH — deceased

## 2019-11-05 ENCOUNTER — Other Ambulatory Visit: Payer: Self-pay | Admitting: *Deleted

## 2019-11-05 DIAGNOSIS — I714 Abdominal aortic aneurysm, without rupture, unspecified: Secondary | ICD-10-CM

## 2019-11-08 DIAGNOSIS — I429 Cardiomyopathy, unspecified: Secondary | ICD-10-CM | POA: Diagnosis not present

## 2019-11-10 ENCOUNTER — Ambulatory Visit (HOSPITAL_COMMUNITY)
Admission: RE | Admit: 2019-11-10 | Discharge: 2019-11-10 | Disposition: A | Discharge status: Home / Self Care | Payer: Medicare Other | Source: Ambulatory Visit | Attending: Vascular Surgery | Admitting: Vascular Surgery

## 2019-11-10 ENCOUNTER — Ambulatory Visit (INDEPENDENT_AMBULATORY_CARE_PROVIDER_SITE_OTHER): Payer: Medicare Other | Admitting: Physician Assistant

## 2019-11-10 ENCOUNTER — Other Ambulatory Visit: Payer: Self-pay

## 2019-11-10 VITALS — BP 165/97 | HR 71 | Resp 18 | Ht 76.0 in | Wt 241.0 lb

## 2019-11-10 DIAGNOSIS — I714 Abdominal aortic aneurysm, without rupture, unspecified: Secondary | ICD-10-CM

## 2019-11-10 NOTE — Progress Notes (Signed)
HISTORY AND PHYSICAL     CC:  follow up. Requesting Provider:  Emeterio Reeve, DO  HPI: This is a 69 y.o. male who is here today for follow up for EVAR on by Dr. 03/27/2016 by Dr. Trula Slade.  Prior to his surgery, the patient presented complaining of abdominal pain in his left lower quadrant that radiated to his scrotum. During his workup a CT scan was negative for diverticulitis but did reveal a left kidney stone. Because of his persistent pain, the patient had his aneurysm repaired. Postoperatively he continued to have pain but this resolved with continued antibiotics for possible urinary tract infection.  After his operation he presented to the emergency department on 04/23/2016 with abdominal pain and bright red blood per rectum. He was diagnosed with diverticulosis.  Pt was last seen 02/09/2018 by Dr. Trula Slade and at that time, he was doing well and to return in one year with ultrasound.  The pt returns today for follow up.  He is doing well. He has had a defibrillator placed since his last office visit. He denies any chest pain, shortness of breath, back pain, abdominal pain or lower extremity weakness, claudication symptoms or rest pain  The pt is on a statin for cholesterol management.    The pt is not on an aspirin.    Other AC:  None The pt is on BB, ARB for hypertension.  The pt does not have diabetes. Tobacco hx:  Former-quit 2018  Pt does not have family hx of AAA.  Past Medical History:  Diagnosis Date  . CHF (congestive heart failure) ([place])   . Diverticulosis   . Dysrhythmia    heart palpitations infrequent  . Hemorrhoids   . History of AAA (abdominal aortic aneurysm) repair   . History of kidney stones   . Hyperlipidemia   . Hypertension   . Kidney stones     Past Surgical History:  Procedure Laterality Date  . ABDOMINAL AORTIC ENDOVASCULAR STENT GRAFT Bilateral 03/27/2016   Procedure: ABDOMINAL AORTIC ENDOVASCULAR STENT GRAFT;  Surgeon: Serafina Mitchell,  MD;  Location: [place];  Service: Vascular;  Laterality: Bilateral;  . CARDIAC DEFIBRILLATOR PLACEMENT    . COLONSCOPY  AGE 54  . NEPHROLITHOTOMY Left 07/11/2016   Procedure: NEPHROLITHOTOMY PERCUTANEOUS WITH SURGEON ACESS;  Surgeon: Ardis Hughs, MD;  Location: [facility];  Service: Urology;  Laterality: Left;    Allergies  Allergen Reactions  . Penicillins Rash and Other (See Comments)    Has patient had a PCN reaction causing immediate rash, facial/tongue/throat swelling, SOB or lightheadedness with hypotension: Yes Has patient had a PCN reaction causing severe rash involving mucus membranes or skin necrosis: No Has patient had a PCN reaction that required hospitalization No Has patient had a PCN reaction occurring within the last 10 years: No If all of the above answers are "NO", then may proceed with Cephalosporin use.   Unknown reaction  Has patient had a PCN reaction causing immediate rash, facial/tongue/throat swelling, SOB or lightheadedness with hypotension: Yes Has patient had a PCN reaction causing severe rash involving mucus membranes or skin necrosis: No Has patient had a PCN reaction that required hospitalization No Has patient had a PCN reaction occurring within the last 10 years: No If all of the above answers are "NO", then may proceed with Cephalosporin use.    Current Outpatient Medications  Medication Sig Dispense Refill  . ALPRAZolam (XANAX) 0.5 MG tablet Take 1 tablet (0.5 mg total) by mouth 2 (two) times  daily as needed for anxiety. #30 for 30 days - Sparing use to prevent tolerance/dependence 30 tablet 2  . atorvastatin (LIPITOR) 40 MG tablet TAKE 1 TABLET(40 MG) BY MOUTH DAILY 30 tablet 2  . carvedilol (COREG) 12.5 MG tablet TAKE 1 TABLET(12.5 MG) BY MOUTH TWICE DAILY WITH A MEAL 180 tablet 3  . carvedilol (COREG) 25 MG tablet Take 1 tablet (25 mg total) by mouth 2 (two) times daily with a meal. 180 tablet 1  . hydrALAZINE (APRESOLINE) 100 MG tablet     .  ibuprofen (ADVIL,MOTRIN) 200 MG tablet Take 400 mg by mouth every 6 (six) hours as needed for mild pain.    . isosorbide mononitrate (IMDUR) 30 MG 24 hr tablet Take 30 mg by mouth daily.    Marland Kitchen losartan (COZAAR) 100 MG tablet Take 1 tablet (100 mg total) by mouth daily. 90 tablet 3   No current facility-administered medications for this visit.    Family History  Problem Relation Age of Onset  . Cancer Father        stomach    Social History   Socioeconomic History  . Marital status: Legally Separated    Spouse name: Not on file  . Number of children: Not on file  . Years of education: Not on file  . Highest education level: Not on file  Occupational History  . Not on file  Tobacco Use  . Smoking status: Former Smoker    Types: Cigarettes    Quit date: 05/02/2017    Years since quitting: 2.5  . Smokeless tobacco: Never Used  . Tobacco comment: 1 pk per day  Substance and Sexual Activity  . Alcohol use: No  . Drug use: No  . Sexual activity: Never  Other Topics Concern  . Not on file  Social History Narrative  . Not on file   Social Determinants of Health   Financial Resource Strain:   . Difficulty of Paying Living Expenses:   Food Insecurity:   . Worried About Charity fundraiser in the Last Year:   . Arboriculturist in the Last Year:   Transportation Needs:   . Film/video editor (Medical):   Marland Kitchen Lack of Transportation (Non-Medical):   Physical Activity:   . Days of Exercise per Week:   . Minutes of Exercise per Session:   Stress:   . Feeling of Stress :   Social Connections:   . Frequency of Communication with Friends and Family:   . Frequency of Social Gatherings with Friends and Family:   . Attends Religious Services:   . Active Member of Clubs or Organizations:   . Attends Archivist Meetings:   Marland Kitchen Marital Status:   Intimate Partner Violence:   . Fear of Current or Ex-Partner:   . Emotionally Abused:   Marland Kitchen Physically Abused:   . Sexually  Abused:      REVIEW OF SYSTEMS:  Review of Systems  Constitutional: Negative for chills, fever and malaise/fatigue.  Eyes: Negative for double vision.  Respiratory: Negative for cough and shortness of breath.   Cardiovascular: Negative for chest pain, palpitations, claudication and leg swelling.  Gastrointestinal: Negative for abdominal pain, blood in stool, constipation, diarrhea, heartburn, nausea and vomiting.  Genitourinary: Negative for dysuria.  Musculoskeletal: Negative for myalgias.  Neurological: Positive for weakness. Negative for dizziness and headaches.  Endo/Heme/Allergies: Does not bruise/bleed easily.    PHYSICAL EXAMINATION:  Vitals:   11/10/19 0906 11/10/19 0908  BP: (!) 169/102 Marland Kitchen)  165/97  Pulse: 71   Resp: 18   SpO2: 96%    General:  WDWN in NAD; vital signs documented above Gait: Normal HENT: WNL, normocephalic Pulmonary: normal non-labored breathing , without wheezing Cardiac: regular HR, without  Murmur; without carotid bruit Abdomen: obese, soft, NT, no masses. No palpable AAA Vascular Exam/Pulses:  Right Left  Radial 2+ (normal) 2+ (normal)  Femoral 2+ (normal) 2+ (normal)  Popliteal 2+ (normal) 2+ (normal)  DP 2+ (normal) 2+ (normal)  PT 2+ (normal) 2+ (normal)   Extremities: without ischemic changes, without Gangrene , without cellulitis; without open wounds;  Musculoskeletal: no muscle wasting or atrophy  Neurologic: A&O X 3;  No focal weakness or paresthesias are detected Psychiatric:  The pt has Normal affect.   Non-Invasive Vascular Imaging:   EVAR duplex on 11/10/2019:  Endovascular Aortic Repair (EVAR):  +----------+----------------+-------------------+-------------------+       Diameter AP (cm)Diameter Trans (cm)Velocities (cm/sec)  +----------+----------------+-------------------+-------------------+  Aorta   4.55      4.95        67             +----------+----------------+-------------------+-------------------+  Right Limb1.63      1.49        71           +----------+----------------+-------------------+-------------------+  Left Limb 1.74      1.53        86           +----------+----------------+-------------------+-------------------+   Summary:  Abdominal Aorta: Previous diameter measurement was 4.3 cm obtained on  02/09/2018. Todays largest diamater obtained is 4.55 x 4.95, limited  visualiztion.     EVAR duplex on 02/09/2018: Endovascular Aortic Repair (EVAR):  +----------+----------------+------------------+-------------------+-------  ----+       Diameter AP (cm)Diameter Trans  Velocities  (cm/sec)Comments                 (cm)                         +----------+----------------+------------------+-------------------+-------  ----+  Aorta   4.10      4.30       51         No  endoleak  +----------+----------------+------------------+-------------------+-------  ----+  Right Limb1.70      1.70       82                 +----------+----------------+------------------+-------------------+-------  ----+  Left Limb 1.70      1.60       62                 +----------+----------------+------------------+-------------------+-------   Final Interpretation:  Abdominal Aorta: The largest aortic measurement is 4.3 cm. Patent  endovascular aneurysm repair with no evidence of endoleak.     ASSESSMENT/PLAN:: 69 y.o. male here for follow up for EVAR on 03/27/2016 by Dr. Trula Slade. AAA is stable without endoleak. He is without any symptoms relatable to the AAA. He will continue his current medications - He will follow up sooner if he has any concerns or new symptoms develop -pt will f/u in 1 year with EVAR  duplex    [facility] [phone]  Clinic MD:  Dr. Oneida Alar

## 2019-11-18 ENCOUNTER — Telehealth: Payer: Self-pay | Admitting: Osteopathic Medicine

## 2019-11-18 DIAGNOSIS — N183 Chronic kidney disease, stage 3 unspecified: Secondary | ICD-10-CM

## 2019-11-18 DIAGNOSIS — I10 Essential (primary) hypertension: Secondary | ICD-10-CM | POA: Diagnosis not present

## 2019-11-18 DIAGNOSIS — Z0000 Encounter for general adult medical examination without abnormal findings: Secondary | ICD-10-CM | POA: Diagnosis not present

## 2019-11-18 DIAGNOSIS — Z125 Encounter for screening for malignant neoplasm of prostate: Secondary | ICD-10-CM

## 2019-11-18 DIAGNOSIS — I447 Left bundle-branch block, unspecified: Secondary | ICD-10-CM | POA: Diagnosis not present

## 2019-11-18 NOTE — Telephone Encounter (Signed)
Needs labs

## 2019-11-19 LAB — COMPLETE METABOLIC PANEL WITH GFR
AG Ratio: 1.7 (calc) (ref 1.0–2.5)
ALT: 11 U/L (ref 9–46)
AST: 11 U/L (ref 10–35)
Albumin: 3.9 g/dL (ref 3.6–5.1)
Alkaline phosphatase (APISO): 51 U/L (ref 35–144)
BUN/Creatinine Ratio: 17 (calc) (ref 6–22)
BUN: 23 mg/dL (ref 7–25)
CO2: 26 mmol/L (ref 20–32)
Calcium: 8.5 mg/dL — ABNORMAL LOW (ref 8.6–10.3)
Chloride: 109 mmol/L (ref 98–110)
Creat: 1.34 mg/dL — ABNORMAL HIGH (ref 0.70–1.25)
GFR, Est African American: 64 mL/min/{1.73_m2} (ref >=60)
GFR, Est Non African American: 55 mL/min/{1.73_m2} — ABNORMAL LOW (ref >=60)
Globulin: 2.3 g/dL (calc) (ref 1.9–3.7)
Glucose, Bld: 81 mg/dL (ref 65–99)
Potassium: 4.2 mmol/L (ref 3.5–5.3)
Sodium: 140 mmol/L (ref 135–146)
Total Bilirubin: 0.7 mg/dL (ref 0.2–1.2)
Total Protein: 6.2 g/dL (ref 6.1–8.1)

## 2019-11-19 LAB — CBC
HCT: 42.5 % (ref 38.5–50.0)
Hemoglobin: 14.2 g/dL (ref 13.2–17.1)
MCH: 31.3 pg (ref 27.0–33.0)
MCHC: 33.4 g/dL (ref 32.0–36.0)
MCV: 93.6 fL (ref 80.0–100.0)
MPV: 10.2 fL (ref 7.5–12.5)
Platelets: 203 10*3/uL (ref 140–400)
RBC: 4.54 10*6/uL (ref 4.20–5.80)
RDW: 13.1 % (ref 11.0–15.0)
WBC: 6.6 10*3/uL (ref 3.8–10.8)

## 2019-11-19 LAB — LIPID PANEL
Cholesterol: 119 mg/dL (ref <200)
HDL: 44 mg/dL (ref >=40)
LDL Cholesterol (Calc): 59 mg/dL (calc)
Non-HDL Cholesterol (Calc): 75 mg/dL (calc) (ref <130)
Total CHOL/HDL Ratio: 2.7 (calc) (ref <5.0)
Triglycerides: 82 mg/dL (ref <150)

## 2019-11-23 ENCOUNTER — Ambulatory Visit (INDEPENDENT_AMBULATORY_CARE_PROVIDER_SITE_OTHER): Payer: Medicare Other | Admitting: Osteopathic Medicine

## 2019-11-23 ENCOUNTER — Encounter: Payer: Self-pay | Admitting: Osteopathic Medicine

## 2019-11-23 VITALS — BP 131/80 | HR 71 | Temp 98.1°F | Wt 244.0 lb

## 2019-11-23 DIAGNOSIS — I714 Abdominal aortic aneurysm, without rupture, unspecified: Secondary | ICD-10-CM

## 2019-11-23 DIAGNOSIS — R911 Solitary pulmonary nodule: Secondary | ICD-10-CM

## 2019-11-23 DIAGNOSIS — N183 Chronic kidney disease, stage 3 unspecified: Secondary | ICD-10-CM | POA: Diagnosis not present

## 2019-11-23 DIAGNOSIS — I10 Essential (primary) hypertension: Secondary | ICD-10-CM | POA: Diagnosis not present

## 2019-11-23 DIAGNOSIS — Z7189 Other specified counseling: Secondary | ICD-10-CM

## 2019-11-23 DIAGNOSIS — Z8673 Personal history of transient ischemic attack (TIA), and cerebral infarction without residual deficits: Secondary | ICD-10-CM | POA: Diagnosis not present

## 2019-11-23 DIAGNOSIS — Z95810 Presence of automatic (implantable) cardiac defibrillator: Secondary | ICD-10-CM

## 2019-11-23 DIAGNOSIS — Z1211 Encounter for screening for malignant neoplasm of colon: Secondary | ICD-10-CM

## 2019-11-23 DIAGNOSIS — Z122 Encounter for screening for malignant neoplasm of respiratory organs: Secondary | ICD-10-CM

## 2019-11-23 DIAGNOSIS — I5022 Chronic systolic (congestive) heart failure: Secondary | ICD-10-CM

## 2019-11-23 NOTE — Patient Instructions (Signed)
General Preventive Care  Most recent routine screening labs: done!   Blood pressure goal 130/80 or less.   Tobacco: don't!   Alcohol: responsible moderation is ok for most adults - if you have concerns about your alcohol intake, please talk to me!   Exercise: as tolerated to reduce risk of cardiovascular disease and diabetes. Strength training will also prevent osteoporosis.   Mental health: if need for mental health care (medicines, counseling, other), or concerns about moods, please let me know!   Sexual / Reproductive health: if need for STD testing, or if concerns with libido/pain problems, please let me know!   Advanced Directive: Living Will and/or Healthcare Power of Attorney recommended for all adults, regardless of age or health.  Vaccines  Flu vaccine: for almost everyone, every fall.   Shingles vaccine: after age 50.   Pneumonia vaccines: after age 38  Tetanus booster: every 10 years, due 2029  COVID vaccine: [place] screenings   Colon cancer screening: for everyone age 58-75. Colonoscopy available for all, many people also qualify for the Cologuard stool test   Prostate cancer screening: PSA blood test annually age 25-71   Lung cancer screening: CT chest every year for those aged 74 to 68 years who have a 20 pack-year smoking history and currently smoke or have quit within the past 15 years  Infection screenings  . HIV: recommended screening at least once age 73-65 . Gonorrhea/Chlamydia: screening as needed . Hepatitis C: recommended once for everyone age 59-75 . TB: certain at-risk populations, or depending on work requirements and/or travel history Other . Bone Density Test: recommended age 46 . Abdominal Aortic Aneurysm: follow with vascular surgery

## 2019-11-23 NOTE — Progress Notes (Signed)
Anthony Pierce is a 69 y.o. male who presents to  [place] at [facility]  today, 11/23/19, seeking care for the following: . Routine check up HTN     ASSESSMENT & PLAN with other pertinent history/findings:  The primary encounter diagnosis was Essential hypertension. Diagnoses of Stage 3 chronic kidney disease, unspecified whether stage 3a or 3b CKD, History of cardiac defibrillator placement, History of transient ischemic attack (TIA), Colon cancer screening, Abdominal aortic aneurysm (AAA) without rupture ([place]), Chronic systolic congestive heart failure ([place]), Pulmonary nodule, Encounter for screening for lung cancer, and Advance directive discussed with patient were also pertinent to this visit.   1. Essential hypertension Stable, though elevated on intake. Following w/ cardiology   2. Stage 3 chronic kidney disease, unspecified whether stage 3a or 3b CKD stable  3. History of cardiac defibrillator placement Doing well since this procedure, feeling more energetic  4. History of transient ischemic attack (TIA) No recurrent neurologic deficits   5. Colon cancer screening Normal colonoscopy age 10, pt amenable to cologuard test  6. Abdominal aortic aneurysm (AAA) without rupture ([place]) Following w/ vascular surgery  7. Chronic systolic congestive heart failure [facility]) Following w/ cardiology   8. Pulmonary nodule pulm f/u has been on hold through [facility], last CT 04/2018, will send referral to follow up   9. Encounter for screening for lung cancer See above  10. Advance Directive Sister in charge!   Patient Instructions  General Preventive Care  Most recent routine screening labs: done!   Blood pressure goal 130/80 or less.   Tobacco: don't!   Alcohol: responsible moderation is ok for most adults - if you have concerns about your alcohol intake, please talk to me!   Exercise: as tolerated to reduce risk of cardiovascular  disease and diabetes. Strength training will also prevent osteoporosis.   Mental health: if need for mental health care (medicines, counseling, other), or concerns about moods, please let me know!   Sexual / Reproductive health: if need for STD testing, or if concerns with libido/pain problems, please let me know!   Advanced Directive: Living Will and/or Healthcare Power of Attorney recommended for all adults, regardless of age or health.  Vaccines  Flu vaccine: for almost everyone, every fall.   Shingles vaccine: after age 24.   Pneumonia vaccines: after age 53  Tetanus booster: every 10 years, due 2029  COVID vaccine: [place] screenings   Colon cancer screening: for everyone age 64-75. Colonoscopy available for all, many people also qualify for the Cologuard stool test   Prostate cancer screening: PSA blood test annually age 52-71   Lung cancer screening: CT chest every year for those aged 65 to 36 years who have a 20 pack-year smoking history and currently smoke or have quit within the past 15 years  Infection screenings  . HIV: recommended screening at least once age 16-65 . Gonorrhea/Chlamydia: screening as needed . Hepatitis C: recommended once for everyone age 65-75 . TB: certain at-risk populations, or depending on work requirements and/or travel history Other . Bone Density Test: recommended age 33 . Abdominal Aortic Aneurysm: follow with vascular surgery   Constitutional:  . VSS, see nurse notes . General Appearance: alert, well-developed, well-nourished, NAD Respiratory: . Normal respiratory effort . Breath sounds normal, no wheeze/rhonchi/rales Cardiovascular: . S1/S2 normal, no murmur/rub/gallop auscultated . No carotid bruit or JVD . Pedal pulse II/IV bilaterally DP and PT . No lower extremity edema Musculoskeletal:  .  Gait normal . No clubbing/cyanosis of digits Neurological: . No cranial nerve deficit on limited exam . Motor and  sensation intact and symmetric Psychiatric: . Normal judgment/insight . Normal mood and affect   Orders Placed This Encounter  Procedures  . Cologuard  . Ambulatory referral to Pulmonology    No orders of the defined types were placed in this encounter.      Follow-up instructions: Return in about 6 months (around 05/24/2020) for MEDICARE WELLNESS VISIT - see [place] sooner if needed .                                         BP 131/80 (BP Location: Left Arm, Patient Position: Sitting, Cuff Size: Large)   Pulse 71   Temp 98.1 F (36.7 C) (Oral)   Wt 244 lb (110.7 kg)   BMI 29.70 kg/m   Current Meds  Medication Sig  . ALPRAZolam (XANAX) 0.5 MG tablet Take 1 tablet (0.5 mg total) by mouth 2 (two) times daily as needed for anxiety. #30 for 30 days - Sparing use to prevent tolerance/dependence  . atorvastatin (LIPITOR) 40 MG tablet TAKE 1 TABLET(40 MG) BY MOUTH DAILY  . carvedilol (COREG) 12.5 MG tablet TAKE 1 TABLET(12.5 MG) BY MOUTH TWICE DAILY WITH A MEAL  . carvedilol (COREG) 25 MG tablet Take 1 tablet (25 mg total) by mouth 2 (two) times daily with a meal.  . hydrALAZINE (APRESOLINE) 100 MG tablet   . ibuprofen (ADVIL,MOTRIN) 200 MG tablet Take 400 mg by mouth every 6 (six) hours as needed for mild pain.  . isosorbide mononitrate (IMDUR) 30 MG 24 hr tablet Take 30 mg by mouth daily.  Marland Kitchen losartan (COZAAR) 100 MG tablet Take 1 tablet (100 mg total) by mouth daily.    No results found for this or any previous visit (from the past 72 hour(s)).  No results found.  Depression screen [facility] 2/9 02/09/2019 08/07/2017 05/08/2017  Decreased Interest 0 0 0  Down, Depressed, Hopeless 0 1 2  PHQ - 2 Score 0 1 2  Altered sleeping 0 2 1  Tired, decreased energy 1 1 0  Change in appetite 0 0 0  Feeling bad or failure about yourself  0 2 2  Trouble concentrating 0 0 0  Moving slowly or fidgety/restless 0 1 0  Suicidal thoughts 0 0 0  PHQ-9 Score 1 7 5    Difficult doing work/chores Not difficult at all Somewhat difficult -    GAD 7 : Generalized Anxiety Score 02/09/2019 08/07/2017  Nervous, Anxious, on Edge 1 3  Control/stop worrying 0 2  Worry too much - different things 0 1  Trouble relaxing 0 1  Restless 0 1  Easily annoyed or irritable 0 2  Afraid - awful might happen 0 0  Total GAD 7 Score 1 10  Anxiety Difficulty Not difficult at all Somewhat difficult      All questions at time of visit were answered - patient instructed to contact office with any additional concerns or updates.  ER/RTC precautions were reviewed with the patient.  Please note: voice recognition software was used to produce this document, and typos may escape review. Please contact Dr. Sheppard Coil for any needed clarifications.

## 2019-12-07 ENCOUNTER — Other Ambulatory Visit: Payer: Self-pay | Admitting: *Deleted

## 2019-12-07 DIAGNOSIS — Z87891 Personal history of nicotine dependence: Secondary | ICD-10-CM

## 2019-12-13 ENCOUNTER — Telehealth: Payer: Self-pay | Admitting: Acute Care

## 2019-12-13 NOTE — Telephone Encounter (Signed)
Patient is returning your call about the OV and CT scheduling.

## 2019-12-14 NOTE — Telephone Encounter (Signed)
Spoke with pt and rescheduled SDMV for 02/09/20 9:00. Rodena Piety will reschedule CT. PT verbalized understanding. Nothing further needed.

## 2020-01-05 ENCOUNTER — Encounter: Payer: Self-pay | Admitting: Osteopathic Medicine

## 2020-01-05 DIAGNOSIS — Z95810 Presence of automatic (implantable) cardiac defibrillator: Secondary | ICD-10-CM | POA: Diagnosis not present

## 2020-01-05 DIAGNOSIS — I712 Thoracic aortic aneurysm, without rupture: Secondary | ICD-10-CM | POA: Diagnosis not present

## 2020-01-05 DIAGNOSIS — I5022 Chronic systolic (congestive) heart failure: Secondary | ICD-10-CM | POA: Diagnosis not present

## 2020-01-05 DIAGNOSIS — I428 Other cardiomyopathies: Secondary | ICD-10-CM | POA: Diagnosis not present

## 2020-01-05 DIAGNOSIS — I447 Left bundle-branch block, unspecified: Secondary | ICD-10-CM | POA: Diagnosis not present

## 2020-01-11 ENCOUNTER — Other Ambulatory Visit: Payer: Self-pay

## 2020-01-11 DIAGNOSIS — I10 Essential (primary) hypertension: Secondary | ICD-10-CM

## 2020-01-11 MED ORDER — CARVEDILOL 25 MG PO TABS: 25.0000 mg | ORAL_TABLET | Freq: Two times a day (BID) | ORAL | 1 refills | Status: DC

## 2020-01-12 DIAGNOSIS — R918 Other nonspecific abnormal finding of lung field: Secondary | ICD-10-CM | POA: Diagnosis not present

## 2020-01-12 DIAGNOSIS — I712 Thoracic aortic aneurysm, without rupture: Secondary | ICD-10-CM | POA: Diagnosis not present

## 2020-01-20 ENCOUNTER — Other Ambulatory Visit: Payer: Self-pay | Admitting: Osteopathic Medicine

## 2020-01-26 ENCOUNTER — Ambulatory Visit: Payer: Medicare Other

## 2020-01-26 ENCOUNTER — Encounter: Payer: Medicare Other | Admitting: Acute Care

## 2020-02-08 ENCOUNTER — Encounter: Payer: Self-pay | Admitting: Osteopathic Medicine

## 2020-02-09 ENCOUNTER — Encounter: Payer: Self-pay | Admitting: Acute Care

## 2020-02-09 ENCOUNTER — Ambulatory Visit (INDEPENDENT_AMBULATORY_CARE_PROVIDER_SITE_OTHER): Payer: Medicare Other | Admitting: Acute Care

## 2020-02-09 ENCOUNTER — Ambulatory Visit (INDEPENDENT_AMBULATORY_CARE_PROVIDER_SITE_OTHER): Payer: Medicare Other

## 2020-02-09 ENCOUNTER — Other Ambulatory Visit: Payer: Self-pay

## 2020-02-09 DIAGNOSIS — F17210 Nicotine dependence, cigarettes, uncomplicated: Secondary | ICD-10-CM | POA: Diagnosis not present

## 2020-02-09 DIAGNOSIS — Z87891 Personal history of nicotine dependence: Secondary | ICD-10-CM | POA: Diagnosis not present

## 2020-02-09 DIAGNOSIS — Z122 Encounter for screening for malignant neoplasm of respiratory organs: Secondary | ICD-10-CM

## 2020-02-09 NOTE — Progress Notes (Signed)
Shared Decision Making Visit Lung Cancer Screening Program [id number])   Eligibility:  Age 69 y.o.  Pack Years Smoking History Calculation 48 pack year smoking (# packs/per year x # years smoked)  Recent History of coughing up blood  no  Unexplained weight loss? no ( >Than 15 pounds within the last 6 months )  Prior History Lung / other cancer no (Diagnosis within the last 5 years already requiring surveillance chest CT Scans).  Smoking Status Current Smoker  Former Smokers: Years since quit:NA  Quit Date: NA  Visit Components:  Discussion included one or more decision making aids. yes  Discussion included risk/benefits of screening. yes  Discussion included potential follow up diagnostic testing for abnormal scans. yes  Discussion included meaning and risk of over diagnosis. yes  Discussion included meaning and risk of False Positives. yes  Discussion included meaning of total radiation exposure. yes  Counseling Included:  Importance of adherence to annual lung cancer LDCT screening. yes  Impact of comorbidities on ability to participate in the program. yes  Ability and willingness to under diagnostic treatment. yes  Smoking Cessation Counseling:  Current Smokers:   Discussed importance of smoking cessation. yes  Information about tobacco cessation classes and interventions provided to patient. yes  Patient provided with "ticket" for LDCT Scan. yes  Symptomatic Patient. no  Counseling  Diagnosis Code: Tobacco Use Z72.0  Asymptomatic Patient yes  Counseling (Intermediate counseling: > three minutes counseling) [id number]  Former Smokers:   Discussed the importance of maintaining cigarette abstinence. yes  Diagnosis Code: Personal History of Nicotine Dependence. T06.269  Information about tobacco cessation classes and interventions provided to patient. Yes  Patient provided with "ticket" for LDCT Scan. yes  Written Order for Lung Cancer Screening with  LDCT placed in Epic. Yes (CT Chest Lung Cancer Screening Low Dose W/O CM) [id number] Z12.2-Screening of respiratory organs Z87.891-Personal history of nicotine dependence  I have spent 25 minutes of face to face time with Mr. Senn discussing the risks and benefits of lung cancer screening. We viewed a power point together that explained in detail the above noted topics. We paused at intervals to allow for questions to be asked and answered to ensure understanding.We discussed that the single most powerful action that he can take to decrease his risk of developing lung cancer is to quit smoking. We discussed whether or not he is ready to commit to setting a quit date. We discussed options for tools to aid in quitting smoking including nicotine replacement therapy, non-nicotine medications, support groups, Quit Smart classes, and behavior modification. We discussed that often times setting smaller, more achievable goals, such as eliminating 1 cigarette a day for a week and then 2 cigarettes a day for a week can be helpful in slowly decreasing the number of cigarettes smoked. This allows for a sense of accomplishment as well as providing a clinical benefit. I gave him the " Be Stronger Than Your Excuses" card with contact information for community resources, classes, free nicotine replacement therapy, and access to mobile apps, text messaging, and on-line smoking cessation help. I have also given him my card and contact information in the event he needs to contact me. We discussed the time and location of the scan, and that either Doroteo Glassman RN or I will call with the results within 24-48 hours of receiving them. I have offered him  a copy of the power point we viewed  as a resource in the event they need reinforcement of the concepts  we discussed today in the office. The patient verbalized understanding of all of  the above and had no further questions upon leaving the office. They have my contact information in  the event they have any further questions.  I spent 3 minutes counseling on smoking cessation and the health risks of continued tobacco abuse.  I explained to the patient that there has been a high incidence of coronary artery disease noted on these exams. I explained that this is a non-gated exam therefore degree or severity cannot be determined. This patient is currently on statin therapy. I have asked the patient to follow-up with their PCP regarding any incidental finding of coronary artery disease and management with diet or medication as their PCP  feels is clinically indicated. The patient verbalized understanding of the above and had no further questions upon completion of the visit.   Pt. Would like to meet with a pharmacist for smoking cessation counseling. We will call to get this scheduled.    Magdalen Spatz, NP 02/09/2020 9:29 AM

## 2020-02-10 NOTE — Progress Notes (Signed)
Please call patient and let them  know their  low dose Ct was read as a Lung RADS 2: nodules that are benign in appearance and behavior with a very low likelihood of becoming a clinically active cancer due to size or lack of growth. Recommendation per radiology is for a repeat LDCT in 12 months. .Please let them  know we will order and schedule their  annual screening scan for 02/2021. Please let them  know there was notation of CAD on their  scan.  Please remind the patient  that this is a non-gated exam therefore degree or severity of disease  cannot be determined. Please have them  follow up with their PCP regarding potential risk factor modification, dietary therapy or pharmacologic therapy if clinically indicated. Pt.  is  currently on statin therapy. Please place order for annual  screening scan for  02/2021 and fax results to PCP. Thanks so much.  Langley Gauss, there was an incidental finding of an Aortic Aneurysm , which is stable. Recommendation is for twice yearly scans. Please ask him if he has been having this followed. If not we need to refer to [facility], where he should get a scan 6 months after the one we have done. We will continue scanning annually and they can scan annually 6 months after our scans. If needed will you please place the referral? I tried clling him tonight, but there was no answer.  Thanks so much

## 2020-02-11 ENCOUNTER — Other Ambulatory Visit: Payer: Self-pay | Admitting: *Deleted

## 2020-02-11 DIAGNOSIS — Z87891 Personal history of nicotine dependence: Secondary | ICD-10-CM

## 2020-02-11 DIAGNOSIS — F17210 Nicotine dependence, cigarettes, uncomplicated: Secondary | ICD-10-CM

## 2020-03-13 DIAGNOSIS — I712 Thoracic aortic aneurysm, without rupture: Secondary | ICD-10-CM | POA: Diagnosis not present

## 2020-03-13 DIAGNOSIS — Z95810 Presence of automatic (implantable) cardiac defibrillator: Secondary | ICD-10-CM | POA: Diagnosis not present

## 2020-03-13 DIAGNOSIS — I714 Abdominal aortic aneurysm, without rupture: Secondary | ICD-10-CM | POA: Diagnosis not present

## 2020-03-13 DIAGNOSIS — I5022 Chronic systolic (congestive) heart failure: Secondary | ICD-10-CM | POA: Diagnosis not present

## 2020-03-13 DIAGNOSIS — I447 Left bundle-branch block, unspecified: Secondary | ICD-10-CM | POA: Diagnosis not present

## 2020-03-13 DIAGNOSIS — I428 Other cardiomyopathies: Secondary | ICD-10-CM | POA: Diagnosis not present

## 2020-04-19 ENCOUNTER — Other Ambulatory Visit: Payer: Self-pay | Admitting: Osteopathic Medicine

## 2020-05-23 DIAGNOSIS — R1011 Right upper quadrant pain: Secondary | ICD-10-CM | POA: Diagnosis not present

## 2020-05-23 DIAGNOSIS — Z7902 Long term (current) use of antithrombotics/antiplatelets: Secondary | ICD-10-CM | POA: Diagnosis not present

## 2020-05-23 DIAGNOSIS — Z79899 Other long term (current) drug therapy: Secondary | ICD-10-CM | POA: Diagnosis not present

## 2020-05-23 DIAGNOSIS — K5730 Diverticulosis of large intestine without perforation or abscess without bleeding: Secondary | ICD-10-CM | POA: Diagnosis not present

## 2020-05-23 DIAGNOSIS — K8020 Calculus of gallbladder without cholecystitis without obstruction: Secondary | ICD-10-CM | POA: Diagnosis not present

## 2020-05-23 DIAGNOSIS — N132 Hydronephrosis with renal and ureteral calculous obstruction: Secondary | ICD-10-CM | POA: Diagnosis not present

## 2020-05-23 DIAGNOSIS — I428 Other cardiomyopathies: Secondary | ICD-10-CM | POA: Diagnosis not present

## 2020-05-23 DIAGNOSIS — N201 Calculus of ureter: Secondary | ICD-10-CM | POA: Diagnosis not present

## 2020-05-23 DIAGNOSIS — Z880 Allergy status to penicillin: Secondary | ICD-10-CM | POA: Diagnosis not present

## 2020-05-23 DIAGNOSIS — Z87442 Personal history of urinary calculi: Secondary | ICD-10-CM | POA: Diagnosis not present

## 2020-05-23 DIAGNOSIS — N281 Cyst of kidney, acquired: Secondary | ICD-10-CM | POA: Diagnosis not present

## 2020-05-23 DIAGNOSIS — I509 Heart failure, unspecified: Secondary | ICD-10-CM | POA: Diagnosis not present

## 2020-05-23 DIAGNOSIS — F17210 Nicotine dependence, cigarettes, uncomplicated: Secondary | ICD-10-CM | POA: Diagnosis not present

## 2020-05-29 DIAGNOSIS — Z20822 Contact with and (suspected) exposure to covid-19: Secondary | ICD-10-CM | POA: Diagnosis not present

## 2020-05-29 DIAGNOSIS — Z01812 Encounter for preprocedural laboratory examination: Secondary | ICD-10-CM | POA: Diagnosis not present

## 2020-05-31 DIAGNOSIS — E669 Obesity, unspecified: Secondary | ICD-10-CM | POA: Diagnosis not present

## 2020-05-31 DIAGNOSIS — F17200 Nicotine dependence, unspecified, uncomplicated: Secondary | ICD-10-CM | POA: Diagnosis not present

## 2020-05-31 DIAGNOSIS — I5020 Unspecified systolic (congestive) heart failure: Secondary | ICD-10-CM | POA: Diagnosis not present

## 2020-05-31 DIAGNOSIS — Z950 Presence of cardiac pacemaker: Secondary | ICD-10-CM | POA: Diagnosis not present

## 2020-05-31 DIAGNOSIS — I2510 Atherosclerotic heart disease of native coronary artery without angina pectoris: Secondary | ICD-10-CM | POA: Diagnosis not present

## 2020-05-31 DIAGNOSIS — N202 Calculus of kidney with calculus of ureter: Secondary | ICD-10-CM | POA: Diagnosis not present

## 2020-05-31 DIAGNOSIS — Z79899 Other long term (current) drug therapy: Secondary | ICD-10-CM | POA: Diagnosis not present

## 2020-05-31 DIAGNOSIS — I255 Ischemic cardiomyopathy: Secondary | ICD-10-CM | POA: Diagnosis not present

## 2020-05-31 DIAGNOSIS — I130 Hypertensive heart and chronic kidney disease with heart failure and stage 1 through stage 4 chronic kidney disease, or unspecified chronic kidney disease: Secondary | ICD-10-CM | POA: Diagnosis not present

## 2020-05-31 DIAGNOSIS — N132 Hydronephrosis with renal and ureteral calculous obstruction: Secondary | ICD-10-CM | POA: Diagnosis not present

## 2020-05-31 DIAGNOSIS — N201 Calculus of ureter: Secondary | ICD-10-CM | POA: Diagnosis not present

## 2020-05-31 DIAGNOSIS — N200 Calculus of kidney: Secondary | ICD-10-CM | POA: Diagnosis not present

## 2020-05-31 DIAGNOSIS — Z7902 Long term (current) use of antithrombotics/antiplatelets: Secondary | ICD-10-CM | POA: Diagnosis not present

## 2020-05-31 DIAGNOSIS — F17210 Nicotine dependence, cigarettes, uncomplicated: Secondary | ICD-10-CM | POA: Diagnosis not present

## 2020-05-31 DIAGNOSIS — Z880 Allergy status to penicillin: Secondary | ICD-10-CM | POA: Diagnosis not present

## 2020-05-31 DIAGNOSIS — I5022 Chronic systolic (congestive) heart failure: Secondary | ICD-10-CM | POA: Diagnosis not present

## 2020-05-31 DIAGNOSIS — N189 Chronic kidney disease, unspecified: Secondary | ICD-10-CM | POA: Diagnosis not present

## 2020-05-31 DIAGNOSIS — Z95810 Presence of automatic (implantable) cardiac defibrillator: Secondary | ICD-10-CM | POA: Diagnosis not present

## 2020-05-31 DIAGNOSIS — Z6830 Body mass index (BMI) 30.0-30.9, adult: Secondary | ICD-10-CM | POA: Diagnosis not present

## 2020-06-09 ENCOUNTER — Encounter: Payer: Medicare Other | Admitting: Osteopathic Medicine

## 2020-06-11 DIAGNOSIS — I429 Cardiomyopathy, unspecified: Secondary | ICD-10-CM | POA: Diagnosis not present

## 2020-06-14 ENCOUNTER — Ambulatory Visit (INDEPENDENT_AMBULATORY_CARE_PROVIDER_SITE_OTHER): Payer: Medicare Other | Admitting: Osteopathic Medicine

## 2020-06-14 ENCOUNTER — Other Ambulatory Visit: Payer: Self-pay

## 2020-06-14 ENCOUNTER — Encounter: Payer: Medicare Other | Admitting: Osteopathic Medicine

## 2020-06-14 DIAGNOSIS — Z0000 Encounter for general adult medical examination without abnormal findings: Secondary | ICD-10-CM | POA: Diagnosis not present

## 2020-06-14 NOTE — Patient Instructions (Addendum)
Colonoscopy, Adult A colonoscopy is a procedure to look at the entire large intestine. This procedure is done using a long, thin, flexible tube that has a camera on the end. You may have a colonoscopy:  As a part of normal colorectal screening.  If you have certain symptoms, such as: ? A low number of red blood cells in your blood (anemia). ? Diarrhea that does not go away. ? Pain in your abdomen. ? Blood in your stool. A colonoscopy can help screen for and diagnose medical problems, including:  Tumors.  Extra tissue that grows where mucus forms (polyps).  Inflammation.  Areas of bleeding. Tell your health care provider about:  Any allergies you have.  All medicines you are taking, including vitamins, herbs, eye drops, creams, and over-the-counter medicines.  Any problems you or family members have had with anesthetic medicines.  Any blood disorders you have.  Any surgeries you have had.  Any medical conditions you have.  Any problems you have had with having bowel movements.  Whether you are pregnant or may be pregnant. What are the risks? Generally, this is a safe procedure. However, problems may occur, including:  Bleeding.  Damage to your intestine.  Allergic reactions to medicines given during the procedure.  Infection. This is rare. What happens before the procedure? Eating and drinking restrictions Follow instructions from your health care provider about eating or drinking restrictions, which may include:  A few days before the procedure: ? Follow a low-fiber diet. ? Avoid nuts, seeds, dried fruit, raw fruits, and vegetables.  1-3 days before the procedure: ? Eat only gelatin dessert or ice pops. ? Drink only clear liquids, such as water, clear juice, clear broth or bouillon, black coffee or tea, or clear soft drinks or sports drinks. ? Avoid liquids that contain red or purple dye.  The day of the procedure: ? Do not eat solid foods. You may  continue to drink clear liquids until up to 2 hours before the procedure. ? Do not eat or drink anything starting 2 hours before the procedure, or within the time period that your health care provider recommends. Bowel prep If you were prescribed a bowel prep to take by mouth (orally) to clean out your colon:  Take it as told by your health care provider. Starting the day before your procedure, you will need to drink a large amount of liquid medicine. The liquid will cause you to have many bowel movements of loose stool until your stool becomes almost clear or light green.  If your skin or the opening between the buttocks (anus) gets irritated from diarrhea, you may relieve the irritation using: ? Wipes with medicine in them, such as adult wet wipes with aloe and vitamin E. ? A product to soothe skin, such as petroleum jelly.  If you vomit while drinking the bowel prep: ? Take a break for up to 60 minutes. ? Begin the bowel prep again. ? Call your health care provider if you keep vomiting or you cannot take the bowel prep without vomiting.  To clean out your colon, you may also be given: ? Laxative medicines. These help you have a bowel movement. ? Instructions for enema use. An enema is liquid medicine injected into your rectum. Medicines Ask your health care provider about:  Changing or stopping your regular medicines or supplements. This is especially important if you are taking iron supplements, diabetes medicines, or blood thinners.  Taking medicines such as aspirin and ibuprofen. These  medicines can thin your blood. Do not take these medicines unless your health care provider tells you to take them.  Taking over-the-counter medicines, vitamins, herbs, and supplements. General instructions  Ask your health care provider what steps will be taken to help prevent infection. These may include washing skin with a germ-killing soap.  Plan to have someone take you home from the hospital  or clinic. What happens during the procedure?  An IV will be inserted into one of your veins.  You may be given one or more of the following: ? A medicine to help you relax (sedative). ? A medicine to numb the area (local anesthetic). ? A medicine to make you fall asleep (general anesthetic). This is rarely needed.  You will lie on your side with your knees bent.  The tube will: ? Have oil or gel put on it (be lubricated). ? Be inserted into your anus. ? Be gently eased through all parts of your large intestine.  Air will be sent into your colon to keep it open. This may cause some pressure or cramping.  Images will be taken with the camera and will appear on a screen.  A small tissue sample may be removed to be looked at under a microscope (biopsy). The tissue may be sent to a lab for testing if any signs of problems are found.  If small polyps are found, they may be removed and checked for cancer cells.  When the procedure is finished, the tube will be removed. The procedure may vary among health care providers and hospitals.   What happens after the procedure?  Your blood pressure, heart rate, breathing rate, and blood oxygen level will be monitored until you leave the hospital or clinic.  You may have a small amount of blood in your stool.  You may pass gas and have mild cramping or bloating in your abdomen. This is caused by the air that was used to open your colon during the exam.  Do not drive for 24 hours after the procedure.  It is up to you to get the results of your procedure. Ask your health care provider, or the department that is doing the procedure, when your results will be ready. Summary  A colonoscopy is a procedure to look at the entire large intestine.  Follow instructions from your health care provider about eating and drinking before the procedure.  If you were prescribed an oral bowel prep to clean out your colon, take it as told by your health care  provider.  During the colonoscopy, a flexible tube with a camera on its end is inserted into the anus and then passed into the other parts of the large intestine. This information is not intended to replace advice given to you by your health care provider. Make sure you discuss any questions you have with your health care provider. Document Revised: 12/11/2018 Document Reviewed: 12/11/2018 Elsevier Patient Education  2021 [place] and Cholesterol Restricted Eating Plan Eating a diet that limits fat and cholesterol may help lower your risk for heart disease and other conditions. Your body needs fat and cholesterol for basic functions, but eating too much of these things can be harmful to your health. Your health care provider may order lab tests to check your blood fat (lipid) and cholesterol levels. This helps your health care provider understand your risk for certain conditions and whether you need to make diet changes. Work with your health care provider or  dietitian to make an eating plan that is right for you. Your plan includes:  Limit your fat intake to ______% or less of your total calories a day.  Limit your saturated fat intake to ______% or less of your total calories a day.  Limit the amount of cholesterol in your diet to less than _________mg a day.  Eat ___________ g of fiber a day. What are tips for following this plan? General guidelines  If you are overweight, work with your health care provider to lose weight safely. Losing just 5-10% of your body weight can improve your overall health and help prevent diseases such as diabetes and heart disease.  Avoid: ? Foods with added sugar. ? Fried foods. ? Foods that contain partially hydrogenated oils, including stick margarine, some tub margarines, cookies, crackers, and other baked goods.  Limit alcohol intake to no more than 1 drink a day for nonpregnant women and 2 drinks a day for men. One drink equals 12 oz of  beer, 5 oz of wine, or 1 oz of hard liquor.   Reading food labels  Check food labels for: ? Trans fats, partially hydrogenated oils, or high amounts of saturated fat. Avoid foods that contain saturated fat and trans fat. ? The amount of cholesterol in each serving. Try to eat no more than 200 mg of cholesterol each day. ? The amount of fiber in each serving. Try to eat at least 20-30 g of fiber each day.  Choose foods with healthy fats, such as: ? Monounsaturated and polyunsaturated fats. These include olive and canola oil, flaxseeds, walnuts, almonds, and seeds. ? Omega-3 fats. These are found in foods such as salmon, mackerel, sardines, tuna, flaxseed oil, and ground flaxseeds.  Choose grain products that have whole grains. Look for the word "whole" as the first word in the ingredient list. Cooking  Cook foods using methods other than frying. Baking, boiling, grilling, and broiling are some healthy options.  Eat more home-cooked food and less restaurant, buffet, and fast food.  Avoid cooking using saturated fats. ? Animal sources of saturated fats include meats, butter, and cream. ? Plant sources of saturated fats include palm oil, palm kernel oil, and coconut oil. Meal planning  At meals, imagine dividing your plate into fourths: ? Fill one-half of your plate with vegetables and green salads. ? Fill one-fourth of your plate with whole grains. ? Fill one-fourth of your plate with lean protein foods.  Eat fish that is high in omega-3 fats at least two times a week.  Eat more foods that contain fiber, such as whole grains, beans, apples, broccoli, carrots, peas, and barley. These foods help promote healthy cholesterol levels in the blood.   Recommended foods Grains  Whole grains, such as whole wheat or whole grain breads, crackers, cereals, and pasta. Unsweetened oatmeal, bulgur, barley, quinoa, or brown rice. Corn or whole wheat flour tortillas. Vegetables  Fresh or frozen  vegetables (raw, steamed, roasted, or grilled). Green salads. Fruits  All fresh, canned (in natural juice), or frozen fruits. Meats and other protein foods  Ground beef (85% or leaner), grass-fed beef, or beef trimmed of fat. Skinless chicken or [place]. Ground chicken or [place]. Pork trimmed of fat. All fish and seafood. Egg whites. Dried beans, peas, or lentils. Unsalted nuts or seeds. Unsalted canned beans. Natural nut butters without added sugar and oil. Dairy  Low-fat or nonfat dairy products, such as skim or 1% milk, 2% or reduced-fat cheeses, low-fat and fat-free ricotta or  cottage cheese, or plain low-fat and nonfat yogurt. Fats and oils  Tub margarine without trans fats. Light or reduced-fat mayonnaise and salad dressings. Avocado. Olive, canola, sesame, or safflower oils. The items listed above may not be a complete list of foods and beverages you can eat. Contact a dietitian for more information. Foods to avoid Grains  White bread. White pasta. White rice. Cornbread. Bagels, pastries, and croissants. Crackers and snack foods that contain trans fat and hydrogenated oils. Vegetables  Vegetables cooked in cheese, cream, or butter sauce. Fried vegetables. Fruits  Canned fruit in heavy syrup. Fruit in cream or butter sauce. Fried fruit. Meats and other protein foods  Fatty cuts of meat. Ribs, chicken wings, bacon, sausage, bologna, salami, chitterlings, fatback, hot dogs, bratwurst, and packaged lunch meats. Liver and organ meats. Whole eggs and egg yolks. Chicken and [place] with skin. Fried meat. Dairy  Whole or 2% milk, cream, half-and-half, and cream cheese. Whole milk cheeses. Whole-fat or sweetened yogurt. Full-fat cheeses. Nondairy creamers and whipped toppings. Processed cheese, cheese spreads, and cheese curds. Beverages  Alcohol. Sugar-sweetened drinks such as sodas, lemonade, and fruit drinks. Fats and oils  Butter, stick margarine, lard, shortening, ghee, or bacon  fat. Coconut, palm kernel, and palm oils. Sweets and desserts  Corn syrup, sugars, honey, and molasses. Candy. Jam and jelly. Syrup. Sweetened cereals. Cookies, pies, cakes, donuts, muffins, and ice cream. The items listed above may not be a complete list of foods and beverages you should avoid. Contact a dietitian for more information. Summary  Your body needs fat and cholesterol for basic functions. However, eating too much of these things can be harmful to your health.  Work with your health care provider and dietitian to follow a diet low in fat and cholesterol. Doing this may help lower your risk for heart disease and other conditions.  Choose healthy fats, such as monounsaturated and polyunsaturated fats, and foods high in omega-3 fatty acids.  Eat fiber-rich foods, such as whole grains, beans, peas, fruits, and vegetables.  Limit or avoid alcohol, fried foods, and foods high in saturated fats, partially hydrogenated oils, and sugar. This information is not intended to replace advice given to you by your health care provider. Make sure you discuss any questions you have with your health care provider. Document Revised: 01/19/2020 Document Reviewed: 09/22/2019 Elsevier Patient Education  [place] Maintenance, Male Adopting a healthy lifestyle and getting preventive care are important in promoting health and wellness. Ask your health care provider about:  The right schedule for you to have regular tests and exams.  Things you can do on your own to prevent diseases and keep yourself healthy. What should I know about diet, weight, and exercise? Eat a healthy diet  Eat a diet that includes plenty of vegetables, fruits, low-fat dairy products, and lean protein.  Do not eat a lot of foods that are high in solid fats, added sugars, or sodium.   Maintain a healthy weight Body mass index (BMI) is a measurement that can be used to identify possible weight problems. It  estimates body fat based on height and weight. Your health care provider can help determine your BMI and help you achieve or maintain a healthy weight. Get regular exercise Get regular exercise. This is one of the most important things you can do for your health. Most adults should:  Exercise for at least 150 minutes each week. The exercise should increase your heart rate and make you sweat (  moderate-intensity exercise).  Do strengthening exercises at least twice a week. This is in addition to the moderate-intensity exercise.  Spend less time sitting. Even light physical activity can be beneficial. Watch cholesterol and blood lipids Have your blood tested for lipids and cholesterol at 68 years of age, then have this test every 5 years. You may need to have your cholesterol levels checked more often if:  Your lipid or cholesterol levels are high.  You are older than 68 years of age.  You are at high risk for heart disease. What should I know about cancer screening? Many types of cancers can be detected early and may often be prevented. Depending on your health history and family history, you may need to have cancer screening at various ages. This may include screening for:  Colorectal cancer.  Prostate cancer.  Skin cancer.  Lung cancer. What should I know about heart disease, diabetes, and high blood pressure? Blood pressure and heart disease  High blood pressure causes heart disease and increases the risk of stroke. This is more likely to develop in people who have high blood pressure readings, are of African descent, or are overweight.  Talk with your health care provider about your target blood pressure readings.  Have your blood pressure checked: ? Every 3-5 years if you are 42-93 years of age. ? Every year if you are 47 years old or older.  If you are between the ages of 78 and 48 and are a current or former smoker, ask your health care provider if you should have a  one-time screening for abdominal aortic aneurysm (AAA). Diabetes Have regular diabetes screenings. This checks your fasting blood sugar level. Have the screening done:  Once every three years after age 16 if you are at a normal weight and have a low risk for diabetes.  More often and at a younger age if you are overweight or have a high risk for diabetes. What should I know about preventing infection? Hepatitis B If you have a higher risk for hepatitis B, you should be screened for this virus. Talk with your health care provider to find out if you are at risk for hepatitis B infection. Hepatitis C Blood testing is recommended for:  Everyone born from [birth year] through 1963.  Anyone with known risk factors for hepatitis C. Sexually transmitted infections (STIs)  You should be screened each year for STIs, including gonorrhea and chlamydia, if: ? You are sexually active and are younger than 68 years of age. ? You are older than 68 years of age and your health care provider tells you that you are at risk for this type of infection. ? Your sexual activity has changed since you were last screened, and you are at increased risk for chlamydia or gonorrhea. Ask your health care provider if you are at risk.  Ask your health care provider about whether you are at high risk for HIV. Your health care provider may recommend a prescription medicine to help prevent HIV infection. If you choose to take medicine to prevent HIV, you should first get tested for HIV. You should then be tested every 3 months for as long as you are taking the medicine. Follow these instructions at home: Lifestyle  Do not use any products that contain nicotine or tobacco, such as cigarettes, e-cigarettes, and chewing tobacco. If you need help quitting, ask your health care provider.  Do not use street drugs.  Do not share needles.  Ask your health care  provider for help if you need support or information about quitting  drugs. Alcohol use  Do not drink alcohol if your health care provider tells you not to drink.  If you drink alcohol: ? Limit how much you have to 0-2 drinks a day. ? Be aware of how much alcohol is in your drink. In the [place] one drink equals one 12 oz bottle of beer (355 mL), one 5 oz glass of wine (148 mL), or one 1 oz glass of hard liquor (44 mL). General instructions  Schedule regular health, dental, and eye exams.  Stay current with your vaccines.  Tell your health care provider if: ? You often feel depressed. ? You have ever been abused or do not feel safe at home. Summary  Adopting a healthy lifestyle and getting preventive care are important in promoting health and wellness.  Follow your health care provider's instructions about healthy diet, exercising, and getting tested or screened for diseases.  Follow your health care provider's instructions on monitoring your cholesterol and blood pressure. This information is not intended to replace advice given to you by your health care provider. Make sure you discuss any questions you have with your health care provider. Document Revised: 05/13/2018 Document Reviewed: 05/13/2018 Elsevier Patient Education  2021 [place].   Steps to Quit Smoking Smoking tobacco is the leading cause of preventable death. It can affect almost every organ in the body. Smoking puts you and people around you at risk for many serious, long-lasting (chronic) diseases. Quitting smoking can be hard, but it is one of the best things that you can do for your health. It is never too late to quit. How do I get ready to quit? When you decide to quit smoking, make a plan to help you succeed. Before you quit:  Pick a date to quit. Set a date within the next 2 weeks to give you time to prepare.  Write down the reasons why you are quitting. Keep this list in places where you will see it often.  Tell your family, friends, and co-workers that you are  quitting. Their support is important.  Talk with your doctor about the choices that may help you quit.  Find out if your health insurance will pay for these treatments.  Know the people, places, things, and activities that make you want to smoke (triggers). Avoid them. What first steps can I take to quit smoking?  Throw away all cigarettes at home, at work, and in your car.  Throw away the things that you use when you smoke, such as ashtrays and lighters.  Clean your car. Make sure to empty the ashtray.  Clean your home, including curtains and carpets. What can I do to help me quit smoking? Talk with your doctor about taking medicines and seeing a counselor at the same time. You are more likely to succeed when you do both.  If you are pregnant or breastfeeding, talk with your doctor about counseling or other ways to quit smoking. Do not take medicine to help you quit smoking unless your doctor tells you to do so. To quit smoking: Quit right away  Quit smoking totally, instead of slowly cutting back on how much you smoke over a period of time.  Go to counseling. You are more likely to quit if you go to counseling sessions regularly. Take medicine You may take medicines to help you quit. Some medicines need a prescription, and some you can buy over-the-counter. Some medicines may  contain a drug called nicotine to replace the nicotine in cigarettes. Medicines may:  Help you to stop having the desire to smoke (cravings).  Help to stop the problems that come when you stop smoking (withdrawal symptoms). Your doctor may ask you to use:  Nicotine patches, gum, or lozenges.  Nicotine inhalers or sprays.  Non-nicotine medicine that is taken by mouth. Find resources Find resources and other ways to help you quit smoking and remain smoke-free after you quit. These resources are most helpful when you use them often. They include:  Online chats with a Social worker.  Phone  quitlines.  Printed Furniture conservator/restorer.  Support groups or group counseling.  Text messaging programs.  Mobile phone apps. Use apps on your mobile phone or tablet that can help you stick to your quit plan. There are many free apps for mobile phones and tablets as well as websites. Examples include Quit Guide from the [employer] and smokefree.gov   What things can I do to make it easier to quit?  Talk to your family and friends. Ask them to support and encourage you.  Call a phone quitline (1-800-QUIT-NOW), reach out to support groups, or work with a Social worker.  Ask people who smoke to not smoke around you.  Avoid places that make you want to smoke, such as: ? Bars. ? Parties. ? Smoke-break areas at work.  Spend time with people who do not smoke.  Lower the stress in your life. Stress can make you want to smoke. Try these things to help your stress: ? Getting regular exercise. ? Doing deep-breathing exercises. ? Doing yoga. ? Meditating. ? Doing a body scan. To do this, close your eyes, focus on one area of your body at a time from head to toe. Notice which parts of your body are tense. Try to relax the muscles in those areas.   How will I feel when I quit smoking? Day 1 to 3 weeks Within the first 24 hours, you may start to have some problems that come from quitting tobacco. These problems are very bad 2-3 days after you quit, but they do not often last for more than 2-3 weeks. You may get these symptoms:  Mood swings.  Feeling restless, nervous, angry, or annoyed.  Trouble concentrating.  Dizziness.  Strong desire for high-sugar foods and nicotine.  Weight gain.  Trouble pooping (constipation).  Feeling like you may vomit (nausea).  Coughing or a sore throat.  Changes in how the medicines that you take for other issues work in your body.  Depression.  Trouble sleeping (insomnia). Week 3 and afterward After the first 2-3 weeks of quitting, you may start to notice  more positive results, such as:  Better sense of smell and taste.  Less coughing and sore throat.  Slower heart rate.  Lower blood pressure.  Clearer skin.  Better breathing.  Fewer sick days. Quitting smoking can be hard. Do not give up if you fail the first time. Some people need to try a few times before they succeed. Do your best to stick to your quit plan, and talk with your doctor if you have any questions or concerns. Summary  Smoking tobacco is the leading cause of preventable death. Quitting smoking can be hard, but it is one of the best things that you can do for your health.  When you decide to quit smoking, make a plan to help you succeed.  Quit smoking right away, not slowly over a period of time.  When you start quitting, seek help from your doctor, family, or friends. This information is not intended to replace advice given to you by your health care provider. Make sure you discuss any questions you have with your health care provider. Document Revised: 02/12/2019 Document Reviewed: 08/08/2018 Elsevier Patient Education  2021 [place] Maintenance Summary and Written Plan of Care  Anthony Pierce ,  Thank you for allowing me to perform your Medicare Annual Wellness Visit and for your ongoing commitment to your health.   Health Maintenance & Immunization History Health Maintenance  Topic Date Due  . COVID-19 Vaccine (1) 06/30/2020 (Originally 12/11/1962)  . COLONOSCOPY (Pts 45-6yrs Insurance coverage will need to be confirmed)  07/03/2020 (Originally 12/11/1997)  . INFLUENZA VACCINE  08/31/2020 (Originally 01/02/2020)  . Hepatitis C Screening  06/14/2021 (Originally 1951-05-06)  . PNA vac Low Risk Adult (1 of 2 - PCV13) 06/14/2021 (Originally 12/11/2017)  . TETANUS/TDAP  11/20/2027   Immunization History  Administered Date(s) Administered  . Tdap 11/19/2017    These are the patient goals that we discussed: Goals  Addressed            This Visit's Progress   . Patient Stated       06/14/2020 AWV Goal: Exercise for General Health   Patient will verbalize understanding of the benefits of increased physical activity:  Exercising regularly is important. It will improve your overall fitness, flexibility, and endurance.  Regular exercise also will improve your overall health. It can help you control your weight, reduce stress, and improve your bone density.  Over the next year, patient will increase physical activity as tolerated with a goal of at least 150 minutes of moderate physical activity per week.   You can tell that you are exercising at a moderate intensity if your heart starts beating faster and you start breathing faster but can still hold a conversation.  Moderate-intensity exercise ideas include:  Walking 1 mile (1.6 km) in about 15 minutes  Biking  Hiking  Golfing  Dancing  Water aerobics  Patient will verbalize understanding of everyday activities that increase physical activity by providing examples like the following: ? Yard work, such as: ? Pushing a Conservation officer, nature ? Raking and bagging leaves ? Washing your car ? Pushing a stroller ? Shoveling snow ? Gardening ? Washing windows or floors  Patient will be able to explain general safety guidelines for exercising:   Before you start a new exercise program, talk with your health care provider.  Do not exercise so much that you hurt yourself, feel dizzy, or get very short of breath.  Wear comfortable clothes and wear shoes with good support.  Drink plenty of water while you exercise to prevent dehydration or heat stroke.  Work out until your breathing and your heartbeat get faster.         This is a list of Health Maintenance Items that are overdue or due now: Pneumonia shot Flu shot Covid vaccines Shingles vaccine Eye exam Colonoscopy Hep C and HIV screening  Orders/Referrals Placed Today: No orders of  the defined types were placed in this encounter.   Follow-up Plan

## 2020-06-14 NOTE — Progress Notes (Signed)
MEDICARE ANNUAL WELLNESS VISIT  06/14/2020  Telephone Visit Disclaimer This Medicare AWV was conducted by telephone due to national recommendations for restrictions regarding the COVID-19 Pandemic (e.g. social distancing).  I verified, using two identifiers, that I am speaking with Anthony Pierce or their authorized healthcare agent. I discussed the limitations, risks, security, and privacy concerns of performing an evaluation and management service by telephone and the potential availability of an in-person appointment in the future. The patient expressed understanding and agreed to proceed.  Location of Patient: Home Location of Provider (nurse):  In the office  Subjective:    Anthony Pierce is a 70 y.o. male patient of Anthony Nielsen, DO who had a Medicare Annual Wellness Visit today via telephone. Anthony Pierce is Retired and lives alone. he has 2 children. he reports that he is socially active and does interact with friends/family regularly. he is minimally physically active and enjoys playing his guitar.  Patient Care Team: Anthony Nielsen, DO as PCP - General (Osteopathic Medicine)  Advanced Directives 06/14/2020 12/30/2016 07/11/2016 07/11/2016 07/08/2016 04/23/2016 03/27/2016  Does Patient Have a Medical Advance Directive? No No - No No No No  Would patient like information on creating a medical advance directive? No - Patient declined - No - Patient declined - No - Patient declined - Yes - Educational materials given    Hospital Utilization Over the Past 12 Months: # of hospitalizations or ER visits: 1 # of surgeries: 1  Review of Systems    Patient reports that his overall health is unchanged compared to last year.  History obtained from chart review and the patient  Patient Reported Readings (BP, Pulse, CBG, Weight, etc) none  Pain Assessment Pain : No/denies pain     Current Medications & Allergies (verified) Allergies as of 06/14/2020      Reactions   Penicillins  Rash, Other (See Comments)   Has patient had a PCN reaction causing immediate rash, facial/tongue/throat swelling, SOB or lightheadedness with hypotension: Yes Has patient had a PCN reaction causing severe rash involving mucus membranes or skin necrosis: No Has patient had a PCN reaction that required hospitalization No Has patient had a PCN reaction occurring within the last 10 years: No If all of the above answers are "NO", then may proceed with Cephalosporin use. Unknown reaction  Has patient had a PCN reaction causing immediate rash, facial/tongue/throat swelling, SOB or lightheadedness with hypotension: Yes Has patient had a PCN reaction causing severe rash involving mucus membranes or skin necrosis: No Has patient had a PCN reaction that required hospitalization No Has patient had a PCN reaction occurring within the last 10 years: No If all of the above answers are "NO", then may proceed with Cephalosporin use.      Medication List       Accurate as of June 14, 2020  9:01 AM. If you have any questions, ask your nurse or doctor.        ALPRAZolam 0.5 MG tablet Commonly known as: XANAX TAKE 1 TABLET BY MOUTH TWICE DAILY AS NEEDED FOR ANXIETY, SPARING USE TO PREVENT TOLERANCE/ DEPENDENCE   atorvastatin 40 MG tablet Commonly known as: LIPITOR TAKE 1 TABLET(40 MG) BY MOUTH DAILY   carvedilol 25 MG tablet Commonly known as: COREG Take 1 tablet (25 mg total) by mouth 2 (two) times daily with a meal.   clopidogrel 75 MG tablet Commonly known as: PLAVIX Take 75 mg by mouth daily.   hydrALAZINE 100 MG tablet Commonly known as:  APRESOLINE   ibuprofen 200 MG tablet Commonly known as: ADVIL Take 400 mg by mouth every 6 (six) hours as needed for mild pain.   isosorbide mononitrate 30 MG 24 hr tablet Commonly known as: IMDUR Take 30 mg by mouth daily.   losartan 100 MG tablet Commonly known as: COZAAR Take 1 tablet (100 mg total) by mouth daily.   traMADol 50 MG  tablet Commonly known as: ULTRAM SMARTSIG:1 By Mouth 4-5 Times Daily       History (reviewed): Past Medical History:  Diagnosis Date  . CHF (congestive heart failure) ([place])   . Diverticulosis   . Dysrhythmia    heart palpitations infrequent  . Hemorrhoids   . History of AAA (abdominal aortic aneurysm) repair   . History of kidney stones   . Hyperlipidemia   . Hypertension   . Kidney stones    Past Surgical History:  Procedure Laterality Date  . ABDOMINAL AORTIC ENDOVASCULAR STENT GRAFT Bilateral 03/27/2016   Procedure: ABDOMINAL AORTIC ENDOVASCULAR STENT GRAFT;  Surgeon: Serafina Mitchell, MD;  Location: [place];  Service: Vascular;  Laterality: Bilateral;  . CARDIAC DEFIBRILLATOR PLACEMENT    . COLONSCOPY  AGE 67  . LITHOTRIPSY    . NEPHROLITHOTOMY Left 07/11/2016   Procedure: NEPHROLITHOTOMY PERCUTANEOUS WITH SURGEON ACESS;  Surgeon: Ardis Hughs, MD;  Location: [facility];  Service: Urology;  Laterality: Left;   Family History  Problem Relation Age of Onset  . Cancer Father        stomach   Social History   Socioeconomic History  . Marital status: Divorced    Spouse name: Not on file  . Number of children: 2  . Years of education: 94  . Highest education level: Associate degree: academic program  Occupational History  . 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 Smoking status: Current Every Day Smoker    Packs/day: 0.50    Types: Cigarettes    Last attempt to quit: 05/02/2017    Years since quitting: 3.1  . Smokeless tobacco: Never Used  Vaping Use  . Vaping Use: Never used  Substance and Sexual Activity  . Alcohol use: No  . Drug use: No  . Sexual activity: Never  Other Topics Concern  . Not on file  Social History Narrative   Lives alone. Likes to play the guitar for fun. Tries to walk as much as possible.   Social Determinants of Health   Financial Resource Strain: Low Risk   . Difficulty of Paying Living Expenses: Not hard  at all  Food Insecurity: No Food Insecurity  . Worried About Charity fundraiser in the Last Year: Never true  . Ran Out of Food in the Last Year: Never true  Transportation Needs: No Transportation Needs  . Lack of Transportation (Medical): No  . Lack of Transportation (Non-Medical): No  Physical Activity: Inactive  . Days of Exercise per Week: 0 days  . Minutes of Exercise per Session: 0 min  Stress: No Stress Concern Present  . Feeling of Stress : Not at all  Social Connections: Socially Isolated  . Frequency of Communication with Friends and Family: Three times a week  . Frequency of Social Gatherings with Friends and Family: Never  . Attends Religious Services: Never  . Active Member of Clubs or Organizations: No  . Attends Archivist Meetings: Never  . Marital Status: Divorced    Activities of Daily Living In your present state  of health, do you have any difficulty performing the following activities: 06/14/2020  Hearing? N  Vision? Y  Comment little blurred vision but hasn't had a recent eye exam.  Difficulty concentrating or making decisions? N  Walking or climbing stairs? N  Dressing or bathing? N  Doing errands, shopping? N  Preparing Food and eating ? N  Using the Toilet? N  In the past six months, have you accidently leaked urine? N  Do you have problems with loss of bowel control? N  Managing your Medications? N  Managing your Finances? N  Housekeeping or managing your Housekeeping? N  Some recent data might be hidden    Patient Education/ Literacy How often do you need to have someone help you when you read instructions, pamphlets, or other written materials from your doctor or pharmacy?: 1 - Never What is the last grade level you completed in school?: Associates Degree  Exercise Current Exercise Habits: The patient does not participate in regular exercise at present, Exercise limited by: None identified  Diet Patient reports consuming 2 meals a  day and 2 snack(s) a day Patient reports that his primary diet is: Regular Patient reports that she does have regular access to food.   Depression Screen PHQ 2/9 Scores 06/14/2020 02/09/2019 08/07/2017 05/08/2017  PHQ - 2 Score 0 0 1 2  PHQ- 9 Score 0 1 7 5      Fall Risk Fall Risk  06/14/2020  Falls in the past year? 0  Number falls in past yr: 0  Injury with Fall? 0  Risk for fall due to : No Fall Risks  Follow up Falls evaluation completed     Objective:  Anthony Pierce seemed alert and oriented and he participated appropriately during our telephone visit.  Blood Pressure Weight BMI  BP Readings from Last 3 Encounters:  11/23/19 131/80  11/10/19 (!) 165/97  05/25/19 132/82   Wt Readings from Last 3 Encounters:  11/23/19 244 lb (110.7 kg)  11/10/19 241 lb (109.3 kg)  05/25/19 233 lb 1.3 oz (105.7 kg)   BMI Readings from Last 1 Encounters:  11/23/19 29.70 kg/m    *Unable to obtain current vital signs, weight, and BMI due to telephone visit type  Hearing/Vision  . [place] did not seem to have difficulty with hearing/understanding during the telephone conversation . Reports that he has not had a formal eye exam by an eye care professional within the past year . Reports that he has not had a formal hearing evaluation within the past year *Unable to fully assess hearing and vision during telephone visit type  Cognitive Function: 6CIT Screen 06/14/2020  What Year? 0 points  What month? 0 points  What time? 0 points  Count back from 20 0 points  Months in reverse 0 points  Repeat phrase 0 points  Total Score 0   (Normal:0-7, Significant for Dysfunction: >8)  Normal Cognitive Function Screening: Yes   Immunization & Health Maintenance Record Immunization History  Administered Date(s) Administered  . Tdap 11/19/2017    Health Maintenance  Topic Date Due  . COVID-19 Vaccine (1) 06/30/2020 (Originally 12/11/1962)  . COLONOSCOPY (Pts 45-40yrs Insurance coverage will need  to be confirmed)  07/03/2020 (Originally 12/11/1997)  . INFLUENZA VACCINE  08/31/2020 (Originally 01/02/2020)  . Hepatitis C Screening  06/14/2021 (Originally Jun 12, 1950)  . PNA vac Low Risk Adult (1 of 2 - PCV13) 06/14/2021 (Originally 12/11/2017)  . TETANUS/TDAP  11/20/2027       Assessment  This is  a routine wellness examination for Anthony Pierce.  Health Maintenance: Due or Overdue Pneumonia shot Flu shot Covid vaccines Shingles vaccine Eye exam Colonoscopy Hep C and HIV screening  Anthony Pierce does not need a referral for Community Assistance: Care Management:   no Social Work:    no Prescription Assistance:  no Nutrition/Diabetes Education:  no   Plan:  Personalized Goals Goals Addressed            This Visit's Progress   . Patient Stated       06/14/2020 AWV Goal: Exercise for General Health   Patient will verbalize understanding of the benefits of increased physical activity:  Exercising regularly is important. It will improve your overall fitness, flexibility, and endurance.  Regular exercise also will improve your overall health. It can help you control your weight, reduce stress, and improve your bone density.  Over the next year, patient will increase physical activity as tolerated with a goal of at least 150 minutes of moderate physical activity per week.   You can tell that you are exercising at a moderate intensity if your heart starts beating faster and you start breathing faster but can still hold a conversation.  Moderate-intensity exercise ideas include:  Walking 1 mile (1.6 km) in about 15 minutes  Biking  Hiking  Golfing  Dancing  Water aerobics  Patient will verbalize understanding of everyday activities that increase physical activity by providing examples like the following: ? Yard work, such as: ? Pushing a Conservation officer, nature ? Raking and bagging leaves ? Washing your car ? Pushing a stroller ? Shoveling snow ? Gardening ? Washing  windows or floors  Patient will be able to explain general safety guidelines for exercising:   Before you start a new exercise program, talk with your health care provider.  Do not exercise so much that you hurt yourself, feel dizzy, or get very short of breath.  Wear comfortable clothes and wear shoes with good support.  Drink plenty of water while you exercise to prevent dehydration or heat stroke.  Work out until your breathing and your heartbeat get faster.       Personalized Health Maintenance & Screening Recommendations  Pneumococcal vaccine  Influenza vaccine Colorectal cancer screening Smoking cessation counseling  Lung Cancer Screening Recommended: yes (Low Dose CT Chest recommended if Age 43-80 years, 30 pack-year currently smoking OR have quit w/in past 15 years) Hepatitis C Screening recommended: yes HIV Screening recommended: yes  Advanced Directives: Written information was not prepared per patient's request.  Referrals & Orders No orders of the defined types were placed in this encounter.   Follow-up Plan . Follow-up with Emeterio Reeve, DO as planned . Schedule your appointment for the pneumonia vaccine, flu shot, Covid vaccines, shingles vaccine, eye exam and colonoscopy/cologuard.    I have personally reviewed and noted the following in the patient's chart:   . Medical and social history . Use of alcohol, tobacco or illicit drugs  . Current medications and supplements . Functional ability and status . Nutritional status . Physical activity . Advanced directives . List of other physicians . Hospitalizations, surgeries, and ER visits in previous 12 months . Vitals . Screenings to include cognitive, depression, and falls . Referrals and appointments  In addition, I have reviewed and discussed with Anthony Pierce certain preventive protocols, quality metrics, and best practice recommendations. A written personalized care plan for preventive  services as well as general preventive health recommendations is available and can  be mailed to the patient at his request.      Anthony Gens, RN  06/14/2020

## 2020-06-26 ENCOUNTER — Telehealth: Payer: Self-pay

## 2020-06-26 NOTE — Telephone Encounter (Signed)
Routing to PCP. Had recent procedures and unclear if continuing Plavix at this time.

## 2020-06-26 NOTE — Telephone Encounter (Signed)
Routing to covering provider-    [facility] requesting med refill for clopidogrel. Rx written by historical provider. Rx pended.

## 2020-06-28 NOTE — Telephone Encounter (Signed)
Refill request needs to go to his cardiologist's ask pharmacy to route refill request to the appropriate prescriber

## 2020-07-06 NOTE — Telephone Encounter (Signed)
Noted  

## 2020-07-13 ENCOUNTER — Other Ambulatory Visit: Payer: Self-pay | Admitting: Osteopathic Medicine

## 2020-07-13 DIAGNOSIS — I10 Essential (primary) hypertension: Secondary | ICD-10-CM

## 2020-07-28 ENCOUNTER — Other Ambulatory Visit: Payer: Self-pay

## 2020-07-28 MED ORDER — ALPRAZOLAM 0.5 MG PO TABS: ORAL_TABLET | ORAL | 2 refills | Status: DC

## 2020-07-28 NOTE — Telephone Encounter (Signed)
Routing to covering provider.   [facility] pharmacy requesting med refill for alprazolam. Rx pended.

## 2020-09-10 DIAGNOSIS — I429 Cardiomyopathy, unspecified: Secondary | ICD-10-CM | POA: Diagnosis not present

## 2020-10-16 ENCOUNTER — Other Ambulatory Visit: Payer: Self-pay

## 2020-10-16 DIAGNOSIS — I714 Abdominal aortic aneurysm, without rupture, unspecified: Secondary | ICD-10-CM

## 2020-10-20 ENCOUNTER — Other Ambulatory Visit: Payer: Self-pay

## 2020-10-20 ENCOUNTER — Ambulatory Visit (INDEPENDENT_AMBULATORY_CARE_PROVIDER_SITE_OTHER): Payer: Medicare Other | Admitting: Osteopathic Medicine

## 2020-10-20 ENCOUNTER — Encounter: Payer: Self-pay | Admitting: Osteopathic Medicine

## 2020-10-20 VITALS — BP 113/77 | HR 87 | Temp 97.9°F | Wt 244.1 lb

## 2020-10-20 DIAGNOSIS — I447 Left bundle-branch block, unspecified: Secondary | ICD-10-CM

## 2020-10-20 DIAGNOSIS — B351 Tinea unguium: Secondary | ICD-10-CM

## 2020-10-20 DIAGNOSIS — I5022 Chronic systolic (congestive) heart failure: Secondary | ICD-10-CM | POA: Diagnosis not present

## 2020-10-20 DIAGNOSIS — I428 Other cardiomyopathies: Secondary | ICD-10-CM | POA: Diagnosis not present

## 2020-10-20 DIAGNOSIS — I714 Abdominal aortic aneurysm, without rupture, unspecified: Secondary | ICD-10-CM

## 2020-10-20 DIAGNOSIS — Z95810 Presence of automatic (implantable) cardiac defibrillator: Secondary | ICD-10-CM

## 2020-10-20 DIAGNOSIS — N183 Chronic kidney disease, stage 3 unspecified: Secondary | ICD-10-CM

## 2020-10-20 DIAGNOSIS — I10 Essential (primary) hypertension: Secondary | ICD-10-CM

## 2020-10-20 MED ORDER — CICLOPIROX 8 % EX SOLN: Freq: Every day | CUTANEOUS | 11 refills | Status: DC

## 2020-10-20 MED ORDER — CLOPIDOGREL BISULFATE 75 MG PO TABS: 75.0000 mg | ORAL_TABLET | Freq: Every day | ORAL | 3 refills | Status: DC

## 2020-10-20 MED ORDER — ISOSORBIDE MONONITRATE ER 30 MG PO TB24: 30.0000 mg | ORAL_TABLET | Freq: Every day | ORAL | 3 refills | Status: DC

## 2020-10-20 MED ORDER — LOSARTAN POTASSIUM 100 MG PO TABS: 100.0000 mg | ORAL_TABLET | Freq: Every day | ORAL | 3 refills | Status: DC

## 2020-10-20 MED ORDER — ATORVASTATIN CALCIUM 40 MG PO TABS: 40.0000 mg | ORAL_TABLET | Freq: Every day | ORAL | 3 refills | Status: DC

## 2020-10-20 MED ORDER — CARVEDILOL 25 MG PO TABS: 25.0000 mg | ORAL_TABLET | Freq: Two times a day (BID) | ORAL | 3 refills | Status: DC

## 2020-10-20 NOTE — Progress Notes (Signed)
Anthony Pierce is a 70 y.o. male who presents to  [place] at [facility]  today, 10/20/20, seeking care for the following:  . Routine check-up chronic conditions - see below, overall doing well, following w/ cardiology  . New issue - nail fungus hes having trouble getting rid of      ASSESSMENT & PLAN with other pertinent findings:  The primary encounter diagnosis was Essential hypertension. Diagnoses of Stage 3 chronic kidney disease, unspecified whether stage 3a or 3b CKD ([place]), Nonischemic cardiomyopathy ([place]), Abdominal aortic aneurysm (AAA) without rupture ([place]), LBBB (left bundle branch block), Chronic systolic congestive heart failure ([place]), History of cardiac defibrillator placement, and Onychomycosis were also pertinent to this visit.    There are no Patient Instructions on file for this visit.  Orders Placed This Encounter  Procedures  . CBC  . CMP14+EGFR  . Lipid panel  . PSA Total (Reflex To Free)    Meds ordered this encounter  Medications  . carvedilol (COREG) 25 MG tablet    Sig: Take 1 tablet (25 mg total) by mouth 2 (two) times daily with a meal.    Dispense:  180 tablet    Refill:  3  . atorvastatin (LIPITOR) 40 MG tablet    Sig: Take 1 tablet (40 mg total) by mouth daily.    Dispense:  90 tablet    Refill:  3  . clopidogrel (PLAVIX) 75 MG tablet    Sig: Take 1 tablet (75 mg total) by mouth daily.    Dispense:  90 tablet    Refill:  3  . isosorbide mononitrate (IMDUR) 30 MG 24 hr tablet    Sig: Take 1 tablet (30 mg total) by mouth daily.    Dispense:  90 tablet    Refill:  3  . losartan (COZAAR) 100 MG tablet    Sig: Take 1 tablet (100 mg total) by mouth daily.    Dispense:  90 tablet    Refill:  3  . ciclopirox (PENLAC) 8 % solution    Sig: Apply topically at bedtime. Apply over nail and surrounding skin. Apply daily over previous coat. After seven (7) days, may remove with alcohol and continue cycle.     Dispense:  18 mL    Refill:  11     See below for relevant physical exam findings  See below for recent lab and imaging results reviewed  Medications, allergies, PMH, PSH, SocH, [place] reviewed below    Follow-up instructions: Return in about 6 months (around 04/22/2021) for MEDICARE VISIT W/ DR Yogesh Cominsky - SEE [place] SOONER IF NEEDED .                                        Exam:  BP 113/77 (BP Location: Left Arm, Patient Position: Sitting, Cuff Size: Normal)   Pulse 87   Temp 97.9 F (36.6 C) (Oral)   Wt 244 lb 1.3 oz (110.7 kg)   BMI 29.71 kg/m   Constitutional: VS see above. General Appearance: alert, well-developed, well-nourished, NAD  Neck: No masses, trachea midline.   Respiratory: Normal respiratory effort. no wheeze, no rhonchi, no rales  Cardiovascular: S1/S2 normal, no murmur, no rub/gallop auscultated. RRR.   Musculoskeletal: Gait normal. Symmetric and independent movement of all extremities  Abdominal: non-tender, non-distended, no appreciable organomegaly, neg Murphy's, BS [id number]  Neurological: Normal balance/coordination. No  tremor.  Skin: warm, dry, intact.   Psychiatric: Normal judgment/insight. Normal mood and affect. Oriented x3.   Current Meds  Medication Sig  . ALPRAZolam (XANAX) 0.5 MG tablet TAKE 1 TABLET BY MOUTH TWICE DAILY AS NEEDED FOR ANXIETY, SPARING USE TO PREVENT TOLERANCE/ DEPENDENCE  . ciclopirox (PENLAC) 8 % solution Apply topically at bedtime. Apply over nail and surrounding skin. Apply daily over previous coat. After seven (7) days, may remove with alcohol and continue cycle.  . hydrALAZINE (APRESOLINE) 100 MG tablet   . traMADol (ULTRAM) 50 MG tablet SMARTSIG:1 By Mouth 4-5 Times Daily  . [DISCONTINUED] atorvastatin (LIPITOR) 40 MG tablet TAKE 1 TABLET(40 MG) BY MOUTH DAILY  . [DISCONTINUED] carvedilol (COREG) 25 MG tablet Take 1 tablet (25 mg total) by mouth 2 (two) times daily with a meal. Needs  appt/labs for refills  . [DISCONTINUED] clopidogrel (PLAVIX) 75 MG tablet Take 75 mg by mouth daily.  . [DISCONTINUED] isosorbide mononitrate (IMDUR) 30 MG 24 hr tablet Take 30 mg by mouth daily.  . [DISCONTINUED] losartan (COZAAR) 100 MG tablet Take 1 tablet (100 mg total) by mouth daily.    Allergies  Allergen Reactions  . Penicillins Rash and Other (See Comments)    Has patient had a PCN reaction causing immediate rash, facial/tongue/throat swelling, SOB or lightheadedness with hypotension: Yes Has patient had a PCN reaction causing severe rash involving mucus membranes or skin necrosis: No Has patient had a PCN reaction that required hospitalization No Has patient had a PCN reaction occurring within the last 10 years: No If all of the above answers are "NO", then may proceed with Cephalosporin use.       Patient Active Problem List   Diagnosis Date Noted  . Advance directive discussed with patient 11/23/2019  . CKD (chronic kidney disease) stage 3, GFR 30-59 ml/min (H[facility]) 11/19/2017  . Situational anxiety 05/08/2017  . LBBB (left bundle branch block) 04/15/2017  . Nonischemic cardiomyopathy ([place]) 04/15/2017  . Chronic systolic congestive heart failure ([place]) 04/14/2017  . Essential hypertension 04/14/2017  . Nephrolithiasis 07/11/2016  . AAA (abdominal aortic aneurysm) ([place]) 03/27/2016  . Tobacco abuse 07/09/2012    Family History  Problem Relation Age of Onset  . Cancer Father        stomach    Social History   Tobacco Use  Smoking Status Current Every Day Smoker  . Packs/day: 0.50  . Types: Cigarettes  . Last attempt to quit: 05/02/2017  . Years since quitting: 3.4  Smokeless Tobacco Never Used    Past Surgical History:  Procedure Laterality Date  . ABDOMINAL AORTIC ENDOVASCULAR STENT GRAFT Bilateral 03/27/2016   Procedure: ABDOMINAL AORTIC ENDOVASCULAR STENT GRAFT;  Surgeon: Serafina Mitchell, MD;  Location: [place];  Service: Vascular;  Laterality: Bilateral;   . CARDIAC DEFIBRILLATOR PLACEMENT    . COLONSCOPY  AGE 10  . LITHOTRIPSY    . NEPHROLITHOTOMY Left 07/11/2016   Procedure: NEPHROLITHOTOMY PERCUTANEOUS WITH SURGEON ACESS;  Surgeon: Ardis Hughs, MD;  Location[facility];  Service: Urology;  Laterality: Left;    Immunization History  Administered Date(s) Administered  . Tdap 11/19/2017    No results found for this or any previous visit (from the past 2160 hour(s)).  No results found.     All questions at time of visit were answered - patient instructed to contact office with any additional concerns or updates. ER/RTC precautions were reviewed with the patient as applicable.   Please note: manual typing as well as voice  recognition software may have been used to produce this document - typos may escape review. Please contact Dr. Sheppard Coil for any needed clarifications.

## 2020-10-23 ENCOUNTER — Telehealth: Payer: Self-pay | Admitting: Osteopathic Medicine

## 2020-10-23 ENCOUNTER — Other Ambulatory Visit: Payer: Self-pay | Admitting: Osteopathic Medicine

## 2020-10-23 NOTE — Chronic Care Management (AMB) (Signed)
  Chronic Care Management   Note  10/23/2020 Name: Anthony Pierce MRN: [id number] DOB: Oct 14, 1950  Anthony Pierce is a 70 y.o. year old male who is a primary care patient of Emeterio Reeve, DO. I reached out to Linus Mako by phone today in response to a referral sent by [name] PCP, Emeterio Reeve, DO.   [name] was given information about Chronic Care Management services today including:  1. CCM service includes personalized support from designated clinical staff supervised by his physician, including individualized plan of care and coordination with other care providers 2. 24/7 contact phone numbers for assistance for urgent and routine care needs. 3. Service will only be billed when office clinical staff spend 20 minutes or more in a month to coordinate care. 4. Only one practitioner may furnish and bill the service in a calendar month. 5. The patient may stop CCM services at any time (effective at the end of the month) by phone call to the office staff.   Patient agreed to services and verbal consent obtained.   Follow up plan:   Lauretta Grill [employer] Scheduler

## 2020-10-26 ENCOUNTER — Other Ambulatory Visit: Payer: Self-pay

## 2020-10-26 NOTE — Telephone Encounter (Signed)
Pt called requesting a med refill for alprazolam. Rx pended.

## 2020-10-26 NOTE — Telephone Encounter (Signed)
Pt called requesting med refill for hydralazine. Written by historical provider. Rx pended.

## 2020-10-27 MED ORDER — HYDRALAZINE HCL 100 MG PO TABS: 100.0000 mg | ORAL_TABLET | Freq: Two times a day (BID) | ORAL | 1 refills | Status: DC

## 2020-10-27 MED ORDER — ALPRAZOLAM 0.5 MG PO TABS: ORAL_TABLET | ORAL | 2 refills | Status: DC

## 2020-11-16 ENCOUNTER — Other Ambulatory Visit: Payer: Self-pay

## 2020-11-16 ENCOUNTER — Ambulatory Visit (HOSPITAL_COMMUNITY)
Admission: RE | Admit: 2020-11-16 | Discharge: 2020-11-16 | Disposition: A | Discharge status: Home / Self Care | Payer: Medicare Other | Source: Ambulatory Visit | Attending: Vascular Surgery | Admitting: Vascular Surgery

## 2020-11-16 ENCOUNTER — Ambulatory Visit (INDEPENDENT_AMBULATORY_CARE_PROVIDER_SITE_OTHER): Payer: Medicare Other | Admitting: Physician Assistant

## 2020-11-16 VITALS — BP 171/109 | HR 86 | Temp 98.0°F | Resp 16 | Ht 76.0 in | Wt 241.7 lb

## 2020-11-16 DIAGNOSIS — Z9889 Other specified postprocedural states: Secondary | ICD-10-CM | POA: Diagnosis not present

## 2020-11-16 DIAGNOSIS — I714 Abdominal aortic aneurysm, without rupture, unspecified: Secondary | ICD-10-CM

## 2020-11-16 DIAGNOSIS — Z8679 Personal history of other diseases of the circulatory system: Secondary | ICD-10-CM

## 2020-11-16 NOTE — Progress Notes (Signed)
Office Note     CC:  follow up Requesting Provider:  Emeterio Reeve, DO  HPI: Anthony Pierce is a 70 y.o. (1951/04/05) male who presents for follow up for EVAR on by Dr. 03/27/2016 by Dr. Trula Slade.  He denies significant, episodic abdominal or back pain. He has not taken his BP meds this morning.  The pt is on a statin for cholesterol management.    The pt is not on an aspirin.    Other AC:  None The pt is on BB, ARB, nitrate and hydralazine for hypertension. The pt does not have diabetes. Tobacco hx:  Former-quit 2018  Past Medical History:  Diagnosis Date   CHF (congestive heart failure) (HCC)    Diverticulosis    Dysrhythmia    heart palpitations infrequent   Hemorrhoids    History of AAA (abdominal aortic aneurysm) repair    History of kidney stones    Hyperlipidemia    Hypertension    Kidney stones     Past Surgical History:  Procedure Laterality Date   ABDOMINAL AORTIC ENDOVASCULAR STENT GRAFT Bilateral 03/27/2016   Procedure: ABDOMINAL AORTIC ENDOVASCULAR STENT GRAFT;  Surgeon: Serafina Mitchell, MD;  Location: [facility] OR;  Service: Vascular;  Laterality: Bilateral;   CARDIAC DEFIBRILLATOR PLACEMENT     COLONSCOPY  AGE 30   LITHOTRIPSY     NEPHROLITHOTOMY Left 07/11/2016   Procedure: NEPHROLITHOTOMY PERCUTANEOUS WITH SURGEON ACESS;  Surgeon: Ardis Hughs, MD;  Location: [facility];  Service: Urology;  Laterality: Left;    Social History   Socioeconomic History   Marital status: Divorced    Spouse name: Not on file   Number of children: 2   Years of education: 14   Highest education level: Associate degree: academic program  Occupational History   Occupation: Retired    Comment: Programme researcher, 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 Likes to play the guitar for fun. Tries to walk as much as possible.   Social Determinants of Health   Financial Resource Strain: Low Risk    Difficulty of Paying Living Expenses: Not hard at all  Food Insecurity: No Food Insecurity   Worried About Charity fundraiser in the Last Year: Never true   [place] in the Last Year: Never true  Transportation Needs: No Transportation Needs   Lack of Transportation (Medical): No   Lack of Transportation (Non-Medical): No  Physical Activity: Inactive   Days of Exercise per Week: 0 days   Minutes of Exercise per Session: 0 min  Stress: No Stress Concern Present   Feeling of Stress : Not at all  Social Connections: Socially Isolated   Frequency of Communication with Friends and Family: Three times a week   Frequency of Social Gatherings with Friends and Family: Never   Attends Religious Services: Never   Marine scientist or Organizations: No   Attends Archivist Meetings: Never   Marital Status: Divorced  Human resources officer Violence: Not At Risk  Fear of Current or Ex-Partner: No   Emotionally Abused: No   Physically Abused: No   Sexually Abused: No   Family History  Problem Relation Age of Onset   Cancer Father        stomach    Current Outpatient Medications  Medication Sig Dispense Refill   ALPRAZolam (XANAX) 0.5 MG tablet TAKE 1 TABLET BY MOUTH TWICE DAILY AS NEEDED FOR ANXIETY, SPARING USE TO PREVENT TOLERANCE/ DEPENDENCE 30 tablet 2   atorvastatin (LIPITOR) 40 MG tablet Take 1 tablet (40 mg total) by mouth daily. 90 tablet 3   carvedilol (COREG) 25 MG tablet TAKE 1 TABLET(25 MG) BY MOUTH TWICE DAILY WITH A MEAL 180 tablet 3   ciclopirox (PENLAC) 8 % solution Apply topically at bedtime. Apply over nail and surrounding skin. Apply daily over previous coat. After seven (7)  days, may remove with alcohol and continue cycle. 18 mL 11   clopidogrel (PLAVIX) 75 MG tablet Take 1 tablet (75 mg total) by mouth daily. 90 tablet 3   hydrALAZINE (APRESOLINE) 100 MG tablet Take 1 tablet (100 mg total) by mouth 2 (two) times daily. 180 tablet 1   ibuprofen (ADVIL,MOTRIN) 200 MG tablet Take 400 mg by mouth every 6 (six) hours as needed for mild pain. (Patient not taking: No sig reported)     isosorbide mononitrate (IMDUR) 30 MG 24 hr tablet Take 1 tablet (30 mg total) by mouth daily. 90 tablet 3   losartan (COZAAR) 100 MG tablet Take 1 tablet (100 mg total) by mouth daily. 90 tablet 3   traMADol (ULTRAM) 50 MG tablet SMARTSIG:1 By Mouth 4-5 Times Daily     No current facility-administered medications for this visit.    Allergies  Allergen Reactions   Penicillins Rash and Other (See Comments)    Has patient had a PCN reaction causing immediate rash, facial/tongue/throat swelling, SOB or lightheadedness with hypotension: Yes Has patient had a PCN reaction causing severe rash involving mucus membranes or skin necrosis: No Has patient had a PCN reaction that required hospitalization No Has patient had a PCN reaction occurring within the last 10 years: No If all of the above answers are "NO", then may proceed with Cephalosporin use.        REVIEW OF SYSTEMS:   [X]  denotes positive finding, [ ]  denotes negative finding Cardiac  Comments:  Chest pain or chest pressure:    Shortness of breath upon exertion:    Short of breath when lying flat:    Irregular heart rhythm:        Vascular    Pain in calf, thigh, or hip brought on by ambulation:    Pain in feet at night that wakes you up from your sleep:     Blood clot in your veins:    Leg swelling:         Pulmonary    Oxygen at home:    Productive cough:     Wheezing:         Neurologic    Sudden weakness in arms or legs:     Sudden numbness in arms or legs:     Sudden onset of difficulty speaking or slurred  speech:    Temporary loss of vision in one eye:     Problems with dizziness:         Gastrointestinal    Blood in stool:     Vomited blood:         Genitourinary  Burning when urinating:     Blood in urine:        Psychiatric    Major depression:         Hematologic    Bleeding problems:    Problems with blood clotting too easily:        Skin    Rashes or ulcers:        Constitutional    Fever or chills:      PHYSICAL EXAMINATION:  Vitals:   11/16/20 0923  Weight: 241 lb 11.2 oz (109.6 kg)  Height: 6\' 4"  (1.93 m)     General:  WDWN in NAD; vital signs documented above Gait: unaided; no ataxia HENT: WNL, normocephalic Pulmonary: normal non-labored breathing , without Rales, rhonchi,  wheezing Cardiac: regular HR, without  Murmurs without carotid bruits Abdomen: soft, NT, no masses Skin: without rashes Vascular Exam/Pulses: 2+ bilateral DP, PT ,radial and brachial pulses Extremities: without ischemic changes, without Gangrene , without cellulitis; without open wounds;  Musculoskeletal: no muscle wasting or atrophy  Neurologic: A&O X 3;  No focal weakness or paresthesias are detected Psychiatric:  The pt has Normal affect.   Non-Invasive Vascular Imaging:   11/16/2020  Endovascular Aortic Repair (EVAR):  +----------+----------------+----------------+------------------+----------  ----+            Diameter AP (cm)Diameter Trans  Velocities        Comments                                    (cm)            (cm/sec)                            +----------+----------------+----------------+------------------+----------  ----+  Aorta     3.86            3.88            55                                  +----------+----------------+----------------+------------------+----------  ----+  Right Limb1.47            1.44            50                                   +----------+----------------+----------------+------------------+----------  ----+  Left Limb                                                   not  visualized  +----------+----------------+----------------+------------------+----------  ----+   Summary:  Abdominal Aorta: Patent endovascular aneurysm repair with no evidence of  endoleak. The largest aortic diameter has decreased compared to prior  exam. Previous diameter measurement was 5.0 cm obtained on 11/10/2019.    ASSESSMENT/PLAN:: 70 y.o. male here for follow up for EVAR in 2017. Stable duplex with maximal aortic diameter of 3.88 cm compared to 5 cm one year ago.  Maximal diameter was 4.3 cm in 2019. Without evidence of endoleak. No symptoms referable to aorta. Continue statin and aspirin. Encouraged good BP control  and routine follow-up with PCP.  Follow-up in one year with duplex. Encouraged to seek immediate medical attention for significant and or persistent abdominal or back pain.  Barbie Banner, PA-C [facility] [phone]  Clinic MD:   Dr. Oneida Alar

## 2020-11-24 ENCOUNTER — Other Ambulatory Visit: Payer: Self-pay

## 2020-11-24 ENCOUNTER — Ambulatory Visit (INDEPENDENT_AMBULATORY_CARE_PROVIDER_SITE_OTHER): Payer: Medicare Other | Admitting: Pharmacist

## 2020-11-24 DIAGNOSIS — I10 Essential (primary) hypertension: Secondary | ICD-10-CM

## 2020-11-24 DIAGNOSIS — I5022 Chronic systolic (congestive) heart failure: Secondary | ICD-10-CM | POA: Diagnosis not present

## 2020-11-24 DIAGNOSIS — Z720 Tobacco use: Secondary | ICD-10-CM

## 2020-11-24 NOTE — Progress Notes (Signed)
Chronic Care Management Pharmacy Note  11/24/2020 Name:  Anthony Pierce MRN:  [id number] DOB:  1951-03-26  Summary: addressed HTN, HLD, and smoking cessation   Recommendations/Changes made from today's visit: recommended to check BP at home daily & document to provide for next visit w/ pharmacist. Also recommended to try either nicotine lozenge 69m or 140mpatch based upon current tobacco usage.   Plan: f/u with pharmacist in 1 month  Subjective: Anthony DOESCHERs an [age].o. year old male who is a primary patient of Anthony Pierce.  The CCM team was consulted for assistance with disease management and care coordination needs.    Engaged with patient by telephone for initial visit in response to provider referral for pharmacy case management and/or care coordination services.   Consent to Services:  The patient was given information about Chronic Care Management services, agreed to services, and gave verbal consent prior to initiation of services.  Please see initial visit note for detailed documentation.   Patient Care Team: Anthony Pierce as PCP - General (Osteopathic Medicine) KlDarius Bump[facility] Pharmacist (Pharmacist)   Objective:  Lab Results  Component Value Date   CREATININE 1.34 (H) 11/18/2019   CREATININE 1.43 (H) 03/03/2019   CREATININE 1.45 (H) 07/12/2016        Component Value Date/Time   CHOL 119 11/18/2019 1310   TRIG 82 11/18/2019 1310   HDL 44 11/18/2019 1310   CHOLHDL 2.7 11/18/2019 1310   LDLCALC 59 11/18/2019 1310    Hepatic Function Latest Ref Rng & Units 11/18/2019 03/03/2019 04/23/2016  Total Protein 6.1 - 8.1 g/dL 6.2 6.5 6.8  Albumin 3.5 - 5.0 g/dL - - 3.6  AST 10 - 35 U/L _0 ALT 9 - 46 U/L 11 11 15(L)  Alk Phosphatase 38 - 126 U/L - - 65  Total Bilirubin 0.2 - 1.2 mg/dL 0.7 0.7 0.4    No results found for: TSH, FREET4  CBC Latest Ref Rng & Units 11/18/2019 03/03/2019 02/03/2018  WBC 3.8 - 10.8 Thousand/uL 6.6 6.7 7.3   Hemoglobin 13.2 - 17.1 g/dL 14.2 15.0 15.3  Hematocrit 38.5 - 50.0 % 42.5 45.3 44.8  Platelets 140 - 400 Thousand/uL 203 242 215    Social History   Tobacco Use  Smoking Status Every Day   Packs/day: 0.50   Pack years: 0.00   Types: Cigarettes   Last attempt to quit: 05/02/2017   Years since quitting: 3.5  Smokeless Tobacco Never   BP Readings from Last 3 Encounters:  11/16/20 (!) 171/109  10/20/20 113/77  11/23/19 131/80   Pulse Readings from Last 3 Encounters:  11/16/20 86  10/20/20 87  11/23/19 71   Wt Readings from Last 3 Encounters:  11/16/20 241 lb 11.2 oz (109.6 kg)  10/20/20 244 lb 1.3 oz (110.7 kg)  11/23/19 244 lb (110.7 kg)    Assessment: Review of patient past medical history, allergies, medications, health status, including review of consultants reports, laboratory and other test data, was performed as part of comprehensive evaluation and provision of chronic care management services.   SDOH:  (Social Determinants of Health) assessments and interventions performed:    CCM Care Plan  Allergies  Allergen Reactions   Penicillins Rash and Other (See Comments)    Has patient had a PCN reaction causing immediate rash, facial/tongue/throat swelling, SOB or lightheadedness with hypotension: Yes Has patient had a PCN reaction causing severe rash involving mucus membranes or skin necrosis: No  Has patient had a PCN reaction that required hospitalization No Has patient had a PCN reaction occurring within the last 10 years: No If all of the above answers are "NO", then may proceed with Cephalosporin use.       Medications Reviewed Today     Reviewed by Anthony Pierce, [facility] (Pharmacist) on 11/24/20 at [place] List Status: <None>   Medication Order Taking? Sig Documenting Provider Last Dose Status Informant  ALPRAZolam (XANAX) 0.5 MG tablet [id number] Yes TAKE 1 TABLET BY MOUTH TWICE DAILY AS NEEDED FOR ANXIETY, SPARING USE TO PREVENT TOLERANCE/ DEPENDENCE  Emeterio Reeve, DO Taking Active   atorvastatin (LIPITOR) 40 MG tablet [id number] Yes Take 1 tablet (40 mg total) by mouth daily. Emeterio Reeve, DO Taking Active   carvedilol (COREG) 25 MG tablet [id number] Yes TAKE 1 TABLET(25 MG) BY MOUTH TWICE DAILY WITH A MEAL Emeterio Reeve, DO Taking Active   ciclopirox (PENLAC) 8 % solution [id number] No Apply topically at bedtime. Apply over nail and surrounding skin. Apply daily over previous coat. After seven (7) days, may remove with alcohol and continue cycle.  Patient not taking: Reported on 11/24/2020   Emeterio Reeve, DO Not Taking Consider Medication Status and Discontinue   clopidogrel (PLAVIX) 75 MG tablet [id number] Yes Take 1 tablet (75 mg total) by mouth daily. Emeterio Reeve, DO Taking Active   hydrALAZINE (APRESOLINE) 100 MG tablet [id number] Yes Take 1 tablet (100 mg total) by mouth 2 (two) times daily. Emeterio Reeve, DO Taking Active   ibuprofen (ADVIL,MOTRIN) 200 MG tablet [id number] Yes Take 400 mg by mouth every 6 (six) hours as needed for mild pain. [provider] Taking Active   isosorbide mononitrate (IMDUR) 30 MG 24 hr tablet [id number] Yes Take 1 tablet (30 mg total) by mouth daily. Emeterio Reeve, DO Taking Active   losartan (COZAAR) 100 MG tablet [id number] Yes Take 1 tablet (100 mg total) by mouth daily. Emeterio Reeve, DO Taking Active   traMADol (ULTRAM) 50 MG tablet [id number] No SMARTSIG:1 By Mouth 4-5 Times Daily  Patient not taking: Reported on 11/24/2020   [provider] Not Taking Active             Patient Active Problem List   Diagnosis Date Noted   Advance directive discussed with patient 11/23/2019   CKD (chronic kidney disease) stage 3, GFR 30-59 ml/min (HCC) 11/19/2017   Situational anxiety 05/08/2017   LBBB (left bundle branch block) 04/15/2017   Nonischemic cardiomyopathy ([place]) 23/30/0762   Chronic systolic congestive heart failure ([place]) 04/14/2017   Essential  hypertension 04/14/2017   Nephrolithiasis 07/11/2016   AAA (abdominal aortic aneurysm) ([place]) 03/27/2016   Tobacco abuse 07/09/2012    Immunization History  Administered Date(s) Administered   Tdap 11/19/2017    Conditions to be addressed/monitored: HTN, HLD, and tobacco use  Care Plan : Medication Management  Updates made by Anthony Pierce, [place] since 11/24/2020 12:00 AM     Problem: HTN, HLD, Tobacco use      Long-Range Goal: Disease Progression Prevention   Start Date: 11/24/2020  This Visit's Progress: On track  Priority: High  Note:   Current Barriers:  None at present  Pharmacist Clinical Goal(s):  Over the next 30 days, patient will maintain control of chronic conditions as evidenced by medication fill history, vital signs, and lab values   through collaboration with PharmD and provider.   Interventions: 1:1 collaboration with Emeterio Reeve, DO regarding development and update of comprehensive plan of care  as evidenced by provider attestation and co-signature Inter-disciplinary care team collaboration (see longitudinal plan of care) Comprehensive medication review performed; medication list updated in electronic medical record  Hypertension:  Controlled; current treatment:carvedilol 44m BID, hydralazine 1050mBID, imdur 3018maily, losartan 100m29mily;   Current home readings: not currently checking  Reports hypertensive symptoms particularly when stressed or frustrated  Educated on mechanism of action & benefits of each medication Recommended continue current regimen, check BP at home 1x daily and document for future visits in order to confirm BP is well controlled versus white coat hypertension phenomenon,  Hyperlipidemia:  Controlled; current treatment:atorvastatin 40mg23mRecommended continue current regimen Tobacco Abuse:  1/2 packs per day; does smoke within 30 minutes of waking up  Previous quit attempts: unsuccessful using patch  Triggers to smoke:  when others are smoking  Motivation to quit smoking: health benefits  Counseled on various methods for aiding efforts to stop smoking, as well as side effects and how to properly use Recommended either 4mg o35mTC nicotine lozenge every 1-2hours, or starting with 14mg p18m. Patient is debating trying either of these two methods.   Patient Goals/Self-Care Activities Over the next 30 days, patient will:  check blood pressure daily, document, and provide at future appointments and attempt smoking cessation with either lozenge or patch  Follow Up Plan: Telephone follow up appointment with care management team member scheduled for:  1 month      Medication Assistance: None required.  Patient affirms current coverage meets needs.  [id number] [id number] [phone] box? Yes Pt endorses 100% compliance  Follow Up:  Patient agrees to Care Plan and Follow-up.  Plan: Telephone follow up appointment with care management team member scheduled for:  1 month  Shandrea Lusk Anthony Pierce

## 2020-11-24 NOTE — Patient Instructions (Signed)
Visit Information   PATIENT GOALS:   Goals Addressed             This Visit's Progress    Medication Management       Patient Goals/Self-Care Activities Over the next 30 days, patient will:  check blood pressure daily, document, and provide at future appointments and attempt smoking cessation with either lozenge or patch  Follow Up Plan: Telephone follow up appointment with care management team member scheduled for:  1 month          Consent to CCM Services: Mr. Fann was given information about Chronic Care Management services today including:  CCM service includes personalized support from designated clinical staff supervised by his physician, including individualized plan of care and coordination with other care providers 24/7 contact phone numbers for assistance for urgent and routine care needs. Service will only be billed when office clinical staff spend 20 minutes or more in a month to coordinate care. Only one practitioner may furnish and bill the service in a calendar month. The patient may stop CCM services at any time (effective at the end of the month) by phone call to the office staff. The patient will be responsible for cost sharing (co-pay) of up to 20% of the service fee (after annual deductible is met).  Patient agreed to services and verbal consent obtained.   The patient verbalized understanding of instructions, educational materials, and care plan provided today and agreed to receive a mailed copy of patient instructions, educational materials, and care plan.   Telephone follow up appointment with care management team member scheduled for: 1 month  CLINICAL CARE PLAN: Patient Care Plan: Medication Management     Problem Identified: HTN, HLD, Tobacco use      Long-Range Goal: Disease Progression Prevention   Start Date: 11/24/2020  This Visit's Progress: On track  Priority: High  Note:   Current Barriers:  None at present  Pharmacist Clinical  Goal(s):  Over the next 30 days, patient will maintain control of chronic conditions as evidenced by medication fill history, vital signs, and lab values   through collaboration with PharmD and provider.   Interventions: 1:1 collaboration with Emeterio Reeve, DO regarding development and update of comprehensive plan of care as evidenced by provider attestation and co-signature Inter-disciplinary care team collaboration (see longitudinal plan of care) Comprehensive medication review performed; medication list updated in electronic medical record  Hypertension:  Controlled; current treatment:carvedilol 25mg  BID, hydralazine 100mg  BID, imdur 30mg  daily, losartan 100mg  daily;   Current home readings: not currently checking  Reports hypertensive symptoms particularly when stressed or frustrated  Educated on mechanism of action & benefits of each medication Recommended continue current regimen, check BP at home 1x daily and document for future visits in order to confirm BP is well controlled versus white coat hypertension phenomenon,  Hyperlipidemia:  Controlled; current treatment:atorvastatin 40mg ;   Recommended continue current regimen Tobacco Abuse:  1/2 packs per day; does smoke within 30 minutes of waking up  Previous quit attempts: unsuccessful using patch  Triggers to smoke: when others are smoking  Motivation to quit smoking: health benefits  Counseled on various methods for aiding efforts to stop smoking, as well as side effects and how to properly use Recommended either 4mg  of OTC nicotine lozenge every 1-2hours, or starting with 14mg  patch. Patient is debating trying either of these two methods.   Patient Goals/Self-Care Activities Over the next 30 days, patient will:  check blood pressure daily, document, and provide  at future appointments and attempt smoking cessation with either lozenge or patch  Follow Up Plan: Telephone follow up appointment with care management team member  scheduled for:  1 month

## 2020-12-11 DIAGNOSIS — I429 Cardiomyopathy, unspecified: Secondary | ICD-10-CM | POA: Diagnosis not present

## 2020-12-18 DIAGNOSIS — I428 Other cardiomyopathies: Secondary | ICD-10-CM | POA: Diagnosis not present

## 2020-12-18 DIAGNOSIS — N183 Chronic kidney disease, stage 3 unspecified: Secondary | ICD-10-CM | POA: Diagnosis not present

## 2020-12-18 DIAGNOSIS — I10 Essential (primary) hypertension: Secondary | ICD-10-CM | POA: Diagnosis not present

## 2020-12-18 DIAGNOSIS — Z95810 Presence of automatic (implantable) cardiac defibrillator: Secondary | ICD-10-CM | POA: Diagnosis not present

## 2020-12-18 DIAGNOSIS — I714 Abdominal aortic aneurysm, without rupture: Secondary | ICD-10-CM | POA: Diagnosis not present

## 2020-12-18 DIAGNOSIS — I447 Left bundle-branch block, unspecified: Secondary | ICD-10-CM | POA: Diagnosis not present

## 2020-12-18 DIAGNOSIS — I5022 Chronic systolic (congestive) heart failure: Secondary | ICD-10-CM | POA: Diagnosis not present

## 2020-12-19 ENCOUNTER — Encounter: Payer: Self-pay | Admitting: Physician Assistant

## 2020-12-19 DIAGNOSIS — R7309 Other abnormal glucose: Secondary | ICD-10-CM | POA: Insufficient documentation

## 2020-12-19 LAB — CMP14+EGFR
ALT: 14 IU/L (ref 0–44)
AST: 12 IU/L (ref 0–40)
Albumin/Globulin Ratio: 1.7 (ref 1.2–2.2)
Albumin: 4 g/dL (ref 3.8–4.8)
Alkaline Phosphatase: 65 IU/L (ref 44–121)
BUN/Creatinine Ratio: 17 (ref 10–24)
BUN: 20 mg/dL (ref 8–27)
Bilirubin Total: 0.4 mg/dL (ref 0.0–1.2)
CO2: 22 mmol/L (ref 20–29)
Calcium: 8.6 mg/dL (ref 8.6–10.2)
Chloride: 106 mmol/L (ref 96–106)
Creatinine, Ser: 1.17 mg/dL (ref 0.76–1.27)
Globulin, Total: 2.4 g/dL (ref 1.5–4.5)
Glucose: 102 mg/dL — ABNORMAL HIGH (ref 65–99)
Potassium: 4.3 mmol/L (ref 3.5–5.2)
Sodium: 142 mmol/L (ref 134–144)
Total Protein: 6.4 g/dL (ref 6.0–8.5)
eGFR: 68 mL/min/{1.73_m2} (ref >59)

## 2020-12-19 LAB — PSA TOTAL (REFLEX TO FREE): Prostate Specific Ag, Serum: 4 ng/mL (ref 0.0–4.0)

## 2020-12-19 LAB — CBC
Hematocrit: 43.9 % (ref 37.5–51.0)
Hemoglobin: 15 g/dL (ref 13.0–17.7)
MCH: 31.6 pg (ref 26.6–33.0)
MCHC: 34.2 g/dL (ref 31.5–35.7)
MCV: 93 fL (ref 79–97)
Platelets: 223 10*3/uL (ref 150–450)
RBC: 4.74 x10E6/uL (ref 4.14–5.80)
RDW: 13.3 % (ref 11.6–15.4)
WBC: 7.6 10*3/uL (ref 3.4–10.8)

## 2020-12-19 LAB — LIPID PANEL
Chol/HDL Ratio: 2.8 ratio (ref 0.0–5.0)
Cholesterol, Total: 116 mg/dL (ref 100–199)
HDL: 42 mg/dL (ref >39)
LDL Chol Calc (NIH): 49 mg/dL (ref 0–99)
Triglycerides: 147 mg/dL (ref 0–149)
VLDL Cholesterol Cal: 25 mg/dL (ref 5–40)

## 2020-12-19 LAB — FPSA% REFLEX
% FREE PSA: 14.3 %
PSA, FREE: 0.57 ng/mL

## 2020-12-21 ENCOUNTER — Ambulatory Visit (INDEPENDENT_AMBULATORY_CARE_PROVIDER_SITE_OTHER): Payer: Medicare Other | Admitting: Pharmacist

## 2020-12-21 ENCOUNTER — Other Ambulatory Visit: Payer: Self-pay

## 2020-12-21 DIAGNOSIS — I10 Essential (primary) hypertension: Secondary | ICD-10-CM

## 2020-12-21 DIAGNOSIS — Z720 Tobacco use: Secondary | ICD-10-CM

## 2020-12-21 NOTE — Progress Notes (Signed)
Chronic Care Management Pharmacy Note  12/21/2020 Name:  Anthony Pierce MRN:  [id number] DOB:  1951-05-26  Summary: addressed smoking cessation, HTN, HLD. Patient chose a quite date of December 18, 2020 and is day 4 of no smoking & doing well w/patch!  Recommendations/Changes made from today's visit: Check BP 2x per week, write down, and provide for next appt, where we may see BP reductions with smoking cessation. Patient has a goal to decrease doses of BP medicine or be able to come off of some.  Plan: f/u with pharmacist in 1 month  Subjective: Anthony Pierce is an 70 y.o. year old male who is a primary patient of Anthony Reeve, DO.  The CCM team was consulted for assistance with disease management and care coordination needs.    Engaged with patient by telephone for follow up visit in response to provider referral for pharmacy case management and/or care coordination services.   Consent to Services:  The patient was given information about Chronic Care Management services, agreed to services, and gave verbal consent prior to initiation of services.  Please see initial visit note for detailed documentation.   Patient Care Team: Anthony Reeve, DO as PCP - General (Osteopathic Medicine) Darius Bump, [facility] as Pharmacist (Pharmacist)   Objective:  Lab Results  Component Value Date   CREATININE 1.17 12/18/2020   CREATININE 1.34 (H) 11/18/2019   CREATININE 1.43 (H) 03/03/2019       Component Value Date/Time   CHOL 116 12/18/2020 1431   TRIG 147 12/18/2020 1431   HDL 42 12/18/2020 1431   CHOLHDL 2.8 12/18/2020 1431   CHOLHDL 2.7 11/18/2019 1310   LDLCALC 49 12/18/2020 1431   LDLCALC 59 11/18/2019 1310    Hepatic Function Latest Ref Rng & Units 12/18/2020 11/18/2019 03/03/2019  Total Protein 6.0 - 8.5 g/dL 6.4 6.2 6.5  Albumin 3.8 - 4.8 g/dL 4.0 - -  AST 0 - 40 IU/L _0 ALT 0 - 44 IU/L _1 Alk Phosphatase 44 - 121 IU/L 65 - -  Total Bilirubin 0.0 - 1.2 mg/dL  0.4 0.7 0.7     CBC Latest Ref Rng & Units 12/18/2020 11/18/2019 03/03/2019  WBC 3.4 - 10.8 x10E3/uL 7.6 6.6 6.7  Hemoglobin 13.0 - 17.7 g/dL 15.0 14.2 15.0  Hematocrit 37.5 - 51.0 % 43.9 42.5 45.3  Platelets 150 - 450 x10E3/uL 223 203 242    Social History   Tobacco Use  Smoking Status Every Day   Packs/day: 0.50   Types: Cigarettes   Last attempt to quit: 05/02/2017   Years since quitting: 3.6  Smokeless Tobacco Never   BP Readings from Last 3 Encounters:  11/16/20 (!) 171/109  10/20/20 113/77  11/23/19 131/80   Pulse Readings from Last 3 Encounters:  11/16/20 86  10/20/20 87  11/23/19 71   Wt Readings from Last 3 Encounters:  11/16/20 241 lb 11.2 oz (109.6 kg)  10/20/20 244 lb 1.3 oz (110.7 kg)  11/23/19 244 lb (110.7 kg)    Assessment: Review of patient past medical history, allergies, medications, health status, including review of consultants reports, laboratory and other test data, was performed as part of comprehensive evaluation and provision of chronic care management services.   SDOH:  (Social Determinants of Health) assessments and interventions performed:    CCM Care Plan  Allergies  Allergen Reactions   Penicillins Rash and Other (See Comments)    Has patient had a PCN reaction causing immediate rash,  facial/tongue/throat swelling, SOB or lightheadedness with hypotension: Yes Has patient had a PCN reaction causing severe rash involving mucus membranes or skin necrosis: No Has patient had a PCN reaction that required hospitalization No Has patient had a PCN reaction occurring within the last 10 years: No If all of the above answers are "NO", then may proceed with Cephalosporin use.       Medications Reviewed Today     Reviewed by Darius Bump, [facility] (Pharmacist) on 11/24/20 at [place] List Status: <None>   Medication Order Taking? Sig Documenting Provider Last Dose Status Informant  ALPRAZolam (XANAX) 0.5 MG tablet [id number] Yes TAKE 1 TABLET  BY MOUTH TWICE DAILY AS NEEDED FOR ANXIETY, SPARING USE TO PREVENT TOLERANCE/ DEPENDENCE Anthony Reeve, DO Taking Active   atorvastatin (LIPITOR) 40 MG tablet [id number] Yes Take 1 tablet (40 mg total) by mouth daily. Anthony Reeve, DO Taking Active   carvedilol (COREG) 25 MG tablet [id number] Yes TAKE 1 TABLET(25 MG) BY MOUTH TWICE DAILY WITH A MEAL Anthony Reeve, DO Taking Active   ciclopirox (PENLAC) 8 % solution [id number] No Apply topically at bedtime. Apply over nail and surrounding skin. Apply daily over previous coat. After seven (7) days, may remove with alcohol and continue cycle.  Patient not taking: Reported on 11/24/2020   Anthony Reeve, DO Not Taking Consider Medication Status and Discontinue   clopidogrel (PLAVIX) 75 MG tablet [id number] Yes Take 1 tablet (75 mg total) by mouth daily. Anthony Reeve, DO Taking Active   hydrALAZINE (APRESOLINE) 100 MG tablet [id number] Yes Take 1 tablet (100 mg total) by mouth 2 (two) times daily. Anthony Reeve, DO Taking Active   ibuprofen (ADVIL,MOTRIN) 200 MG tablet [id number] Yes Take 400 mg by mouth every 6 (six) hours as needed for mild pain. [provider] Taking Active   isosorbide mononitrate (IMDUR) 30 MG 24 hr tablet [id number] Yes Take 1 tablet (30 mg total) by mouth daily. Anthony Reeve, DO Taking Active   losartan (COZAAR) 100 MG tablet [id number] Yes Take 1 tablet (100 mg total) by mouth daily. Anthony Reeve, DO Taking Active   traMADol (ULTRAM) 50 MG tablet [id number] No SMARTSIG:1 By Mouth 4-5 Times Daily  Patient not taking: Reported on 11/24/2020   [provider] Not Taking Active             Patient Active Problem List   Diagnosis Date Noted   Elevated glucose level 12/19/2020   Advance directive discussed with patient 11/23/2019   CKD (chronic kidney disease) stage 3, GFR 30-59 ml/min (HCC) 11/19/2017   Situational anxiety 05/08/2017   LBBB (left bundle branch block)  04/15/2017   Nonischemic cardiomyopathy ([place]) 85/88/5027   Chronic systolic congestive heart failure ([place]) 04/14/2017   Essential hypertension 04/14/2017   Nephrolithiasis 07/11/2016   AAA (abdominal aortic aneurysm) ([place]) 03/27/2016   Tobacco abuse 07/09/2012    Immunization History  Administered Date(s) Administered   Tdap 11/19/2017    Conditions to be addressed/monitored: HTN, HLD, and tobacco use  Care Plan : Medication Management  Updates made by Darius Bump, [place] since 12/21/2020 12:00 AM     Problem: HTN, HLD, Tobacco use      Long-Range Goal: Disease Progression Prevention   Start Date: 11/24/2020  Recent Progress: On track  Priority: High  Note:   Current Barriers:  None at present  Pharmacist Clinical Goal(s):  Over the next 30 days, patient will maintain control of chronic conditions as evidenced by medication fill history, vital signs,  and lab values   through collaboration with PharmD and provider.   Interventions: 1:1 collaboration with Anthony Reeve, DO regarding development and update of comprehensive plan of care as evidenced by provider attestation and co-signature Inter-disciplinary care team collaboration (see longitudinal plan of care) Comprehensive medication review performed; medication list updated in electronic medical record  Hypertension:  Controlled; current treatment:carvedilol 33m BID, hydralazine 1071mBID, imdur 3064maily, losartan 100m97mily;   Current home readings: not currently checking  Previously reports hypertensive symptoms particularly when stressed or frustrated  Educated on mechanism of action & benefits of each medication Recommended continue current regimen, check BP at home 2x per week and document for future visits in order to confirm BP is well controlled versus white coat hypertension phenomenon,  Hyperlipidemia:  Controlled; current treatment:atorvastatin 40mg65mRecommended continue current regimen Tobacco  Abuse:  Quite date: December 18, 2020!  1/2 packs per day; does smoke within 30 minutes of waking up  Previous quit attempts: unsuccessful using patch  Triggers to smoke: when others are smoking  Motivation to quit smoking: health benefits  Counseled on various methods for aiding efforts to stop smoking, as well as side effects and how to properly use Recommended either 4mg o56mTC nicotine lozenge every 1-2hours, or starting with 14mg p74m. Patient began with patch and is doing well so far!  Patient Goals/Self-Care Activities Over the next 30 days, patient will:  check blood pressure 2x per week, document, and provide at future appointments and continue smoking cessation with patch  Follow Up Plan: Telephone follow up appointment with care management team member scheduled for:  1 month      Medication Assistance: None required.  Patient affirms current coverage meets needs.  [id number] [id number] [phone] box? Yes Pt endorses 100% compliance  Follow Up:  Patient agrees to Care Plan and Follow-up.  Plan: Telephone follow up appointment with care management team member scheduled for:  1 month  Vonzell Lindblad Darius Bump

## 2020-12-21 NOTE — Patient Instructions (Signed)
Visit Information  PATIENT GOALS:  Goals Addressed             This Visit's Progress    Medication Management       Patient Goals/Self-Care Activities Over the next 30 days, patient will:  check blood pressure 2x per week, document, and provide at future appointments and continue smoking cessation with patch  Follow Up Plan: Telephone follow up appointment with care management team member scheduled for:  1 month          The patient verbalized understanding of instructions, educational materials, and care plan provided today and agreed to receive a mailed copy of patient instructions, educational materials, and care plan.   Telephone follow up appointment with care management team member scheduled for: 1 month  Anthony Pierce

## 2021-01-10 DIAGNOSIS — I77810 Thoracic aortic ectasia: Secondary | ICD-10-CM | POA: Diagnosis not present

## 2021-01-10 DIAGNOSIS — I712 Thoracic aortic aneurysm, without rupture: Secondary | ICD-10-CM | POA: Diagnosis not present

## 2021-01-10 DIAGNOSIS — I5022 Chronic systolic (congestive) heart failure: Secondary | ICD-10-CM | POA: Diagnosis not present

## 2021-01-10 DIAGNOSIS — I428 Other cardiomyopathies: Secondary | ICD-10-CM | POA: Diagnosis not present

## 2021-01-10 DIAGNOSIS — I517 Cardiomegaly: Secondary | ICD-10-CM | POA: Diagnosis not present

## 2021-01-10 DIAGNOSIS — Z95810 Presence of automatic (implantable) cardiac defibrillator: Secondary | ICD-10-CM | POA: Diagnosis not present

## 2021-01-12 ENCOUNTER — Ambulatory Visit (INDEPENDENT_AMBULATORY_CARE_PROVIDER_SITE_OTHER): Payer: Medicare Other | Admitting: Pharmacist

## 2021-01-12 ENCOUNTER — Other Ambulatory Visit: Payer: Self-pay

## 2021-01-12 DIAGNOSIS — I10 Essential (primary) hypertension: Secondary | ICD-10-CM | POA: Diagnosis not present

## 2021-01-12 DIAGNOSIS — Z720 Tobacco use: Secondary | ICD-10-CM

## 2021-01-12 NOTE — Progress Notes (Signed)
Chronic Care Management Pharmacy Note  01/12/2021 Name:  Anthony Pierce MRN:  [id number] DOB:  [birth year]  Summary: addressed HTN, HLD, smoking cessation  Recommendations/Changes made from today's visit: patient making EXCELLENT progress - continue with patch & hard candies for smoking cessation, also reinforced patient to check BP at home and write down numbers for next discussion. Patient goal is to reduce BP meds if possible.  Plan: f/u with pharmacist in 1 month  Subjective: Anthony Pierce is an 70 y.o. year old male who is a primary patient of Emeterio Reeve, DO.  The CCM team was consulted for assistance with disease management and care coordination needs.    Engaged with patient by telephone for follow up visit in response to provider referral for pharmacy case management and/or care coordination services.   Consent to Services:  The patient was given information about Chronic Care Management services, agreed to services, and gave verbal consent prior to initiation of services.  Please see initial visit note for detailed documentation.   Patient Care Team: Emeterio Reeve, DO as PCP - General (Osteopathic Medicine) Darius Bump, [facility] as Pharmacist (Pharmacist)  Objective:  Lab Results  Component Value Date   CREATININE 1.17 12/18/2020   CREATININE 1.34 (H) 11/18/2019   CREATININE 1.43 (H) 03/03/2019       Component Value Date/Time   CHOL 116 12/18/2020 1431   TRIG 147 12/18/2020 1431   HDL 42 12/18/2020 1431   CHOLHDL 2.8 12/18/2020 1431   CHOLHDL 2.7 11/18/2019 1310   LDLCALC 49 12/18/2020 1431   LDLCALC 59 11/18/2019 1310    Hepatic Function Latest Ref Rng & Units 12/18/2020 11/18/2019 03/03/2019  Total Protein 6.0 - 8.5 g/dL 6.4 6.2 6.5  Albumin 3.8 - 4.8 g/dL 4.0 - -  AST 0 - 40 IU/L $Remov'12 11 13  'aqFXtt$ ALT 0 - 44 IU/L $Remov'14 11 11  'Jacjkn$ Alk Phosphatase 44 - 121 IU/L 65 - -  Total Bilirubin 0.0 - 1.2 mg/dL 0.4 0.7 0.7    No results found for: TSH, FREET4  CBC  Latest Ref Rng & Units 12/18/2020 11/18/2019 03/03/2019  WBC 3.4 - 10.8 x10E3/uL 7.6 6.6 6.7  Hemoglobin 13.0 - 17.7 g/dL 15.0 14.2 15.0  Hematocrit 37.5 - 51.0 % 43.9 42.5 45.3  Platelets 150 - 450 x10E3/uL 223 203 242    Social History   Tobacco Use  Smoking Status Every Day   Packs/day: 0.50   Types: Cigarettes   Last attempt to quit: 05/02/2017   Years since quitting: 3.7  Smokeless Tobacco Never   BP Readings from Last 3 Encounters:  11/16/20 (!) 171/109  10/20/20 113/77  11/23/19 131/80   Pulse Readings from Last 3 Encounters:  11/16/20 86  10/20/20 87  11/23/19 71   Wt Readings from Last 3 Encounters:  11/16/20 241 lb 11.2 oz (109.6 kg)  10/20/20 244 lb 1.3 oz (110.7 kg)  11/23/19 244 lb (110.7 kg)    Assessment: Review of patient past medical history, allergies, medications, health status, including review of consultants reports, laboratory and other test data, was performed as part of comprehensive evaluation and provision of chronic care management services.   SDOH:  (Social Determinants of Health) assessments and interventions performed:    CCM Care Plan  Allergies  Allergen Reactions   Penicillins Rash and Other (See Comments)    Has patient had a PCN reaction causing immediate rash, facial/tongue/throat swelling, SOB or lightheadedness with hypotension: Yes Has patient had a PCN reaction  causing severe rash involving mucus membranes or skin necrosis: No Has patient had a PCN reaction that required hospitalization No Has patient had a PCN reaction occurring within the last 10 years: No If all of the above answers are "NO", then may proceed with Cephalosporin use.       Medications Reviewed Today     Reviewed by Darius Bump, [facility] (Pharmacist) on 01/12/21 at 1027  Med List Status: <None>   Medication Order Taking? Sig Documenting Provider Last Dose Status Informant  ALPRAZolam (XANAX) 0.5 MG tablet [id number] No TAKE 1 TABLET BY MOUTH TWICE DAILY AS  NEEDED FOR ANXIETY, SPARING USE TO PREVENT TOLERANCE/ DEPENDENCE Emeterio Reeve, DO Taking Active   atorvastatin (LIPITOR) 40 MG tablet [id number] No Take 1 tablet (40 mg total) by mouth daily. Emeterio Reeve, DO Taking Active   carvedilol (COREG) 25 MG tablet [id number] No TAKE 1 TABLET(25 MG) BY MOUTH TWICE DAILY WITH A MEAL Emeterio Reeve, DO Taking Active   ciclopirox (PENLAC) 8 % solution [id number] No Apply topically at bedtime. Apply over nail and surrounding skin. Apply daily over previous coat. After seven (7) days, may remove with alcohol and continue cycle.  Patient not taking: No sig reported   Emeterio Reeve, DO Not Taking Active   clopidogrel (PLAVIX) 75 MG tablet [id number] No Take 1 tablet (75 mg total) by mouth daily. Emeterio Reeve, DO Taking Active   hydrALAZINE (APRESOLINE) 100 MG tablet [id number] No Take 1 tablet (100 mg total) by mouth 2 (two) times daily. Emeterio Reeve, DO Taking Active   ibuprofen (ADVIL,MOTRIN) 200 MG tablet [id number] No Take 400 mg by mouth every 6 (six) hours as needed for mild pain. [provider] Taking Active   isosorbide mononitrate (IMDUR) 30 MG 24 hr tablet [id number] No Take 1 tablet (30 mg total) by mouth daily. Emeterio Reeve, DO Taking Active   losartan (COZAAR) 100 MG tablet [id number] No Take 1 tablet (100 mg total) by mouth daily. Emeterio Reeve, DO Taking Active   nicotine (NICODERM CQ - DOSED IN MG/24 HOURS) 14 mg/24hr patch [id number] No Place 14 mg onto the skin daily. [provider] Taking Active   traMADol (ULTRAM) 50 MG tablet [id number] No SMARTSIG:1 By Mouth 4-5 Times Daily  Patient not taking: No sig reported   [provider] Not Taking Active             Patient Active Problem List   Diagnosis Date Noted   Elevated glucose level 12/19/2020   Advance directive discussed with patient 11/23/2019   CKD (chronic kidney disease) stage 3, GFR 30-59 ml/min (HCC) 11/19/2017    Situational anxiety 05/08/2017   LBBB (left bundle branch block) 04/15/2017   Nonischemic cardiomyopathy ([place]) 43/27/6147   Chronic systolic congestive heart failure ([place]) 04/14/2017   Essential hypertension 04/14/2017   Nephrolithiasis 07/11/2016   AAA (abdominal aortic aneurysm) ([place]) 03/27/2016   Tobacco abuse 07/09/2012    Immunization History  Administered Date(s) Administered   Tdap 11/19/2017    Conditions to be addressed/monitored: HTN, HLD, and tobacco use  Care Plan : Medication Management  Updates made by Darius Bump, [place] since 01/12/2021 12:00 AM     Problem: HTN, HLD, Tobacco use      Long-Range Goal: Disease Progression Prevention   Start Date: 11/24/2020  Recent Progress: On track  Priority: High  Note:   Current Barriers:  None at present  Pharmacist Clinical Goal(s):  Over the next 30 days, patient will maintain control of chronic  conditions as evidenced by medication fill history, vital signs, and lab values   through collaboration with PharmD and provider.   Interventions: 1:1 collaboration with Emeterio Reeve, DO regarding development and update of comprehensive plan of care as evidenced by provider attestation and co-signature Inter-disciplinary care team collaboration (see longitudinal plan of care) Comprehensive medication review performed; medication list updated in electronic medical record  Hypertension:  Controlled; current treatment:carvedilol 25mg  BID, hydralazine 100mg  BID, imdur 30mg  daily, losartan 100mg  daily;   Current home readings: not currently checking  Previously reports hypertensive symptoms particularly when stressed or frustrated  Educated on mechanism of action & benefits of each medication Recommended continue current regimen, check BP at home 2x per week and document for future visits in order to confirm BP is well controlled versus white coat hypertension phenomenon,  Hyperlipidemia:  Controlled; current  treatment:atorvastatin 40mg ;   Recommended continue current regimen Tobacco Abuse:  Quite date: December 18, 2020!  Previous history: 1/2 packs per day; does smoke within 30 minutes of waking up  Previous quit attempts: unsuccessful using patch  Triggers to smoke: when others are smoking  Motivation to quit smoking: health benefits  Previously counseled on various methods for aiding efforts to stop smoking, as well as side effects and how to properly use Prevously recommended either 4mg  of OTC nicotine lozenge every 1-2hours, or starting with 14mg  patch. Patient is using 14mg  patch and hard candies and is still smoke free!!  Patient Goals/Self-Care Activities Over the next 30 days, patient will:  check blood pressure 2x per week, document, and provide at future appointments and continue smoking cessation with patch  Follow Up Plan: Telephone follow up appointment with care management team member scheduled for:  1 month      Medication Assistance: None required.  Patient affirms current coverage meets needs.  Patient's preferred pharmacy is:  [id number] - [place], [address] [place] [address] Phone: [phone] Fax: [phone]  Uses pill box? Yes Pt endorses 100% compliance  Follow Up:  Patient agrees to Care Plan and Follow-up.  Plan: Telephone follow up appointment with care management team member scheduled for:  1 month  Darius Bump

## 2021-01-12 NOTE — Patient Instructions (Signed)
Visit Information  PATIENT GOALS:  Goals Addressed             This Visit's Progress    Medication Management       Patient Goals/Self-Care Activities Over the next 30 days, patient will:  check blood pressure 2x per week, document, and provide at future appointments and continue smoking cessation with patch  Follow Up Plan: Telephone follow up appointment with care management team member scheduled for:  1 month           The patient verbalized understanding of instructions, educational materials, and care plan provided today and agreed to receive a mailed copy of patient instructions, educational materials, and care plan.   Telephone follow up appointment with care management team member scheduled for: 1 month   Anthony Pierce

## 2021-01-25 ENCOUNTER — Other Ambulatory Visit: Payer: Self-pay | Admitting: Osteopathic Medicine

## 2021-02-08 DIAGNOSIS — I517 Cardiomegaly: Secondary | ICD-10-CM | POA: Diagnosis not present

## 2021-02-08 DIAGNOSIS — R918 Other nonspecific abnormal finding of lung field: Secondary | ICD-10-CM | POA: Diagnosis not present

## 2021-02-08 DIAGNOSIS — I712 Thoracic aortic aneurysm, without rupture: Secondary | ICD-10-CM | POA: Diagnosis not present

## 2021-02-09 ENCOUNTER — Telehealth: Payer: Medicare Other

## 2021-02-16 ENCOUNTER — Telehealth: Payer: Medicare Other

## 2021-02-27 ENCOUNTER — Telehealth: Payer: Self-pay

## 2021-02-27 NOTE — Telephone Encounter (Signed)
Patient left a vm msg with concerns regarding skin cancer. Patient is requesting an appointment for an evaluation and Dermatology referral. Please contact the patient to schedule an appointment. Thanks in advance.

## 2021-02-28 NOTE — Telephone Encounter (Signed)
Scheduled patient with Samuel Bouche for appt & to transfer from Dr A. AM

## 2021-03-09 ENCOUNTER — Ambulatory Visit: Payer: Medicare Other | Admitting: Medical-Surgical

## 2021-03-15 ENCOUNTER — Other Ambulatory Visit: Payer: Self-pay

## 2021-03-15 ENCOUNTER — Encounter: Payer: Self-pay | Admitting: Family Medicine

## 2021-03-15 ENCOUNTER — Ambulatory Visit (INDEPENDENT_AMBULATORY_CARE_PROVIDER_SITE_OTHER): Payer: Medicare Other | Admitting: Family Medicine

## 2021-03-15 ENCOUNTER — Other Ambulatory Visit: Payer: Self-pay | Admitting: Family Medicine

## 2021-03-15 DIAGNOSIS — D485 Neoplasm of uncertain behavior of skin: Secondary | ICD-10-CM | POA: Diagnosis not present

## 2021-03-15 NOTE — Patient Instructions (Signed)
Basal Cell Carcinoma Basal cell carcinoma is the most common form of skin cancer. It begins in the basal cells, which are at the bottom of the outer skin layer (epidermis). Basal cell carcinoma can almost always be cured. It rarely spreads to other areas of the body (metastasizes). It may come back at the same location (recur), but it can be treated again if this happens. Basal cell carcinoma occurs most often on parts of the body that are frequently exposed to the sun, such as: Parts of the head, including the scalp or face. Ears. Neck. Arms or legs. Backs of the hands. What are the causes? This condition is usually caused by exposure to ultraviolet (UV) light. UV light may come from the sun or from tanning beds. Other causes include: Exposure to a highly poisonous metal (arsenic). Exposure to high-energy X-rays (radiation). Exposure to toxic tars and oils. Certain genetic conditions, such as a condition that makes a person sensitive to sunlight (xeroderma pigmentosum). What increases the risk? You are more likely to develop this condition if: You are older than 68 years of age. You have: Fair skin (light complexion). Blond or red hair. Blue, green, or gray eyes. Childhood freckling. Had sun exposure over long periods of time, especially during childhood. Had repeated sunburns. A weakened immune system. Been exposed to certain chemicals, such as tar, soot, and arsenic. Chronic inflammatory conditions. Chronic infections. You use tanning beds. What are the signs or symptoms? The main symptom of this condition is a growth or lesion on the skin. The shape and color of the growth or lesion may vary. The main types include: An open sore that may remain open for 3 weeks or longer. The sore may bleed or crust. This type of lesion can be an early sign of basal cell carcinoma. Basal cell carcinoma often shows up as a sore that does not heal. A reddish area that may crust, itch, or cause  discomfort. This may occur on areas that are exposed to the sun. These patches might be easier to feel than to see. A shiny or clear bump that is red, white, or pink. In people who have dark hair, the bump is often tan, black, or brown. These bumps can look like moles. A pink growth with a raised border. The growth will have a crusted and indented area in the center. Small blood vessels may appear on the surface of the growth as it gets bigger. A scar-like area that looks like shiny, stretched skin. The area may be white, yellow, or waxy. It often has irregular borders. This may be a sign of more aggressive basal cell carcinoma. How is this diagnosed? This condition may be diagnosed with: A physical exam. Removal of a tissue sample to be examined under a microscope (biopsy). How is this treated? Treatment for this condition involves removing the cancerous tissue. The method that is used for this depends on the type, size, location, and number of tumors. Possible treatments include: Mohs surgery. In this procedure, the cancerous skin cells are removed layer by layer until all of the tumor has been removed. Surgical removal (excision) of the tumor. This involves removing the entire tumor and a small amount of normal skin that surrounds it. Cryosurgery. This involves freezing the tumor with liquid nitrogen. Plastic surgery. The tumor is removed, and healthy skin from another part of the body is used to cover the wound. This may be done for large tumors that are in areas where it is not  possible to stretch the nearby skin to sew the edges of the wound together. Radiation. This may be used for tumors on the face. Photodynamic therapy. A chemical cream is applied to the skin, and light exposure is used to activate the chemical. Electrodesiccation and curettage. This involves alternately scraping and burning the tumor while using an electric current to control bleeding. Chemical treatments, such as  imiquimod cream and interferon injections. These may be used to remove superficial tumors with minimal scarring. Follow these instructions at home: Avoid direct exposure to the sun. Do self-exams as told by your health care provider. Look for new spots or changes in your skin. Keep all follow-up visits as told by your health care provider. This is important. How is this prevented?  Avoid the sun when it is the strongest. This is usually between 10 a.m. and 4 p.m. When you are out in the sun, use a sunscreen that has a sun protection factor (SPF) of at least 20. Apply sunscreen at least 30 minutes before exposure to the sun. Reapply sunscreen every 2-4 hours while you are outside. Also reapply it after swimming and after excessive sweating. Always wear hats, protective clothing, and UV-blocking sunglasses when you are outdoors. Do not use tanning beds. Contact a health care provider if you: Notice any new spots or any changes in your skin. Have had a basal cell carcinoma tumor removed, and you notice a new growth in the same location. Get help right away if you have a spot that: Is sore and does not heal. Bleeds easily with minor injury. Summary Basal cell carcinoma is the most common form of skin cancer. It begins in the bottom of the outer skin layer (epidermis). Basal cell carcinoma can almost always be cured. This condition is usually caused by exposure to ultraviolet (UV) light. It mostly affects the face, scalp, neck, ears, arms, legs, or backs of the hands. The main symptom of this condition is a growth or lesion on the skin that can vary in shape and color. You can prevent this cancer by avoiding direct exposure to the sun, applying sunscreen of at least 30 SPF, and wearing protective clothing. Apply sunscreen 30 minutes before you go out into the sun, and reapply every 2-4 hours while you are outside. This information is not intended to replace advice given to you by your health care  provider. Make sure you discuss any questions you have with your health care provider. Document Revised: 10/08/2017 Document Reviewed: 10/08/2017 Elsevier Patient Education  Anthony Pierce.

## 2021-03-15 NOTE — Addendum Note (Signed)
Addended by: Perlie Mayo on: 03/15/2021 03:47 PM   Modules accepted: Orders

## 2021-03-15 NOTE — Progress Notes (Signed)
Anthony Pierce - 70 y.o. male MRN [id number]  Date of birth: 1951/02/02  Subjective Chief Complaint  Patient presents with   Referral    HPI Anthony Pierce is a 70 year old male here today with concern of skin lesion.  He has a lesion on the left nare that has been there for several months.  Area with some ulceration and he reports occasional bleeding.  He does not have significant pain associated with this.  ROS:  A comprehensive ROS was completed and negative except as noted per HPI  Allergies  Allergen Reactions   Penicillins Rash and Other (See Comments)    Has patient had a PCN reaction causing immediate rash, facial/tongue/throat swelling, SOB or lightheadedness with hypotension: Yes Has patient had a PCN reaction causing severe rash involving mucus membranes or skin necrosis: No Has patient had a PCN reaction that required hospitalization No Has patient had a PCN reaction occurring within the last 10 years: No If all of the above answers are "NO", then may proceed with Cephalosporin use.       Past Medical History:  Diagnosis Date   CHF (congestive heart failure) (HCC)    Diverticulosis    Dysrhythmia    heart palpitations infrequent   Hemorrhoids    History of AAA (abdominal aortic aneurysm) repair    History of kidney stones    Hyperlipidemia    Hypertension    Kidney stones     Past Surgical History:  Procedure Laterality Date   ABDOMINAL AORTIC ENDOVASCULAR STENT GRAFT Bilateral 03/27/2016   Procedure: ABDOMINAL AORTIC ENDOVASCULAR STENT GRAFT;  Surgeon: Serafina Mitchell, MD;  Location: [facility] OR;  Service: Vascular;  Laterality: Bilateral;   CARDIAC DEFIBRILLATOR PLACEMENT     COLONSCOPY  AGE 20   LITHOTRIPSY     NEPHROLITHOTOMY Left 07/11/2016   Procedure: NEPHROLITHOTOMY PERCUTANEOUS WITH SURGEON ACESS;  Surgeon: Ardis Hughs, MD;  Location: [facility];  Service: Urology;  Laterality: Left;    Social History   Socioeconomic History   Marital status: Divorced     Spouse name: Not on file   Number of children: 2   Years of education: 14   Highest education level: Associate degree: academic program  Occupational History   Occupation: Retired    Comment: Programme researcher, 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 Likes to play the guitar for fun. Tries to walk as much as possible.   Social Determinants of Health   Financial Resource Strain: Low Risk    Difficulty of Paying Living Expenses: Not hard at all  Food Insecurity: No Food Insecurity   Worried About Charity fundraiser in the Last Year: Never true   [place] in the Last Year: Never true  Transportation Needs: No Transportation Needs   Lack of Transportation (Medical): No   Lack of Transportation (Non-Medical): No  Physical Activity: Inactive   Days of Exercise per Week: 0 days   Minutes of Exercise per Session: 0 min  Stress: No Stress Concern Present   Feeling  of Stress : Not at all  Social Connections: Socially Isolated   Frequency of Communication with Friends and Family: Three times a week   Frequency of Social Gatherings with Friends and Family: Never   Attends Religious Services: Never   Marine scientist or Organizations: No   Attends Music therapist: Never   Marital Status: Divorced    Family History  Problem Relation Age of Onset   Cancer Father        stomach    Health Maintenance  Topic Date Due   COLONOSCOPY (Pts 45-18yrs Insurance coverage will need to be confirmed)  Never done   Hepatitis C Screening  06/14/2021 (Originally 12/12/1968)   TETANUS/TDAP  11/20/2027   HPV VACCINES  Aged Out    INFLUENZA VACCINE  Discontinued   COVID-19 Vaccine  Discontinued   Zoster Vaccines- Shingrix  Discontinued     ----------------------------------------------------------------------------------------------------------------------------------------------------------------------------------------------------------------- Physical Exam BP 138/89 (BP Location: Left Arm, Patient Position: Sitting, Cuff Size: Large)   Pulse 90   Temp 97.7 F (36.5 C)   Ht 6\' 4"  (1.93 m)   Wt 251 lb (113.9 kg)   SpO2 97%   BMI 30.55 kg/m   Physical Exam Constitutional:      Appearance: Normal appearance.  Skin:    Comments: Ulcerated area with rolled, rounded borders along the left nare.  Neurological:     Mental Status: He is alert.    ------------------------------------------------------------------------------------------------------------------------------------------------------------------------------------------------------------------- Assessment and Plan  Neoplasm of uncertain behavior of skin of face Area on nose is concerning for basal cell carcinoma.  Referral placed to dermatology.   No orders of the defined types were placed in this encounter.   No follow-ups on file.    This visit occurred during the SARS-CoV-2 public health emergency.  Safety protocols were in place, including screening questions prior to the visit, additional usage of staff PPE, and extensive cleaning of exam room while observing appropriate contact time as indicated for disinfecting solutions.

## 2021-03-15 NOTE — Assessment & Plan Note (Signed)
Area on nose is concerning for basal cell carcinoma.  Referral placed to dermatology.

## 2021-03-19 DIAGNOSIS — I447 Left bundle-branch block, unspecified: Secondary | ICD-10-CM | POA: Diagnosis not present

## 2021-03-19 DIAGNOSIS — I5022 Chronic systolic (congestive) heart failure: Secondary | ICD-10-CM | POA: Diagnosis not present

## 2021-03-19 DIAGNOSIS — Z95810 Presence of automatic (implantable) cardiac defibrillator: Secondary | ICD-10-CM | POA: Diagnosis not present

## 2021-03-19 DIAGNOSIS — N289 Disorder of kidney and ureter, unspecified: Secondary | ICD-10-CM | POA: Diagnosis not present

## 2021-03-19 DIAGNOSIS — I428 Other cardiomyopathies: Secondary | ICD-10-CM | POA: Diagnosis not present

## 2021-03-19 DIAGNOSIS — I714 Abdominal aortic aneurysm, without rupture, unspecified: Secondary | ICD-10-CM | POA: Diagnosis not present

## 2021-03-21 DIAGNOSIS — L821 Other seborrheic keratosis: Secondary | ICD-10-CM | POA: Diagnosis not present

## 2021-03-21 DIAGNOSIS — C44319 Basal cell carcinoma of skin of other parts of face: Secondary | ICD-10-CM | POA: Diagnosis not present

## 2021-03-21 DIAGNOSIS — C44311 Basal cell carcinoma of skin of nose: Secondary | ICD-10-CM | POA: Diagnosis not present

## 2021-04-23 ENCOUNTER — Ambulatory Visit: Payer: Medicare Other | Admitting: Osteopathic Medicine

## 2021-04-30 ENCOUNTER — Other Ambulatory Visit: Payer: Self-pay | Admitting: Family Medicine

## 2021-04-30 MED ORDER — ALPRAZOLAM 0.5 MG PO TABS: ORAL_TABLET | ORAL | 2 refills | Status: DC

## 2021-05-03 DIAGNOSIS — C44311 Basal cell carcinoma of skin of nose: Secondary | ICD-10-CM | POA: Diagnosis not present

## 2021-05-03 HISTORY — DX: Basal cell carcinoma of skin of nose: C44.311

## 2021-06-03 DIAGNOSIS — C44612 Basal cell carcinoma of skin of right upper limb, including shoulder: Secondary | ICD-10-CM

## 2021-06-03 HISTORY — DX: Basal cell carcinoma of skin of right upper limb, including shoulder: C44.612

## 2021-06-05 DIAGNOSIS — L821 Other seborrheic keratosis: Secondary | ICD-10-CM | POA: Diagnosis not present

## 2021-06-05 DIAGNOSIS — L570 Actinic keratosis: Secondary | ICD-10-CM | POA: Diagnosis not present

## 2021-06-05 DIAGNOSIS — C44622 Squamous cell carcinoma of skin of right upper limb, including shoulder: Secondary | ICD-10-CM | POA: Diagnosis not present

## 2021-06-18 DIAGNOSIS — I429 Cardiomyopathy, unspecified: Secondary | ICD-10-CM | POA: Diagnosis not present

## 2021-06-25 DIAGNOSIS — C44622 Squamous cell carcinoma of skin of right upper limb, including shoulder: Secondary | ICD-10-CM | POA: Diagnosis not present

## 2021-07-09 ENCOUNTER — Ambulatory Visit (INDEPENDENT_AMBULATORY_CARE_PROVIDER_SITE_OTHER): Payer: Medicare Other | Admitting: Family Medicine

## 2021-07-09 ENCOUNTER — Other Ambulatory Visit: Payer: Self-pay

## 2021-07-09 DIAGNOSIS — Z0000 Encounter for general adult medical examination without abnormal findings: Secondary | ICD-10-CM

## 2021-07-09 NOTE — Patient Instructions (Addendum)
[facility] Maintenance Summary and Written Plan of Care  [name] ,  Thank you for allowing me to perform your Medicare Annual Wellness Visit and for your ongoing commitment to your health.   Health Maintenance & Immunization History Health Maintenance  Topic Date Due   Pneumonia Vaccine 45+ Years old (1 - PCV) 07/09/2022 (Originally 12/12/1956)   COLONOSCOPY (Pts 45-87yrs Insurance coverage will need to be confirmed)  07/09/2022 (Originally 12/11/1997)   Hepatitis C Screening  07/09/2022 (Originally 12/12/1968)   TETANUS/TDAP  11/20/2027   HPV VACCINES  Aged Out   INFLUENZA VACCINE  Discontinued   COVID-19 Vaccine  Discontinued   Zoster Vaccines- Shingrix  Discontinued   Immunization History  Administered Date(s) Administered   Tdap 11/19/2017    These are the patient goals that we discussed:  Goals Addressed               This Visit's Progress     Patient Stated (pt-stated)        07/09/2021 AWV Goal: Exercise for General Health  Patient will verbalize understanding of the benefits of increased physical activity: Exercising regularly is important. It will improve your overall fitness, flexibility, and endurance. Regular exercise also will improve your overall health. It can help you control your weight, reduce stress, and improve your bone density. Over the next year, patient will increase physical activity as tolerated with a goal of at least 150 minutes of moderate physical activity per week.  You can tell that you are exercising at a moderate intensity if your heart starts beating faster and you start breathing faster but can still hold a conversation. Moderate-intensity exercise ideas include: Walking 1 mile (1.6 km) in about 15 minutes Biking Hiking Golfing Dancing Water aerobics Patient will verbalize understanding of everyday activities that increase physical activity by providing examples like the following: Yard work, such  as: Sales promotion account executive Gardening Washing windows or floors Patient will be able to explain general safety guidelines for exercising:  Before you start a new exercise program, talk with your health care provider. Anthony Pierce not exercise so much that you hurt yourself, feel dizzy, or get very short of breath. Wear comfortable clothes and wear shoes with good support. Drink plenty of water while you exercise to prevent dehydration or heat stroke. Work out until your breathing and your heartbeat get faster.          This is a list of Health Maintenance Items that are overdue or due now: Pneumococcal vaccine  Influenza vaccine Colorectal cancer screening Shingrix vaccine  Patient declined all of the vaccines at this time. Will discuss colonoscopy with provider at next visit.    Orders/Referrals Placed Today: No orders of the defined types were placed in this encounter.  (Contact our referral department at [phone] if you have not spoken with someone about your referral appointment within the next 5 days)    Follow-up Plan Follow-up with Anthony Nutting, Anthony Pierce as planned Discuss colorectal cancer screening with Dr. Zigmund Daniel at next in office visit.  Discuss low dose ct scan for lung cancer screening with Dr. Zigmund Daniel at next in office visit. Medicare wellness visit in chart. AVS printed and mailed to the patient.      Health Maintenance, Male Adopting a healthy lifestyle and getting preventive care are important in promoting health and wellness. Ask your health care provider about: The right schedule for you  to have regular tests and exams. Things you can Anthony Pierce on your own to prevent diseases and keep yourself healthy. What should I know about diet, weight, and exercise? Eat a healthy diet  Eat a diet that includes plenty of vegetables, fruits, low-fat dairy products, and lean protein. Anthony Pierce not eat a lot  of foods that are high in solid fats, added sugars, or sodium. Maintain a healthy weight Body mass index (BMI) is a measurement that can be used to identify possible weight problems. It estimates body fat based on height and weight. Your health care provider can help determine your BMI and help you achieve or maintain a healthy weight. Get regular exercise Get regular exercise. This is one of the most important things you can Anthony Pierce for your health. Most adults should: Exercise for at least 150 minutes each week. The exercise should increase your heart rate and make you sweat (moderate-intensity exercise). Anthony Pierce strengthening exercises at least twice a week. This is in addition to the moderate-intensity exercise. Spend less time sitting. Even light physical activity can be beneficial. Watch cholesterol and blood lipids Have your blood tested for lipids and cholesterol at 69 years of age, then have this test every 5 years. You may need to have your cholesterol levels checked more often if: Your lipid or cholesterol levels are high. You are older than 69 years of age. You are at high risk for heart disease. What should I know about cancer screening? Many types of cancers can be detected early and may often be prevented. Depending on your health history and family history, you may need to have cancer screening at various ages. This may include screening for: Colorectal cancer. Prostate cancer. Skin cancer. Lung cancer. What should I know about heart disease, diabetes, and high blood pressure? Blood pressure and heart disease High blood pressure causes heart disease and increases the risk of stroke. This is more likely to develop in people who have high blood pressure readings or are overweight. Talk with your health care provider about your target blood pressure readings. Have your blood pressure checked: Every 3-5 years if you are 62-94 years of age. Every year if you are 54 years old or older. If  you are between the ages of 37 and 86 and are a current or former smoker, ask your health care provider if you should have a one-time screening for abdominal aortic aneurysm (AAA). Diabetes Have regular diabetes screenings. This checks your fasting blood sugar level. Have the screening done: Once every three years after age 26 if you are at a normal weight and have a low risk for diabetes. More often and at a younger age if you are overweight or have a high risk for diabetes. What should I know about preventing infection? Hepatitis B If you have a higher risk for hepatitis B, you should be screened for this virus. Talk with your health care provider to find out if you are at risk for hepatitis B infection. Hepatitis C Blood testing is recommended for: Everyone born from [birth year] through 1963. Anyone with known risk factors for hepatitis C. Sexually transmitted infections (STIs) You should be screened each year for STIs, including gonorrhea and chlamydia, if: You are sexually active and are younger than 69 years of age. You are older than 69 years of age and your health care provider tells you that you are at risk for this type of infection. Your sexual activity has changed since you were last screened, and you are  at increased risk for chlamydia or gonorrhea. Ask your health care provider if you are at risk. Ask your health care provider about whether you are at high risk for HIV. Your health care provider may recommend a prescription medicine to help prevent HIV infection. If you choose to take medicine to prevent HIV, you should first get tested for HIV. You should then be tested every 3 months for as long as you are taking the medicine. Follow these instructions at home: Alcohol use Anthony Pierce not drink alcohol if your health care provider tells you not to drink. If you drink alcohol: Limit how much you have to 0-2 drinks a day. Know how much alcohol is in your drink. In the [place] one drink equals one  12 oz bottle of beer (355 mL), one 5 oz glass of wine (148 mL), or one 1 oz glass of hard liquor (44 mL). Lifestyle Anthony Pierce not use any products that contain nicotine or tobacco. These products include cigarettes, chewing tobacco, and vaping devices, such as e-cigarettes. If you need help quitting, ask your health care provider. Anthony Pierce not use street drugs. Anthony Pierce not share needles. Ask your health care provider for help if you need support or information about quitting drugs. General instructions Schedule regular health, dental, and eye exams. Stay current with your vaccines. Tell your health care provider if: You often feel depressed. You have ever been abused or Anthony Pierce not feel safe at home. Summary Adopting a healthy lifestyle and getting preventive care are important in promoting health and wellness. Follow your health care provider's instructions about healthy diet, exercising, and getting tested or screened for diseases. Follow your health care provider's instructions on monitoring your cholesterol and blood pressure. This information is not intended to replace advice given to you by your health care provider. Make sure you discuss any questions you have with your health care provider. Document Revised: 10/09/2020 Document Reviewed: 10/09/2020 Elsevier Patient Education  [place].

## 2021-07-09 NOTE — Progress Notes (Signed)
MEDICARE ANNUAL WELLNESS VISIT  07/09/2021  Telephone Visit Disclaimer This Medicare AWV was conducted by telephone due to national recommendations for restrictions regarding the COVID-19 Pandemic (e.g. social distancing).  I verified, using two identifiers, that I am speaking with Anthony Pierce or their authorized healthcare agent. I discussed the limitations, risks, security, and privacy concerns of performing an evaluation and management service by telephone and the potential availability of an in-person appointment in the future. The patient expressed understanding and agreed to proceed.  Location of Patient: Home Location of Provider (nurse):  In the office.  Subjective:    Anthony Pierce is a 71 y.o. male patient of Luetta Nutting, DO who had a Medicare Annual Wellness Visit today via telephone. Makena is Retired and lives alone. he has 2 children. he reports that he is socially active and does interact with friends/family regularly. he is minimally physically active and enjoys playing guitar.  Patient Care Team: Luetta Nutting, DO as PCP - General (Family Medicine) Darius Bump, [facility] as Pharmacist (Pharmacist)  Advanced Directives 07/09/2021 06/14/2020 12/30/2016 07/11/2016 07/11/2016 07/08/2016 04/23/2016  Does Patient Have a Medical Advance Directive? Yes No No - No No No  Type of Advance Directive Living will;Healthcare Power of Attorney - - - - - -  Does patient want to make changes to medical advance directive? No - Patient declined - - - - - -  Copy of [place] in Chart? No - copy requested - - - - - -  Would patient like information on creating a medical advance directive? - No - Patient declined - No - Patient declined - No - Patient declined -    Hospital Utilization Over the Past 12 Months: # of hospitalizations or ER visits: 0 # of surgeries: 0  Review of Systems    Patient reports that his overall health is unchanged compared to last year.  History  obtained from chart review and the patient  Patient Reported Readings (BP, Pulse, CBG, Weight, etc) none  Pain Assessment Pain : No/denies pain     Current Medications & Allergies (verified) Allergies as of 07/09/2021       Reactions   Penicillins Rash, Other (See Comments)   Has patient had a PCN reaction causing immediate rash, facial/tongue/throat swelling, SOB or lightheadedness with hypotension: Yes Has patient had a PCN reaction causing severe rash involving mucus membranes or skin necrosis: No Has patient had a PCN reaction that required hospitalization No Has patient had a PCN reaction occurring within the last 10 years: No If all of the above answers are "NO", then may proceed with Cephalosporin use.        Medication List        Accurate as of July 09, 2021  3:08 PM. If you have any questions, ask your nurse or doctor.          ALPRAZolam 0.5 MG tablet Commonly known as: XANAX TAKE 1 TABLET BY MOUTH TWICE DAILY AS NEEDED FOR ANXIETY, USE SPARINGLY TO PREVENT TOLERANCE/ DEPENDENCE   atorvastatin 40 MG tablet Commonly known as: LIPITOR Take 1 tablet (40 mg total) by mouth daily.   carvedilol 25 MG tablet Commonly known as: COREG TAKE 1 TABLET(25 MG) BY MOUTH TWICE DAILY WITH A MEAL   ciclopirox 8 % solution Commonly known as: PENLAC Apply topically at bedtime. Apply over nail and surrounding skin. Apply daily over previous coat. After seven (7) days, may remove with alcohol and continue cycle.  clopidogrel 75 MG tablet Commonly known as: PLAVIX Take 1 tablet (75 mg total) by mouth daily.   hydrALAZINE 100 MG tablet Commonly known as: APRESOLINE Take 1 tablet (100 mg total) by mouth 2 (two) times daily.   ibuprofen 200 MG tablet Commonly known as: ADVIL Take 400 mg by mouth every 6 (six) hours as needed for mild pain.   isosorbide mononitrate 30 MG 24 hr tablet Commonly known as: IMDUR Take 1 tablet (30 mg total) by mouth daily.   losartan  100 MG tablet Commonly known as: COZAAR Take 1 tablet (100 mg total) by mouth daily.   nicotine 14 mg/24hr patch Commonly known as: NICODERM CQ - dosed in mg/24 hours Place 14 mg onto the skin daily.   traMADol 50 MG tablet Commonly known as: ULTRAM SMARTSIG:1 By Mouth 4-5 Times Daily        History (reviewed): Past Medical History:  Diagnosis Date   CHF (congestive heart failure) (HCC)    Diverticulosis    Dysrhythmia    heart palpitations infrequent   Hemorrhoids    History of AAA (abdominal aortic aneurysm) repair    History of kidney stones    Hyperlipidemia    Hypertension    Kidney stones    Past Surgical History:  Procedure Laterality Date   ABDOMINAL AORTIC ENDOVASCULAR STENT GRAFT Bilateral 03/27/2016   Procedure: ABDOMINAL AORTIC ENDOVASCULAR STENT GRAFT;  Surgeon: Serafina Mitchell, MD;  Location: [facility];  Service: Vascular;  Laterality: Bilateral;   CARDIAC DEFIBRILLATOR PLACEMENT     COLONSCOPY  AGE 63   LITHOTRIPSY     NEPHROLITHOTOMY Left 07/11/2016   Procedure: NEPHROLITHOTOMY PERCUTANEOUS WITH SURGEON ACESS;  Surgeon: Ardis Hughs, MD;  Location: [facility];  Service: Urology;  Laterality: Left;   Family History  Problem Relation Age of Onset   Cancer Father        stomach   Social History   Socioeconomic History   Marital status: Divorced    Spouse name: Not on file   Number of children: 2   Years of education: 14   Highest education level: Associate degree: academic program  Occupational History   Occupation: Retired    Comment: Programme researcher, 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 Likes to play the guitar for  fun. Tries to walk as much as possible.   Social Determinants of Health   Financial Resource Strain: Low Risk    Difficulty of Paying Living Expenses: Not hard at all  Food Insecurity: No Food Insecurity   Worried About Charity fundraiser in the Last Year: Never true   [place] in the Last Year: Never true  Transportation Needs: No Transportation Needs   Lack of Transportation (Medical): No   Lack of Transportation (Non-Medical): No  Physical Activity: Inactive   Days of Exercise per Week: 0 days   Minutes of Exercise per Session: 0 min  Stress: No Stress Concern Present   Feeling of Stress : Not at all  Social Connections: Moderately Isolated   Frequency of  Communication with Friends and Family: More than three times a week   Frequency of Social Gatherings with Friends and Family: Once a week   Attends Religious Services: More than 4 times per year   Active Member of [employer] or Organizations: No   Attends Archivist Meetings: Never   Marital Status: Divorced    Activities of Daily Living In your present state of health, do you have any difficulty performing the following activities: 07/09/2021  Hearing? N  Vision? N  Difficulty concentrating or making decisions? N  Walking or climbing stairs? N  Dressing or bathing? N  Doing errands, shopping? N  Preparing Food and eating ? N  Using the Toilet? N  In the past six months, have you accidently leaked urine? N  Do you have problems with loss of bowel control? N  Managing your Medications? N  Managing your Finances? N  Housekeeping or managing your Housekeeping? N  Some recent data might be hidden    Patient Education/ Literacy How often do you need to have someone help you when you read instructions, pamphlets, or other written materials from your doctor or pharmacy?: 1 - Never What is the last grade level you completed in school?: Associates degree  Exercise Current Exercise Habits: Home exercise routine,  Type of exercise: walking, Time (Minutes): 30, Frequency (Times/Week): 3, Weekly Exercise (Minutes/Week): 90, Intensity: Mild, Exercise limited by: None identified  Diet Patient reports consuming 2 meals a day and 0 snack(s) a day Patient reports that his primary diet is: Regular Patient reports that she does have regular access to food.   Depression Screen PHQ 2/9 Scores 07/09/2021 10/20/2020 06/14/2020 02/09/2019 08/07/2017 05/08/2017  PHQ - 2 Score 0 0 0 0 1 2  PHQ- 9 Score - - 0 1 7 5      Fall Risk Fall Risk  07/09/2021 03/15/2021 10/20/2020 06/14/2020  Falls in the past year? 0 0 0 0  Number falls in past yr: 0 0 0 0  Injury with Fall? 0 0 0 0  Risk for fall due to : No Fall Risks No Fall Risks No Fall Risks No Fall Risks  Follow up Falls evaluation completed Falls evaluation completed Falls evaluation completed Falls evaluation completed     Objective:  KYRILLOS ADAMS seemed alert and oriented and he participated appropriately during our telephone visit.  Blood Pressure Weight BMI  BP Readings from Last 3 Encounters:  03/15/21 138/89  11/16/20 (!) 171/109  10/20/20 113/77   Wt Readings from Last 3 Encounters:  03/15/21 251 lb (113.9 kg)  11/16/20 241 lb 11.2 oz (109.6 kg)  10/20/20 244 lb 1.3 oz (110.7 kg)   BMI Readings from Last 1 Encounters:  03/15/21 30.55 kg/m    *Unable to obtain current vital signs, weight, and BMI due to telephone visit type  Hearing/Vision  Nashid did not seem to have difficulty with hearing/understanding during the telephone conversation Reports that he has not had a formal eye exam by an eye care professional within the past year Reports that he has not had a formal hearing evaluation within the past year *Unable to fully assess hearing and vision during telephone visit type  Cognitive Function: 6CIT Screen 07/09/2021 06/14/2020  What Year? 0 points 0 points  What month? 0 points 0 points  What time? 0 points 0 points  Count back from 20 0 points 0  points  Months in reverse 0 points 0 points  Repeat phrase 0 points 0 points  Total Score 0 0   (Normal:0-7, Significant for Dysfunction: >8)  Normal Cognitive Function Screening: Yes   Immunization & Health Maintenance Record Immunization History  Administered Date(s) Administered   Tdap 11/19/2017    Health Maintenance  Topic Date Due   Pneumonia Vaccine 44+ Years old (1 - PCV) 07/09/2022 (Originally 12/12/1956)   COLONOSCOPY (Pts 45-42yrs Insurance coverage will need to be confirmed)  07/09/2022 (Originally 12/11/1997)   Hepatitis C Screening  07/09/2022 (Originally 12/12/1970)   TETANUS/TDAP  11/20/2027   HPV VACCINES  Aged Out   INFLUENZA VACCINE  Discontinued   COVID-19 Vaccine  Discontinued   Zoster Vaccines- Shingrix  Discontinued       Assessment  This is a routine wellness examination for [employer].  Health Maintenance: Due or Overdue There are no preventive care reminders to display for this patient.   Anthony Pierce does not need a referral for Community Assistance: Care Management:   no Social Work:    no Prescription Assistance:  no Nutrition/Diabetes Education:  no   Plan:  Personalized Goals  Goals Addressed               This Visit's Progress     Patient Stated (pt-stated)        07/09/2021 AWV Goal: Exercise for General Health  Patient will verbalize understanding of the benefits of increased physical activity: Exercising regularly is important. It will improve your overall fitness, flexibility, and endurance. Regular exercise also will improve your overall health. It can help you control your weight, reduce stress, and improve your bone density. Over the next year, patient will increase physical activity as tolerated with a goal of at least 150 minutes of moderate physical activity per week.  You can tell that you are exercising at a moderate intensity if your heart starts beating faster and you start breathing faster but can still hold a  conversation. Moderate-intensity exercise ideas include: Walking 1 mile (1.6 km) in about 15 minutes Biking Hiking Golfing Dancing Water aerobics Patient will verbalize understanding of everyday activities that increase physical activity by providing examples like the following: Yard work, such as: Sales promotion account executive Gardening Washing windows or floors Patient will be able to explain general safety guidelines for exercising:  Before you start a new exercise program, talk with your health care provider. Do not exercise so much that you hurt yourself, feel dizzy, or get very short of breath. Wear comfortable clothes and wear shoes with good support. Drink plenty of water while you exercise to prevent dehydration or heat stroke. Work out until your breathing and your heartbeat get faster.        Personalized Health Maintenance & Screening Recommendations  Pneumococcal vaccine  Influenza vaccine Colorectal cancer screening Shingrix vaccine   Patient declined all of the vaccines at this time. Will discuss colonoscopy with provider at next visit.  Lung Cancer Screening Recommended: yes; wants to discuss with PCP. (Low Dose CT Chest recommended if Age 32-80 years, 30 pack-year currently smoking OR have quit w/in past 15 years) Hepatitis C Screening recommended: no HIV Screening recommended: no  Advanced Directives: Written information was not prepared per patient's request.  Referrals & Orders No orders of the defined types were placed in this encounter.   Follow-up Plan Follow-up with Luetta Nutting, DO as planned Discuss colorectal cancer screening with Dr. Zigmund Daniel at next in office visit.  Discuss low dose ct  scan for lung cancer screening with Dr. Zigmund Daniel at next in office visit. Medicare wellness visit in chart. AVS printed and mailed to the patient.   I have personally reviewed and  noted the following in the patients chart:   Medical and social history Use of alcohol, tobacco or illicit drugs  Current medications and supplements Functional ability and status Nutritional status Physical activity Advanced directives List of other physicians Hospitalizations, surgeries, and ER visits in previous 12 months Vitals Screenings to include cognitive, depression, and falls Referrals and appointments  In addition, I have reviewed and discussed with Anthony Pierce certain preventive protocols, quality metrics, and best practice recommendations. A written personalized care plan for preventive services as well as general preventive health recommendations is available and can be mailed to the patient at his request.      Tinnie Gens, RN  07/09/2021

## 2021-07-25 ENCOUNTER — Encounter: Payer: Self-pay | Admitting: Family Medicine

## 2021-07-25 ENCOUNTER — Other Ambulatory Visit: Payer: Self-pay

## 2021-07-25 ENCOUNTER — Ambulatory Visit (INDEPENDENT_AMBULATORY_CARE_PROVIDER_SITE_OTHER): Payer: Medicare Other | Admitting: Family Medicine

## 2021-07-25 DIAGNOSIS — F418 Other specified anxiety disorders: Secondary | ICD-10-CM | POA: Diagnosis not present

## 2021-07-25 DIAGNOSIS — I10 Essential (primary) hypertension: Secondary | ICD-10-CM

## 2021-07-25 DIAGNOSIS — I428 Other cardiomyopathies: Secondary | ICD-10-CM | POA: Diagnosis not present

## 2021-07-25 MED ORDER — HYDRALAZINE HCL 100 MG PO TABS: 100.0000 mg | ORAL_TABLET | Freq: Two times a day (BID) | ORAL | 1 refills | Status: DC

## 2021-07-25 MED ORDER — ALPRAZOLAM 0.5 MG PO TABS: ORAL_TABLET | ORAL | 2 refills | Status: DC

## 2021-07-25 MED ORDER — CLOPIDOGREL BISULFATE 75 MG PO TABS: 75.0000 mg | ORAL_TABLET | Freq: Every day | ORAL | 3 refills | Status: DC

## 2021-07-25 MED ORDER — LOSARTAN POTASSIUM 100 MG PO TABS
100.0000 mg | ORAL_TABLET | Freq: Every day | ORAL | 3 refills | Status: DC
Start: 2021-07-25 — End: 2022-08-27

## 2021-07-25 MED ORDER — ISOSORBIDE MONONITRATE ER 30 MG PO TB24: 30.0000 mg | ORAL_TABLET | Freq: Every day | ORAL | 3 refills | Status: DC

## 2021-07-25 MED ORDER — ATORVASTATIN CALCIUM 40 MG PO TABS: 40.0000 mg | ORAL_TABLET | Freq: Every day | ORAL | 3 refills | Status: DC

## 2021-07-25 MED ORDER — CARVEDILOL 25 MG PO TABS: ORAL_TABLET | ORAL | 3 refills | Status: DC

## 2021-07-25 NOTE — Assessment & Plan Note (Signed)
Blood pressure is mildly elevated.  Reports readings at home have been better controlled.  Recommend he continue monitoring and drop off a copy of his home readings.  Low-sodium diet recommended.

## 2021-07-25 NOTE — Assessment & Plan Note (Signed)
Managed by cardiology.  Stable with current medications.

## 2021-07-25 NOTE — Progress Notes (Signed)
Anthony Pierce - 71 y.o. male MRN [id number]  Date of birth: 06-06-1950  Subjective Chief Complaint  Patient presents with   Follow-up    HPI Anthony Pierce is a 71 year old male here today for follow-up visit.  Reports he is doing well at this time.    Since his last visit he had basal cell carcinoma removed from his nose.  This is healed well.  Continues to see cardiology for history of cardiomyopathy and CHF.Marland Kitchen  His previous cardiologist retired and he is now seeing Dr. Elonda Pierce.  Continues to do well with current medications and denies anginal symptoms.  Blood pressure is elevated today.  Reports readings at home have been well controlled.  Tolerating atorvastatin well for management of hyperlipidemia.  Using alprazolam as needed for insomnia.  He tries to limit use of this.  ROS:  A comprehensive ROS was completed and negative except as noted per HPI     Allergies  Allergen Reactions   Penicillins Rash and Other (See Comments)    Has patient had a PCN reaction causing immediate rash, facial/tongue/throat swelling, SOB or lightheadedness with hypotension: Yes Has patient had a PCN reaction causing severe rash involving mucus membranes or skin necrosis: No Has patient had a PCN reaction that required hospitalization No Has patient had a PCN reaction occurring within the last 10 years: No If all of the above answers are "NO", then may proceed with Cephalosporin use.       Past Medical History:  Diagnosis Date   Basal cell carcinoma (BCC) of nasal sidewall 05/2021   Left side of his nose   Basal cell carcinoma, hand, right 06/2021   CHF (congestive heart failure) (HCC)    Diverticulosis    Dysrhythmia    heart palpitations infrequent   Hemorrhoids    History of AAA (abdominal aortic aneurysm) repair    History of kidney stones    Hyperlipidemia    Hypertension    Kidney stones     Past Surgical History:  Procedure Laterality Date   ABDOMINAL AORTIC ENDOVASCULAR STENT GRAFT  Bilateral 03/27/2016   Procedure: ABDOMINAL AORTIC ENDOVASCULAR STENT GRAFT;  Surgeon: Serafina Mitchell, MD;  Location: [facility] OR;  Service: Vascular;  Laterality: Bilateral;   CARDIAC DEFIBRILLATOR PLACEMENT     COLONSCOPY  AGE 59   LITHOTRIPSY     NEPHROLITHOTOMY Left 07/11/2016   Procedure: NEPHROLITHOTOMY PERCUTANEOUS WITH SURGEON ACESS;  Surgeon: Ardis Hughs, MD;  Location: [facility];  Service: Urology;  Laterality: Left;    Social History   Socioeconomic History   Marital status: Divorced    Spouse name: Not on file   Number of children: 2   Years of education: 14   Highest education level: Associate degree: academic program  Occupational History   Occupation: Retired    Comment: Programme researcher, 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 Likes to play the guitar for fun. Tries to walk as much as possible.   Social Determinants of Health   Financial Resource Strain: Low Risk    Difficulty of Paying  Living Expenses: Not hard at all  Food Insecurity: No Food Insecurity   Worried About [place] in the Last Year: Never true   Ran Out of Food in the Last Year: Never true  Transportation Needs: No Transportation Needs   Lack of Transportation (Medical): No   Lack of Transportation (Non-Medical): No  Physical Activity: Inactive   Days of Exercise per Week: 0 days   Minutes of Exercise per Session: 0 min  Stress: No Stress Concern Present   Feeling of Stress : Not at all  Social Connections: Moderately Isolated   Frequency of Communication with Friends and Family: More than three times a week   Frequency of Social Gatherings with Friends and  Family: Once a week   Attends Religious Services: More than 4 times per year   Active Member of [employer] or Organizations: No   Attends Archivist Meetings: Never   Marital Status: Divorced    Family History  Problem Relation Age of Onset   Cancer Father        stomach    Health Maintenance  Topic Date Due   Pneumonia Vaccine 74+ Years old (1 - PCV) 07/09/2022 (Originally 12/12/1956)   COLONOSCOPY (Pts 45-31yrs Insurance coverage will need to be confirmed)  07/09/2022 (Originally 12/11/1997)   Hepatitis C Screening  07/09/2022 (Originally [birth year]/1970)   TETANUS/TDAP  11/20/2027   HPV VACCINES  Aged Out   INFLUENZA VACCINE  Discontinued   COVID-19 Vaccine  Discontinued   Zoster Vaccines- Shingrix  Discontinued     ----------------------------------------------------------------------------------------------------------------------------------------------------------------------------------------------------------------- Physical Exam BP (!) 143/83    Pulse 73    Temp 98 F (36.7 C)    Ht 6\' 4"  (1.93 m)    Wt 249 lb (112.9 kg)    SpO2 96%    BMI 30.31 kg/m   Physical Exam Constitutional:      Appearance: Normal appearance.  Eyes:     General: No scleral icterus. Cardiovascular:     Rate and Rhythm: Normal rate and regular rhythm.  Pulmonary:     Effort: Pulmonary effort is normal.     Breath sounds: Normal breath sounds.  Musculoskeletal:     Cervical back: Neck supple.  Neurological:     Mental Status: He is alert.  Psychiatric:        Mood and Affect: Mood normal.        Behavior: Behavior normal.    ------------------------------------------------------------------------------------------------------------------------------------------------------------------------------------------------------------------- Assessment and Plan  Essential hypertension Blood pressure is mildly elevated.  Reports readings at home have been better controlled.  Recommend he  continue monitoring and drop off a copy of his home readings.  Low-sodium diet recommended.  Nonischemic cardiomyopathy ([place]) Managed by cardiology.  Stable with current medications.  Situational anxiety Using alprazolam as needed at night to help with anxiety and insomnia.  She is   Meds ordered this encounter  Medications   ALPRAZolam (XANAX) 0.5 MG tablet    Sig: TAKE 1 TABLET BY MOUTH TWICE DAILY AS NEEDED FOR ANXIETY, USE SPARINGLY TO PREVENT TOLERANCE/ DEPENDENCE    Dispense:  30 tablet    Refill:  2   atorvastatin (LIPITOR) 40 MG tablet    Sig: Take 1 tablet (40 mg total) by mouth daily.    Dispense:  90 tablet    Refill:  3   carvedilol (COREG) 25 MG tablet    Sig: TAKE 1 TABLET(25 MG) BY MOUTH TWICE DAILY WITH A MEAL    Dispense:  180 tablet  Refill:  3   clopidogrel (PLAVIX) 75 MG tablet    Sig: Take 1 tablet (75 mg total) by mouth daily.    Dispense:  90 tablet    Refill:  3   hydrALAZINE (APRESOLINE) 100 MG tablet    Sig: Take 1 tablet (100 mg total) by mouth 2 (two) times daily.    Dispense:  180 tablet    Refill:  1   isosorbide mononitrate (IMDUR) 30 MG 24 hr tablet    Sig: Take 1 tablet (30 mg total) by mouth daily.    Dispense:  90 tablet    Refill:  3   losartan (COZAAR) 100 MG tablet    Sig: Take 1 tablet (100 mg total) by mouth daily.    Dispense:  90 tablet    Refill:  3    Return in about 6 months (around 01/22/2022) for HTN/Labs.    This visit occurred during the SARS-CoV-2 public health emergency.  Safety protocols were in place, including screening questions prior to the visit, additional usage of staff PPE, and extensive cleaning of exam room while observing appropriate contact time as indicated for disinfecting solutions.

## 2021-07-25 NOTE — Assessment & Plan Note (Addendum)
Using alprazolam as needed at night to help with anxiety and insomnia.

## 2021-08-01 ENCOUNTER — Other Ambulatory Visit: Payer: Self-pay | Admitting: Family Medicine

## 2021-09-03 ENCOUNTER — Telehealth: Payer: Self-pay

## 2021-09-03 NOTE — Telephone Encounter (Signed)
Pt lvm stating he was feeling lightheaded and wanted to know if his meds may be 'out of whack'. ? ?Front Desk: ?Please contact pt and direct the call to Triage for further information. Thanks ?

## 2021-09-12 NOTE — Telephone Encounter (Signed)
Called and spoke with patient. He states lightheadedness comes and goes. No other symptoms. Never had fainting. He has checked blood pressure when having this feeling and states blood pressure readings are normal for him - around 134/84. He is trying to drink plenty of fluids. Advised patient there are different reasons for lightheadedness, advised to make a visit to discuss with the provider. He also has an upcoming appointment with Cardiology in five days and will let them know.  ?

## 2021-09-17 DIAGNOSIS — N289 Disorder of kidney and ureter, unspecified: Secondary | ICD-10-CM | POA: Diagnosis not present

## 2021-09-17 DIAGNOSIS — Z95810 Presence of automatic (implantable) cardiac defibrillator: Secondary | ICD-10-CM | POA: Diagnosis not present

## 2021-09-17 DIAGNOSIS — I428 Other cardiomyopathies: Secondary | ICD-10-CM | POA: Diagnosis not present

## 2021-09-17 DIAGNOSIS — I447 Left bundle-branch block, unspecified: Secondary | ICD-10-CM | POA: Diagnosis not present

## 2021-09-17 DIAGNOSIS — I429 Cardiomyopathy, unspecified: Secondary | ICD-10-CM | POA: Diagnosis not present

## 2021-09-17 DIAGNOSIS — I10 Essential (primary) hypertension: Secondary | ICD-10-CM | POA: Diagnosis not present

## 2021-10-16 DIAGNOSIS — L821 Other seborrheic keratosis: Secondary | ICD-10-CM | POA: Diagnosis not present

## 2021-10-16 DIAGNOSIS — L570 Actinic keratosis: Secondary | ICD-10-CM | POA: Diagnosis not present

## 2021-10-16 DIAGNOSIS — C44622 Squamous cell carcinoma of skin of right upper limb, including shoulder: Secondary | ICD-10-CM | POA: Diagnosis not present

## 2021-10-25 ENCOUNTER — Other Ambulatory Visit: Payer: Self-pay | Admitting: Family Medicine

## 2021-10-26 ENCOUNTER — Other Ambulatory Visit: Payer: Self-pay

## 2021-12-26 ENCOUNTER — Other Ambulatory Visit: Payer: Self-pay | Admitting: *Deleted

## 2021-12-26 DIAGNOSIS — Z9889 Other specified postprocedural states: Secondary | ICD-10-CM

## 2022-01-21 ENCOUNTER — Ambulatory Visit: Payer: Medicare Other

## 2022-01-21 ENCOUNTER — Other Ambulatory Visit (HOSPITAL_COMMUNITY): Payer: Medicare Other

## 2022-01-22 ENCOUNTER — Ambulatory Visit: Payer: Medicare Other | Admitting: Family Medicine

## 2022-01-22 DIAGNOSIS — I10 Essential (primary) hypertension: Secondary | ICD-10-CM

## 2022-01-25 ENCOUNTER — Other Ambulatory Visit: Payer: Self-pay | Admitting: Family Medicine

## 2022-02-18 ENCOUNTER — Ambulatory Visit (HOSPITAL_COMMUNITY)
Admission: RE | Admit: 2022-02-18 | Discharge: 2022-02-18 | Disposition: A | Discharge status: Home / Self Care | Payer: Medicare Other | Source: Ambulatory Visit | Attending: Surgery | Admitting: Surgery

## 2022-02-18 ENCOUNTER — Encounter: Payer: Self-pay | Admitting: Physician Assistant

## 2022-02-18 ENCOUNTER — Ambulatory Visit (INDEPENDENT_AMBULATORY_CARE_PROVIDER_SITE_OTHER): Payer: Medicare Other | Admitting: Physician Assistant

## 2022-02-18 ENCOUNTER — Other Ambulatory Visit: Payer: Self-pay

## 2022-02-18 VITALS — BP 174/106 | HR 94 | Temp 98.1°F | Resp 20 | Ht 76.0 in | Wt 253.3 lb

## 2022-02-18 DIAGNOSIS — Z8679 Personal history of other diseases of the circulatory system: Secondary | ICD-10-CM

## 2022-02-18 DIAGNOSIS — Z9889 Other specified postprocedural states: Secondary | ICD-10-CM | POA: Insufficient documentation

## 2022-02-18 NOTE — Progress Notes (Signed)
Office Note     CC:  follow up Requesting Provider:  Luetta Nutting, DO  HPI: Anthony Pierce is a 71 y.o. (1951/03/12) male who presents for surveillance of endovascular repair of abdominal aortic aneurysm.  This was performed by Dr. Trula Slade in 2017.  Patient denies any new or changing abdominal or back pain.  He is also ambulating well without any claudication.  He is on a statin daily he is also on daily Plavix.  Patient states he has no history of CKD.  He is a former smoker.   Past Medical History:  Diagnosis Date   Basal cell carcinoma (BCC) of nasal sidewall 05/2021   Left side of his nose   Basal cell carcinoma, hand, right 06/2021   CHF (congestive heart failure) (HCC)    Diverticulosis    Dysrhythmia    heart palpitations infrequent   Hemorrhoids    History of AAA (abdominal aortic aneurysm) repair    History of kidney stones    Hyperlipidemia    Hypertension    Kidney stones     Past Surgical History:  Procedure Laterality Date   ABDOMINAL AORTIC ENDOVASCULAR STENT GRAFT Bilateral 03/27/2016   Procedure: ABDOMINAL AORTIC ENDOVASCULAR STENT GRAFT;  Surgeon: Serafina Mitchell, MD;  Location: [facility];  Service: Vascular;  Laterality: Bilateral;   CARDIAC DEFIBRILLATOR PLACEMENT     COLONSCOPY  AGE 73   LITHOTRIPSY     NEPHROLITHOTOMY Left 07/11/2016   Procedure: NEPHROLITHOTOMY PERCUTANEOUS WITH SURGEON ACESS;  Surgeon: Ardis Hughs, MD;  Location: [facility];  Service: Urology;  Laterality: Left;    Social History   Socioeconomic History   Marital status: Divorced    Spouse name: Not on file   Number of children: 2   Years of education: 14   Highest education level: Associate degree: academic program  Occupational History   Occupation: Retired    Comment: Programme researcher, 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 Likes to play the guitar for fun. Tries to walk as much as possible.   Social Determinants of Health   Financial Resource Strain: Low Risk  (07/09/2021)   Overall Financial Resource Strain (CARDIA)    Difficulty of Paying Living Expenses: Not hard at all  Food Insecurity: No Food Insecurity (07/09/2021)   Hunger Vital Sign    Worried About Running Out of Food in the Last Year: Never true    Ran Out of Food in the Last Year: Never true  Transportation Needs: No Transportation Needs (07/09/2021)   PRAPARE - Hydrologist (Medical): No    Lack of Transportation (Non-Medical): No  Physical Activity: Inactive (07/09/2021)   Exercise Vital Sign    Days of Exercise per Week: 0 days    Minutes of Exercise per Session: 0 min  Stress: No Stress Concern Present (07/09/2021)   [place]    Feeling of Stress : Not at all  Social Connections: Moderately Isolated (07/09/2021)   Social Connection and Isolation Panel [NHANES]  Frequency of Communication with Friends and Family: More than three times a week    Frequency of Social Gatherings with Friends and Family: Once a week    Attends Religious Services: More than 4 times per year    Active Member of [employer] or Organizations: No    Attends Archivist Meetings: Never    Marital Status: Divorced  Human resources officer Violence: Not At Risk (07/09/2021)   Humiliation, Afraid, Rape, and Kick questionnaire    Fear of Current or Ex-Partner: No    Emotionally Abused: No    Physically Abused: No    Sexually Abused: No    Family History  Problem Relation Age of Onset   Cancer Father        stomach    Current Outpatient Medications  Medication Sig Dispense Refill   ALPRAZolam  (XANAX) 0.5 MG tablet TAKE 1 TABLET BY MOUTH TWICE DAILY AS NEEDED FOR ANXIETY; USE SPARINGLY TO PREVENT TOLERANCE/DEPENDENCE 30 tablet 3   atorvastatin (LIPITOR) 40 MG tablet Take 1 tablet (40 mg total) by mouth daily. 90 tablet 3   carvedilol (COREG) 25 MG tablet TAKE 1 TABLET(25 MG) BY MOUTH TWICE DAILY WITH A MEAL 180 tablet 3   ciclopirox (PENLAC) 8 % solution Apply topically at bedtime. Apply over nail and surrounding skin. Apply daily over previous coat. After seven (7) days, may remove with alcohol and continue cycle. 18 mL 11   clopidogrel (PLAVIX) 75 MG tablet Take 1 tablet (75 mg total) by mouth daily. 90 tablet 3   hydrALAZINE (APRESOLINE) 100 MG tablet Take 1 tablet (100 mg total) by mouth 2 (two) times daily. 60 tablet 0   ibuprofen (ADVIL,MOTRIN) 200 MG tablet Take 400 mg by mouth every 6 (six) hours as needed for mild pain.     isosorbide mononitrate (IMDUR) 30 MG 24 hr tablet Take 1 tablet (30 mg total) by mouth daily. 90 tablet 3   losartan (COZAAR) 100 MG tablet Take 1 tablet (100 mg total) by mouth daily. 90 tablet 3   nicotine (NICODERM CQ - DOSED IN MG/24 HOURS) 14 mg/24hr patch Place 14 mg onto the skin daily.     traMADol (ULTRAM) 50 MG tablet      No current facility-administered medications for this visit.    Allergies  Allergen Reactions   Penicillins Rash and Other (See Comments)    Has patient had a PCN reaction causing immediate rash, facial/tongue/throat swelling, SOB or lightheadedness with hypotension: Yes Has patient had a PCN reaction causing severe rash involving mucus membranes or skin necrosis: No Has patient had a PCN reaction that required hospitalization No Has patient had a PCN reaction occurring within the last 10 years: No If all of the above answers are "NO", then may proceed with Cephalosporin use.        REVIEW OF SYSTEMS:   '[X]'$  denotes positive finding, '[ ]'$  denotes negative finding Cardiac  Comments:  Chest pain or chest pressure:     Shortness of breath upon exertion:    Short of breath when lying flat:    Irregular heart rhythm:        Vascular    Pain in calf, thigh, or hip brought on by ambulation:    Pain in feet at night that wakes you up from your sleep:     Blood clot in your veins:    Leg swelling:         Pulmonary    Oxygen at home:  Productive cough:     Wheezing:         Neurologic    Sudden weakness in arms or legs:     Sudden numbness in arms or legs:     Sudden onset of difficulty speaking or slurred speech:    Temporary loss of vision in one eye:     Problems with dizziness:         Gastrointestinal    Blood in stool:     Vomited blood:         Genitourinary    Burning when urinating:     Blood in urine:        Psychiatric    Major depression:         Hematologic    Bleeding problems:    Problems with blood clotting too easily:        Skin    Rashes or ulcers:        Constitutional    Fever or chills:      PHYSICAL EXAMINATION:  Vitals:   02/18/22 0851  BP: (!) 174/106  Pulse: 94  Resp: 20  Temp: 98.1 F (36.7 C)  TempSrc: Temporal  SpO2: 95%  Weight: 253 lb 4.8 oz (114.9 kg)  Height: '6\' 4"'$  (1.93 m)    General:  WDWN in NAD; vital signs documented above Gait: Not observed HENT: WNL, normocephalic Pulmonary: normal non-labored breathing , without Rales, rhonchi,  wheezing Cardiac: regular HR Abdomen: soft, NT, no masses Skin: without rashes Vascular Exam/Pulses:  Right Left  Radial 2+ (normal) 2+ (normal)  DP 2+ (normal) 2+ (normal)   Extremities: Bilateral lower extremities well-perfused without any wounds or significant edema Musculoskeletal: no muscle wasting or atrophy  Neurologic: A&O X 3;  No focal weakness or paresthesias are detected Psychiatric:  The pt has Normal affect.   Non-Invasive Vascular Imaging:   5.75 cm AAA at largest diameter which is increased compared to 5 cm 1 year ago No endoleaks noted    ASSESSMENT/PLAN:: 71 y.o. male  here for surveillance of EVAR  -EVAR duplex demonstrates AAA sac measuring 5.7 cm which has increased compared to 5 cm 2 years ago.  AAA sac measured 4 cm 1 year ago however this may have been under called when images were reviewed with the ultrasonographer.  At any rate, given the increase in size we will rule out endoleak with CTA abdomen and pelvis.  Assuming CTA will be negative, these results can be called to the patient when they become available.  If AAA sac is stable without endoleak's, we will return to annual surveillance.  This plan was discussed with the patient and he is agreeable to proceed.   Dagoberto Ligas, PA-C [facility] [phone]  Clinic MD:   Trula Slade

## 2022-02-21 ENCOUNTER — Other Ambulatory Visit: Payer: Self-pay | Admitting: Family Medicine

## 2022-02-21 NOTE — Telephone Encounter (Signed)
Scheduled 02/26/2022- tvt

## 2022-02-21 NOTE — Telephone Encounter (Signed)
Front Desk: Please contact patient to schedule appt per last note.  "Return in about 6 months (around 01/22/2022) for HTN/Labs."  Thanks

## 2022-02-26 ENCOUNTER — Ambulatory Visit (INDEPENDENT_AMBULATORY_CARE_PROVIDER_SITE_OTHER): Payer: Medicare Other | Admitting: Family Medicine

## 2022-02-26 ENCOUNTER — Encounter: Payer: Self-pay | Admitting: Family Medicine

## 2022-02-26 DIAGNOSIS — I10 Essential (primary) hypertension: Secondary | ICD-10-CM

## 2022-02-26 DIAGNOSIS — M7021 Olecranon bursitis, right elbow: Secondary | ICD-10-CM | POA: Insufficient documentation

## 2022-02-26 DIAGNOSIS — F418 Other specified anxiety disorders: Secondary | ICD-10-CM | POA: Diagnosis not present

## 2022-02-26 DIAGNOSIS — N200 Calculus of kidney: Secondary | ICD-10-CM | POA: Diagnosis not present

## 2022-02-26 DIAGNOSIS — N189 Chronic kidney disease, unspecified: Secondary | ICD-10-CM | POA: Diagnosis not present

## 2022-02-26 DIAGNOSIS — I7142 Juxtarenal abdominal aortic aneurysm, without rupture: Secondary | ICD-10-CM | POA: Diagnosis not present

## 2022-02-26 DIAGNOSIS — I428 Other cardiomyopathies: Secondary | ICD-10-CM | POA: Diagnosis not present

## 2022-02-26 DIAGNOSIS — K8020 Calculus of gallbladder without cholecystitis without obstruction: Secondary | ICD-10-CM | POA: Diagnosis not present

## 2022-02-26 DIAGNOSIS — K55069 Acute infarction of intestine, part and extent unspecified: Secondary | ICD-10-CM | POA: Diagnosis not present

## 2022-02-26 MED ORDER — MELOXICAM 15 MG PO TABS: 15.0000 mg | ORAL_TABLET | Freq: Every day | ORAL | 1 refills | Status: DC | PRN

## 2022-02-26 MED ORDER — ALPRAZOLAM 0.5 MG PO TABS: 0.5000 mg | ORAL_TABLET | Freq: Every day | ORAL | 3 refills | Status: DC

## 2022-02-26 NOTE — Assessment & Plan Note (Signed)
Alprazolam renewed.  Discussed trying to limit this as much as possible.

## 2022-02-26 NOTE — Assessment & Plan Note (Signed)
This appears chronic in nature.  Embolization from ibuprofen to meloxicam to use as needed.  Additionally he may use a compression sleeve over this area to help protect this further damage.

## 2022-02-26 NOTE — Patient Instructions (Addendum)
Take meloxicam 15 mg daily for the next 2 weeks then as needed.   Use sleeve to keep elbow protected and reduce swelling   Elbow Bursitis  Elbow (olecranon) bursitis is the swelling of the fluid-filled sac (bursa) between the tip of your elbow and your skin. A bursa acts as a cushion and protects the joint. If the bursa becomes irritated, it can fill with extra fluid and become inflamed. This condition is also called olecranon bursitis. What are the causes? This condition may be caused by: An elbow injury, such as falling onto the elbow. Leaning on hard surfaces for long periods of time. Infection from an injury that breaks the skin near the elbow. A bone growth (spur) that forms at the tip of the elbow. A medical condition that causes inflammation, such as gout or rheumatoid arthritis. Sometimes the cause is not known. What are the signs or symptoms? The first sign of elbow bursitis is usually swelling at the tip of the elbow. This can grow to be about the size of a golf ball. Swelling may start suddenly or develop gradually. Other symptoms may include: Pain when bending or leaning on the elbow. Stiffness, or not being able to move the elbow normally. If bursitis is caused by an infection, you may have: Redness, warmth, and tenderness of the elbow. Drainage of pus from the swollen area over the elbow, if the skin breaks open. How is this diagnosed? This condition may be diagnosed based on: Your symptoms and medical history. Any recent injuries you have had. A physical exam. X-rays to check for a bone spur or fracture. Draining fluid from the bursa to test it for infection. Blood tests to rule out gout or rheumatoid arthritis. How is this treated? Treatment for this condition depends on the cause. Treatment may include: Medicines. These may include: Over-the-counter medicines to relieve pain and inflammation. Antibiotic medicines if there is an infection. Injections of  anti-inflammatory medicines (steroids). Draining fluid from the bursa. Wrapping your elbow with a bandage or compression arm sleeve. Wearing elbow pads. If these treatments do not help, you may need surgery to remove the bursa. Follow these instructions at home: Medicines Take over-the-counter and prescription medicines only as told by your health care provider. If you were prescribed an antibiotic medicine, take it as told by your health care provider. Do not stop taking the antibiotic even if you start to feel better. Managing pain, stiffness, and swelling     If directed, put ice on the affected area. To do this: Put ice in a plastic bag. Place a towel between your skin and the bag. Leave the ice on for 20 minutes, 2-3 times a day. Remove the ice if your skin turns bright red. This is very important. If you cannot feel pain, heat, or cold, you have a greater risk of damage to the area. If directed, apply heat to the affected area as often as told by your health care provider. Use the heat source that your health care provider recommends, such as a moist heat pack or a heating pad. Place a towel between your skin and the heat source. Leave the heat on 20-30 minutes. Remove the heat if your skin turns bright red. This is especially important if you are unable to feel pain, heat, or cold. You may have a greater risk of getting burned. If your bursitis is caused by an injury, rest your elbow and wear your bandage or sleeve as told by your health care  provider. Use elbow pads or elbow wraps to cushion your elbow and control swelling as needed. General instructions Avoid any activities that cause elbow pain. Ask your health care provider what activities are safe for you. Keep all follow-up visits. This is important. Contact a health care provider if: You have a fever. Your symptoms do not get better with treatment. You have pain or swelling that gets worse, or it goes away and then comes  back. You have pus draining from your elbow. You have redness around the elbow area. Your elbow is warm to the touch. Get help right away if: You have trouble moving your arm, hand, or fingers. Summary Elbow (olecranon) bursitis is the swelling of the fluid-filled sac (bursa) between the tip of your elbow and your skin. Treatment for elbow bursitis depends on the cause. It may include medicines to relieve pain and inflammation, antibiotic medicines to treat an infection, or draining fluid from your elbow. Contact a health care provider if your symptoms do not get better with treatment, or if your symptoms go away and then come back. This information is not intended to replace advice given to you by your health care provider. Make sure you discuss any questions you have with your health care provider. Document Revised: 05/15/2021 Document Reviewed: 05/15/2021 Elsevier Patient Education  Anthony Pierce.

## 2022-02-26 NOTE — Progress Notes (Signed)
Anthony Pierce - 71 y.o. male MRN [id number]  Date of birth: 04/16/1951  Subjective Chief Complaint  Patient presents with   Hypertension    HPI Anthony Pierce is a 71 y.o. male here today for follow-up visit.  Reports that he is doing well at this time.  He has noted some swelling of his right elbow over the past 2 to 3 months.  He denies any pain associated with this.  Does not recall any injury or overuse.  Blood pressures remain well controlled with combination of Coreg, hydralazine, Imdur and losartan.  He does see cardiology regularly due to history of cardiomyopathy.  Denies anginal symptoms, increased dyspnea, dizziness.  He does continue to smoke.  Has been able to cut back in the past but not interested in quitting at this time.  Request refill of alprazolam.  Uses this for severe anxiety and irritability.  This seems to work pretty well with him.  He tries to limit use of this is much as possible.  ROS:  A comprehensive ROS was completed and negative except as noted per HPI  Allergies  Allergen Reactions   Penicillins Rash and Other (See Comments)    Has patient had a PCN reaction causing immediate rash, facial/tongue/throat swelling, SOB or lightheadedness with hypotension: Yes Has patient had a PCN reaction causing severe rash involving mucus membranes or skin necrosis: No Has patient had a PCN reaction that required hospitalization No Has patient had a PCN reaction occurring within the last 10 years: No If all of the above answers are "NO", then may proceed with Cephalosporin use.       Past Medical History:  Diagnosis Date   Basal cell carcinoma (BCC) of nasal sidewall 05/2021   Left side of his nose   Basal cell carcinoma, hand, right 06/2021   CHF (congestive heart failure) (HCC)    Diverticulosis    Dysrhythmia    heart palpitations infrequent   Hemorrhoids    History of AAA (abdominal aortic aneurysm) repair    History of kidney stones    Hyperlipidemia     Hypertension    Kidney stones     Past Surgical History:  Procedure Laterality Date   ABDOMINAL AORTIC ENDOVASCULAR STENT GRAFT Bilateral 03/27/2016   Procedure: ABDOMINAL AORTIC ENDOVASCULAR STENT GRAFT;  Surgeon: Serafina Mitchell, MD;  Location: [facility];  Service: Vascular;  Laterality: Bilateral;   CARDIAC DEFIBRILLATOR PLACEMENT     COLONSCOPY  AGE 33   LITHOTRIPSY     NEPHROLITHOTOMY Left 07/11/2016   Procedure: NEPHROLITHOTOMY PERCUTANEOUS WITH SURGEON ACESS;  Surgeon: Ardis Hughs, MD;  Location: [facility];  Service: Urology;  Laterality: Left;    Social History   Socioeconomic History   Marital status: Divorced    Spouse name: Not on file   Number of children: 2   Years of education: 14   Highest education level: Associate degree: academic program  Occupational History   Occupation: Retired    Comment: Programme researcher, 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 Likes to play  the guitar for fun. Tries to walk as much as possible.   Social Determinants of Health   Financial Resource Strain: Low Risk  (07/09/2021)   Overall Financial Resource Strain (CARDIA)    Difficulty of Paying Living Expenses: Not hard at all  Food Insecurity: No Food Insecurity (07/09/2021)   Hunger Vital Sign    Worried About Running Out of Food in the Last Year: Never true    Ran Out of Food in the Last Year: Never true  Transportation Needs: No Transportation Needs (07/09/2021)   PRAPARE - Hydrologist (Medical): No    Lack of Transportation (Non-Medical): No  Physical Activity: Inactive (07/09/2021)   Exercise Vital Sign    Days of Exercise  per Week: 0 days    Minutes of Exercise per Session: 0 min  Stress: No Stress Concern Present (07/09/2021)   [place]    Feeling of Stress : Not at all  Social Connections: Moderately Isolated (07/09/2021)   Social Connection and Isolation Panel [NHANES]    Frequency of Communication with Friends and Family: More than three times a week    Frequency of Social Gatherings with Friends and Family: Once a week    Attends Religious Services: More than 4 times per year    Active Member of [employer] or Organizations: No    Attends Archivist Meetings: Never    Marital Status: Divorced    Family History  Problem Relation Age of Onset   Cancer Father        stomach    Health Maintenance  Topic Date Due   Pneumonia Vaccine 13+ Years old (1 - PCV) 07/09/2022 (Originally 12/12/1956)   COLONOSCOPY (Pts 45-83yr Insurance coverage will need to be confirmed)  07/09/2022 (Originally 12/11/1997)   Hepatitis C Screening  07/09/2022 (Originally 12/12/1970)   TETANUS/TDAP  11/20/2027   HPV VACCINES  Aged Out   INFLUENZA VACCINE  Discontinued   COVID-19 Vaccine  Discontinued   Zoster Vaccines- Shingrix  Discontinued     ----------------------------------------------------------------------------------------------------------------------------------------------------------------------------------------------------------------- Physical Exam BP 121/81 (BP Location: Left Arm, Patient Position: Sitting, Cuff Size: Large)   Pulse 85   Ht '6\' 4"'$  (1.93 m)   Wt 253 lb (114.8 kg)   SpO2 95%   BMI 30.80 kg/m   Physical Exam Constitutional:      Appearance: Normal appearance.  Eyes:     General: No scleral icterus. Cardiovascular:     Rate and Rhythm: Normal rate and regular rhythm.  Pulmonary:     Effort: Pulmonary effort is normal.     Breath sounds: Normal breath sounds.  Musculoskeletal:     Cervical back: Neck supple.      Comments: Rubbery mass along the posterior olecranon.  This is nontender without erythema.  Neurological:     General: No focal deficit present.     Mental Status: He is alert.  Psychiatric:        Mood and Affect: Mood normal.        Behavior: Behavior normal.     ------------------------------------------------------------------------------------------------------------------------------------------------------------------------------------------------------------------- Assessment and Plan  Essential hypertension Blood pressure is well controlled at this time.  Recommend continuation of current medications for management of hypertension.  Nonischemic cardiomyopathy (HDenton Management per cardiology.  No anginal symptoms or dyspnea at this time.  Stable with current medications.  Situational anxiety Alprazolam renewed.  Discussed trying to limit this as much as possible.  Olecranon bursitis of right elbow This  appears chronic in nature.  Embolization from ibuprofen to meloxicam to use as needed.  Additionally he may use a compression sleeve over this area to help protect this further damage.   Meds ordered this encounter  Medications   ALPRAZolam (XANAX) 0.5 MG tablet    Sig: Take 1 tablet (0.5 mg total) by mouth at bedtime. TAKE 1 TABLET BY MOUTH TWICE DAILY AS NEEDED FOR ANXIETY. USE SPARINGLY TO PREVENT TOLERANCE OR DEPENDENCE    Dispense:  30 tablet    Refill:  3   meloxicam (MOBIC) 15 MG tablet    Sig: Take 1 tablet (15 mg total) by mouth daily as needed for pain.    Dispense:  30 tablet    Refill:  1    Return in about 6 months (around 08/27/2022) for HTN.    This visit occurred during the SARS-CoV-2 public health emergency.  Safety protocols were in place, including screening questions prior to the visit, additional usage of staff PPE, and extensive cleaning of exam room while observing appropriate contact time as indicated for disinfecting solutions.

## 2022-02-26 NOTE — Assessment & Plan Note (Signed)
Management per cardiology.  No anginal symptoms or dyspnea at this time.  Stable with current medications.

## 2022-02-26 NOTE — Assessment & Plan Note (Signed)
Blood pressure is well-controlled at this time.  Recommend continuation of current medications for management of hypertension.   

## 2022-03-08 ENCOUNTER — Other Ambulatory Visit: Payer: Self-pay | Admitting: Family Medicine

## 2022-03-11 ENCOUNTER — Ambulatory Visit: Payer: Medicare Other | Admitting: Surgery

## 2022-03-11 DIAGNOSIS — I7143 Infrarenal abdominal aortic aneurysm, without rupture: Secondary | ICD-10-CM

## 2022-03-11 NOTE — Progress Notes (Signed)
Phonevisit to follow up on AAA I do not have the disc to review images and they are not on our system.  I will track down the disc and reschedule the visit

## 2022-03-18 DIAGNOSIS — N289 Disorder of kidney and ureter, unspecified: Secondary | ICD-10-CM | POA: Diagnosis not present

## 2022-03-18 DIAGNOSIS — I5022 Chronic systolic (congestive) heart failure: Secondary | ICD-10-CM | POA: Diagnosis not present

## 2022-03-18 DIAGNOSIS — I447 Left bundle-branch block, unspecified: Secondary | ICD-10-CM | POA: Diagnosis not present

## 2022-03-18 DIAGNOSIS — I10 Essential (primary) hypertension: Secondary | ICD-10-CM | POA: Diagnosis not present

## 2022-03-18 DIAGNOSIS — Z95810 Presence of automatic (implantable) cardiac defibrillator: Secondary | ICD-10-CM | POA: Diagnosis not present

## 2022-03-18 DIAGNOSIS — I714 Abdominal aortic aneurysm, without rupture, unspecified: Secondary | ICD-10-CM | POA: Diagnosis not present

## 2022-03-18 DIAGNOSIS — I429 Cardiomyopathy, unspecified: Secondary | ICD-10-CM | POA: Diagnosis not present

## 2022-03-20 ENCOUNTER — Other Ambulatory Visit: Payer: Self-pay

## 2022-03-20 MED ORDER — ALPRAZOLAM 0.5 MG PO TABS: 0.5000 mg | ORAL_TABLET | Freq: Every day | ORAL | 0 refills | Status: DC

## 2022-03-20 NOTE — Telephone Encounter (Signed)
Pt requesting alprazolam as 15/d refill-  Last visit 02/26/2022 Next appt with Dr. Zigmund Daniel 08/07/2022

## 2022-04-11 ENCOUNTER — Other Ambulatory Visit: Payer: Self-pay | Admitting: Family Medicine

## 2022-04-23 ENCOUNTER — Other Ambulatory Visit: Payer: Self-pay | Admitting: Family Medicine

## 2022-05-04 ENCOUNTER — Other Ambulatory Visit: Payer: Self-pay | Admitting: Family Medicine

## 2022-06-22 ENCOUNTER — Other Ambulatory Visit: Payer: Self-pay | Admitting: Family Medicine

## 2022-07-05 ENCOUNTER — Other Ambulatory Visit: Payer: Self-pay | Admitting: Family Medicine

## 2022-07-15 ENCOUNTER — Ambulatory Visit (INDEPENDENT_AMBULATORY_CARE_PROVIDER_SITE_OTHER): Payer: Medicare Other | Admitting: Family Medicine

## 2022-07-15 DIAGNOSIS — Z0000 Encounter for general adult medical examination without abnormal findings: Secondary | ICD-10-CM | POA: Diagnosis not present

## 2022-07-15 DIAGNOSIS — Z87891 Personal history of nicotine dependence: Secondary | ICD-10-CM

## 2022-07-15 DIAGNOSIS — Z1211 Encounter for screening for malignant neoplasm of colon: Secondary | ICD-10-CM

## 2022-07-15 NOTE — Patient Instructions (Addendum)
[place] Maintenance Summary and Written Plan of Care  [name] ,  Thank you for allowing me to perform your Medicare Annual Wellness Visit and for your ongoing commitment to your health.   Health Maintenance & Immunization History Health Maintenance  Topic Date Due   Pneumonia Vaccine 36+ Years old (1 of 2 - PCV) 07/16/2023 (Originally 12/12/1956)   COLONOSCOPY (Pts 45-44yr Insurance coverage will need to be confirmed)  07/16/2023 (Originally 12/11/1997)   Hepatitis C Screening  07/16/2023 (Originally 12/12/1970)   Medicare Annual Wellness (AWV)  07/16/2023   DTaP/Tdap/Td (2 - Td or Tdap) 11/20/2027   HPV VACCINES  Aged Out   INFLUENZA VACCINE  Discontinued   COVID-19 Vaccine  Discontinued   Zoster Vaccines- Shingrix  Discontinued   Immunization History  Administered Date(s) Administered   Tdap 11/19/2017    These are the patient goals that we discussed:  Goals Addressed               This Visit's Progress     Patient Stated (pt-stated)        Patient stated that he would like to maintain his currently healthy lifestyle.         This is a list of Health Maintenance Items that are overdue or due now: Pneumococcal vaccine  Influenza vaccine Colorectal cancer screening Shingrix vaccine  Patient declined the vaccines at this time.    Orders/Referrals Placed Today: Orders Placed This Encounter  Procedures   Cologuard   Ambulatory Referral Lung Cancer Screening [facility]    Referral Priority:   Routine    Referral Type:   Consultation    Referral Reason:   Specialty Services Required    Number of Visits Requested:   1   (Contact our referral department at [phone] you have not spoken with someone about your referral appointment within the next 5 days)    Follow-up Plan Follow-up with MLuetta Nutting DO as planned Medicare wellness visit in one year. AVS printed and mailed to the patient.      Health  Maintenance, Male Adopting a healthy lifestyle and getting preventive care are important in promoting health and wellness. Ask your health care provider about: The right schedule for you to have regular tests and exams. Things you can do on your own to prevent diseases and keep yourself healthy. What should I know about diet, weight, and exercise? Eat a healthy diet  Eat a diet that includes plenty of vegetables, fruits, low-fat dairy products, and lean protein. Do not eat a lot of foods that are high in solid fats, added sugars, or sodium. Maintain a healthy weight Body mass index (BMI) is a measurement that can be used to identify possible weight problems. It estimates body fat based on height and weight. Your health care provider can help determine your BMI and help you achieve or maintain a healthy weight. Get regular exercise Get regular exercise. This is one of the most important things you can do for your health. Most adults should: Exercise for at least 150 minutes each week. The exercise should increase your heart rate and make you sweat (moderate-intensity exercise). Do strengthening exercises at least twice a week. This is in addition to the moderate-intensity exercise. Spend less time sitting. Even light physical activity can be beneficial. Watch cholesterol and blood lipids Have your blood tested for lipids and cholesterol at 70years of age, then have this test every 5 years. You may need to have  your cholesterol levels checked more often if: Your lipid or cholesterol levels are high. You are older than 70 years of age. You are at high risk for heart disease. What should I know about cancer screening? Many types of cancers can be detected early and may often be prevented. Depending on your health history and family history, you may need to have cancer screening at various ages. This may include screening for: Colorectal cancer. Prostate cancer. Skin cancer. Lung cancer. What  should I know about heart disease, diabetes, and high blood pressure? Blood pressure and heart disease High blood pressure causes heart disease and increases the risk of stroke. This is more likely to develop in people who have high blood pressure readings or are overweight. Talk with your health care provider about your target blood pressure readings. Have your blood pressure checked: Every 3-5 years if you are 70-45 years of age. Every year if you are 70 years old or older. If you are between the ages of 35 and 41 and are a current or former smoker, ask your health care provider if you should have a one-time screening for abdominal aortic aneurysm (AAA). Diabetes Have regular diabetes screenings. This checks your fasting blood sugar level. Have the screening done: Once every three years after age 70 if you are at a normal weight and have a low risk for diabetes. More often and at a younger age if you are overweight or have a high risk for diabetes. What should I know about preventing infection? Hepatitis B If you have a higher risk for hepatitis B, you should be screened for this virus. Talk with your health care provider to find out if you are at risk for hepatitis B infection. Hepatitis C Blood testing is recommended for: Everyone born from [birth year] through 1965. Anyone with known risk factors for hepatitis C. Sexually transmitted infections (STIs) You should be screened each year for STIs, including gonorrhea and chlamydia, if: You are sexually active and are younger than 70 years of age. You are older than 70 years of age and your health care provider tells you that you are at risk for this type of infection. Your sexual activity has changed since you were last screened, and you are at increased risk for chlamydia or gonorrhea. Ask your health care provider if you are at risk. Ask your health care provider about whether you are at high risk for HIV. Your health care provider may recommend a  prescription medicine to help prevent HIV infection. If you choose to take medicine to prevent HIV, you should first get tested for HIV. You should then be tested every 3 months for as long as you are taking the medicine. Follow these instructions at home: Alcohol use Do not drink alcohol if your health care provider tells you not to drink. If you drink alcohol: Limit how much you have to 0-2 drinks a day. Know how much alcohol is in your drink. In the [place] one drink equals one 12 oz bottle of beer (355 mL), one 5 oz glass of wine (148 mL), or one 1 oz glass of hard liquor (44 mL). Lifestyle Do not use any products that contain nicotine or tobacco. These products include cigarettes, chewing tobacco, and vaping devices, such as e-cigarettes. If you need help quitting, ask your health care provider. Do not use street drugs. Do not share needles. Ask your health care provider for help if you need support or information about quitting drugs. General instructions Schedule  regular health, dental, and eye exams. Stay current with your vaccines. Tell your health care provider if: You often feel depressed. You have ever been abused or do not feel safe at home. Summary Adopting a healthy lifestyle and getting preventive care are important in promoting health and wellness. Follow your health care provider's instructions about healthy diet, exercising, and getting tested or screened for diseases. Follow your health care provider's instructions on monitoring your cholesterol and blood pressure. This information is not intended to replace advice given to you by your health care provider. Make sure you discuss any questions you have with your health care provider. Document Revised: 10/09/2020 Document Reviewed: 10/09/2020 Elsevier Patient Education  [place].

## 2022-07-15 NOTE — Progress Notes (Signed)
MEDICARE ANNUAL WELLNESS VISIT  07/15/2022  Telephone Visit Disclaimer This Medicare AWV was conducted by telephone due to national recommendations for restrictions regarding the COVID-19 Pandemic (e.g. social distancing).  I verified, using two identifiers, that I am speaking with Anthony Pierce or their authorized healthcare agent. I discussed the limitations, risks, security, and privacy concerns of performing an evaluation and management service by telephone and the potential availability of an in-person appointment in the future. The patient expressed understanding and agreed to proceed.  Location of Patient: Home Location of Provider (nurse):  in the office  Subjective:    Anthony Pierce is a 72 y.o. male patient of Luetta Nutting, DO who had a Medicare Annual Wellness Visit today via telephone. Khairo is Retired and lives alone. he has 2 children. he reports that he is socially active and does interact with friends/family regularly. he is minimally physically active and enjoys playing the guitar.  Patient Care Team: Luetta Nutting, DO as PCP - General (Family Medicine) Darius Bump, [facility] as Pharmacist (Pharmacist)     07/15/2022    4:07 PM 07/09/2021    2:51 PM 06/14/2020    8:40 AM 12/30/2016    9:26 AM 07/11/2016    1:45 PM 07/11/2016    5:59 AM 07/08/2016    9:47 AM  Advanced Directives  Does Patient Have a Medical Advance Directive? Yes Yes No No  No No  Type of Advance Directive Living will;Healthcare Power of Attorney Living will;Healthcare Power of Attorney       Does patient want to make changes to medical advance directive? No - Patient declined No - Patient declined       Copy of [place] in Chart? No - copy requested No - copy requested       Would patient like information on creating a medical advance directive?   No - Patient declined  No - Patient declined  No - Patient declined    Hospital Utilization Over the Past 12 Months: # of  hospitalizations or ER visits: 0 # of surgeries: 0  Review of Systems    Patient reports that his overall health is better compared to last year.  History obtained from chart review and the patient  Patient Reported Readings (BP, Pulse, CBG, Weight, etc) none  Pain Assessment Pain : 0-10 Pain Score: 5  Pain Type: Chronic pain Pain Location: Neck Pain Descriptors / Indicators: Nagging Pain Onset: More than a month ago Pain Frequency: Intermittent Pain Relieving Factors: voltaren gel  Pain Relieving Factors: voltaren gel  Current Medications & Allergies (verified) Allergies as of 07/15/2022       Reactions   Penicillins Rash, Other (See Comments)   Has patient had a PCN reaction causing immediate rash, facial/tongue/throat swelling, SOB or lightheadedness with hypotension: Yes Has patient had a PCN reaction causing severe rash involving mucus membranes or skin necrosis: No Has patient had a PCN reaction that required hospitalization No Has patient had a PCN reaction occurring within the last 10 years: No If all of the above answers are "NO", then may proceed with Cephalosporin use.        Medication List        Accurate as of July 15, 2022  4:30 PM. If you have any questions, ask your nurse or doctor.          ALPRAZolam 0.5 MG tablet Commonly known as: XANAX TAKE ONE TABLET AT BEDTIME, TAKE ONE TABLET TWICE DAILY  AS NEEDED FOR ANXIETY, USE SPARINGLY.   atorvastatin 40 MG tablet Commonly known as: LIPITOR Take 1 tablet (40 mg total) by mouth daily.   carvedilol 25 MG tablet Commonly known as: COREG TAKE 1 TABLET(25 MG) BY MOUTH TWICE DAILY WITH A MEAL   clopidogrel 75 MG tablet Commonly known as: PLAVIX Take 1 tablet (75 mg total) by mouth daily.   hydrALAZINE 100 MG tablet Commonly known as: APRESOLINE TAKE 1 TABLET(100 MG) BY MOUTH TWICE DAILY   ibuprofen 200 MG tablet Commonly known as: ADVIL Take 400 mg by mouth every 6 (six) hours as needed  for mild pain.   isosorbide mononitrate 30 MG 24 hr tablet Commonly known as: IMDUR Take 1 tablet (30 mg total) by mouth daily.   losartan 100 MG tablet Commonly known as: COZAAR Take 1 tablet (100 mg total) by mouth daily.   meloxicam 15 MG tablet Commonly known as: MOBIC Take 1 tablet (15 mg total) by mouth daily as needed for pain.        History (reviewed): Past Medical History:  Diagnosis Date   Basal cell carcinoma (BCC) of nasal sidewall 05/2021   Left side of his nose   Basal cell carcinoma, hand, right 06/2021   CHF (congestive heart failure) (HCC)    Diverticulosis    Dysrhythmia    heart palpitations infrequent   Hemorrhoids    History of AAA (abdominal aortic aneurysm) repair    History of kidney stones    Hyperlipidemia    Hypertension    Kidney stones    Past Surgical History:  Procedure Laterality Date   ABDOMINAL AORTIC ENDOVASCULAR STENT GRAFT Bilateral 03/27/2016   Procedure: ABDOMINAL AORTIC ENDOVASCULAR STENT GRAFT;  Surgeon: Serafina Mitchell, MD;  Location: [facility] OR;  Service: Vascular;  Laterality: Bilateral;   CARDIAC DEFIBRILLATOR PLACEMENT     COLONSCOPY  AGE 48   LITHOTRIPSY     NEPHROLITHOTOMY Left 07/11/2016   Procedure: NEPHROLITHOTOMY PERCUTANEOUS WITH SURGEON ACESS;  Surgeon: Ardis Hughs, MD;  Location: [facility] ORS;  Service: Urology;  Laterality: Left;   Family History  Problem Relation Age of Onset   Cancer Father        stomach   Social History   Socioeconomic History   Marital status: Divorced    Spouse name: Not on file   Number of children: 2   Years of education: 14   Highest education level: Associate degree: academic program  Occupational History   Occupation: Retired    Comment: Programme researcher, 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 Likes to play the guitar for fun. Tries to walk as much as possible.   Social Determinants of Health   Financial Resource Strain: Low Risk  (07/15/2022)   Overall Financial Resource Strain (CARDIA)    Difficulty of Paying Living Expenses: Not hard at all  Food Insecurity: No Food Insecurity (07/15/2022)   Hunger Vital Sign    Worried About Running Out of Food in the Last  Year: Never true    [employer] in the Last Year: Never true  Transportation Needs: No Transportation Needs (07/15/2022)   PRAPARE - Hydrologist (Medical): No    Lack of Transportation (Non-Medical): No  Physical Activity: Inactive (07/15/2022)   Exercise Vital Sign    Days of Exercise per Week: 0 days    Minutes of Exercise per Session: 0 min  Stress: No Stress Concern Present (07/15/2022)   [place]    Feeling of Stress : Only a little  Social Connections: Moderately Isolated (07/15/2022)   Social Connection and Isolation Panel [NHANES]    Frequency of Communication with Friends and Family: More than three times a week    Frequency of Social Gatherings with Friends and Family: Three times a week    Attends Religious Services: More than 4 times per year    Active Member of Clubs or Organizations: No    Attends Archivist Meetings: Never    Marital Status: Divorced    Activities of Daily Living    07/15/2022    4:09 PM  In your present state of health, do you have any difficulty performing the following activities:  Hearing? 0  Vision? 1  Comment blurred vision sometimes  Difficulty concentrating or making decisions? 0  Walking or climbing stairs? 0  Dressing or bathing? 0  Doing errands, shopping? 0  Preparing  Food and eating ? N  Using the Toilet? N  In the past six months, have you accidently leaked urine? N  Do you have problems with loss of bowel control? N  Managing your Medications? N  Managing your Finances? N  Housekeeping or managing your Housekeeping? N    Patient Education/ Literacy How often do you need to have someone help you when you read instructions, pamphlets, or other written materials from your doctor or pharmacy?: 1 - Never What is the last grade level you completed in school?: associates degree  Exercise Current Exercise Habits: Home exercise routine, Type of exercise: walking, Time (Minutes): 30, Frequency (Times/Week): 5, Weekly Exercise (Minutes/Week): 150, Intensity: Mild, Exercise limited by: None identified  Diet Patient reports consuming 2 meals a day and 0 snack(s) a day Patient reports that his primary diet is: Regular Patient reports that she does have regular access to food.   Depression Screen    07/15/2022    4:07 PM 07/09/2021    2:51 PM 10/20/2020   10:30 AM 06/14/2020    8:46 AM 02/09/2019   10:07 AM 08/07/2017   10:55 AM 05/08/2017    9:34 AM  PHQ 2/9 Scores  PHQ - 2 Score 0 0 0 0 0 1 2  PHQ- 9 Score    0 1 7 5     $ Fall Risk    07/15/2022    4:07 PM 07/09/2021    2:51 PM 03/15/2021    3:49 PM 10/20/2020   10:30 AM 06/14/2020    8:46 AM  Fall Risk   Falls in the past year? 0 0 0 0 0  Number falls in past yr: 0 0 0 0 0  Injury with Fall? 0 0 0 0 0  Risk for fall due to : No Fall Risks No Fall Risks No Fall Risks No Fall Risks No Fall Risks  Follow up Falls evaluation completed Falls evaluation completed Falls evaluation completed Falls evaluation completed Falls evaluation completed     Objective:  Anthony Pierce seemed alert and oriented and he participated appropriately during our telephone visit.  Blood Pressure Weight BMI  BP Readings from Last 3 Encounters:  02/26/22 121/81  02/18/22 (!) 174/106  07/25/21 (!) 143/83   Wt Readings from  Last 3 Encounters:  02/26/22 253 lb (114.8 kg)  02/18/22 253 lb 4.8 oz (114.9 kg)  07/25/21 249 lb (112.9 kg)   BMI Readings from Last 1 Encounters:  02/26/22 30.80 kg/m    *Unable to obtain current vital signs, weight, and BMI due to telephone visit type  Hearing/Vision  Maikel did not seem to have difficulty with hearing/understanding during the telephone conversation Reports that he has had a formal eye exam by an eye care professional within the past year Reports that he has not had a formal hearing evaluation within the past year *Unable to fully assess hearing and vision during telephone visit type  Cognitive Function:    07/15/2022    4:13 PM 07/09/2021    2:56 PM 06/14/2020    8:43 AM  6CIT Screen  What Year? 0 points 0 points 0 points  What month? 0 points 0 points 0 points  What time? 0 points 0 points 0 points  Count back from 20 0 points 0 points 0 points  Months in reverse 0 points 0 points 0 points  Repeat phrase 0 points 0 points 0 points  Total Score 0 points 0 points 0 points   (Normal:0-7, Significant for Dysfunction: >8)  Normal Cognitive Function Screening: Yes   Immunization & Health Maintenance Record Immunization History  Administered Date(s) Administered   Tdap 11/19/2017    Health Maintenance  Topic Date Due   Pneumonia Vaccine 21+ Years old (1 of 2 - PCV) 07/16/2023 (Originally 12/12/1956)   COLONOSCOPY (Pts 45-47yr Insurance coverage will need to be confirmed)  07/16/2023 (Originally 12/11/1997)   Hepatitis C Screening  07/16/2023 (Originally 12/12/1968)   Medicare Annual Wellness (AWV)  07/16/2023   DTaP/Tdap/Td (2 - Td or Tdap) 11/20/2027   HPV VACCINES  Aged Out   INFLUENZA VACCINE  Discontinued   COVID-19 Vaccine  Discontinued   Zoster Vaccines- Shingrix  Discontinued       Assessment  This is a routine wellness examination for RFedEx  Health Maintenance: Due or Overdue There are no preventive care reminders to display for  this patient.   RLinus [place] not need a referral for Community Assistance: Care Management:   no Social Work:    no Prescription Assistance:  no Nutrition/Diabetes Education:  no   Plan:  Personalized Goals  Goals Addressed               This Visit's Progress     Patient Stated (pt-stated)        Patient stated that he would like to maintain his currently healthy lifestyle.       Personalized Health Maintenance & Screening Recommendations  Pneumococcal vaccine  Influenza vaccine Colorectal cancer screening Shingrix vaccine  Patient declined the vaccines at this time.  Lung Cancer Screening Recommended: yes (Low Dose CT Chest recommended if Age 443-80years, 30 pack-year currently smoking OR have quit w/in past 15 years) Hepatitis C Screening recommended: yes HIV Screening recommended: no  Advanced Directives: Written information was not prepared per patient's request.  Referrals & Orders Orders Placed This Encounter  Procedures   Cologuard   [facility]    Follow-up Plan Follow-up with MLuetta Nutting DO as  planned Medicare wellness visit in one year. AVS printed and mailed to the patient.   I have personally reviewed and noted the following in the patient's chart:   Medical and social history Use of alcohol, tobacco or illicit drugs  Current medications and supplements Functional ability and status Nutritional status Physical activity Advanced directives List of other physicians Hospitalizations, surgeries, and ER visits in previous 12 months Vitals Screenings to include cognitive, depression, and falls Referrals and appointments  In addition, I have reviewed and discussed with Anthony Pierce certain preventive protocols, quality metrics, and best practice recommendations. A written personalized care plan for preventive services as well as general preventive health recommendations is available and  can be mailed to the patient at his request.      Tinnie Gens, RN BSN  07/15/2022

## 2022-07-24 ENCOUNTER — Other Ambulatory Visit: Payer: Self-pay | Admitting: Family Medicine

## 2022-07-24 NOTE — Telephone Encounter (Signed)
Last OV: 02/26/22 Next OV: 08/26/21 Last RF: 06/25/22

## 2022-08-04 ENCOUNTER — Other Ambulatory Visit: Payer: Self-pay | Admitting: Family Medicine

## 2022-08-12 ENCOUNTER — Telehealth: Payer: Self-pay | Admitting: Physician Assistant

## 2022-08-12 NOTE — Telephone Encounter (Signed)
Called by EMS to notified of death on 08/12/2022 at 8:52pm. Transported to [place].

## 2022-08-15 ENCOUNTER — Telehealth: Payer: Self-pay | Admitting: Family Medicine

## 2022-08-15 NOTE — Telephone Encounter (Signed)
Death certificate completed to [facility].

## 2022-08-15 NOTE — Telephone Encounter (Signed)
Anthony Pierce called.  Death Certificate for this patient is in the electronic system for you to sign when you can.  The family has requested creamation.

## 2022-08-27 ENCOUNTER — Ambulatory Visit: Payer: Medicare Other | Admitting: Family Medicine

## 2022-09-02 DEATH — deceased

## 2022-10-03 ENCOUNTER — Other Ambulatory Visit: Payer: Self-pay | Admitting: Family Medicine

## 2022-11-02 ENCOUNTER — Other Ambulatory Visit: Payer: Self-pay | Admitting: Family Medicine
